# Patient Record
Sex: Female | Born: 1945 | Race: White | Hispanic: No | Marital: Married | Smoking: Former smoker
Health system: Southern US, Community
[De-identification: ages and names within clinical notes are randomized; demographics above are authoritative.]

## PROBLEM LIST (undated history)

## (undated) DIAGNOSIS — K219 Gastro-esophageal reflux disease without esophagitis: Secondary | ICD-10-CM

## (undated) DIAGNOSIS — B001 Herpesviral vesicular dermatitis: Secondary | ICD-10-CM

## (undated) DIAGNOSIS — M549 Dorsalgia, unspecified: Secondary | ICD-10-CM

## (undated) DIAGNOSIS — E663 Overweight: Secondary | ICD-10-CM

## (undated) DIAGNOSIS — Z789 Other specified health status: Secondary | ICD-10-CM

## (undated) HISTORY — DX: Herpesviral vesicular dermatitis: B00.1

## (undated) HISTORY — PX: TONSILLECTOMY: SUR1361

## (undated) HISTORY — DX: Overweight: E66.3

## (undated) HISTORY — DX: Dorsalgia, unspecified: M54.9

---

## 1986-07-02 HISTORY — PX: BUNIONECTOMY: SHX129

## 1998-04-26 ENCOUNTER — Other Ambulatory Visit: Admission: RE | Admit: 1998-04-26 | Discharge: 1998-04-26 | Payer: Self-pay | Admitting: Obstetrics & Gynecology

## 1998-04-27 ENCOUNTER — Other Ambulatory Visit: Admission: RE | Admit: 1998-04-27 | Discharge: 1998-04-27 | Payer: Self-pay | Admitting: Obstetrics & Gynecology

## 1998-05-24 ENCOUNTER — Ambulatory Visit (HOSPITAL_COMMUNITY): Admission: RE | Admit: 1998-05-24 | Discharge: 1998-05-24 | Payer: Self-pay | Admitting: Obstetrics & Gynecology

## 1999-05-30 ENCOUNTER — Other Ambulatory Visit: Admission: RE | Admit: 1999-05-30 | Discharge: 1999-05-30 | Payer: Self-pay | Admitting: Neurology

## 1999-09-27 ENCOUNTER — Other Ambulatory Visit: Admission: RE | Admit: 1999-09-27 | Discharge: 1999-09-27 | Payer: Self-pay | Admitting: Obstetrics & Gynecology

## 1999-09-27 ENCOUNTER — Encounter (INDEPENDENT_AMBULATORY_CARE_PROVIDER_SITE_OTHER): Payer: Self-pay

## 2000-07-16 ENCOUNTER — Other Ambulatory Visit: Admission: RE | Admit: 2000-07-16 | Discharge: 2000-07-16 | Payer: Self-pay | Admitting: Obstetrics & Gynecology

## 2001-09-08 ENCOUNTER — Other Ambulatory Visit: Admission: RE | Admit: 2001-09-08 | Discharge: 2001-09-08 | Payer: Self-pay | Admitting: Obstetrics & Gynecology

## 2003-03-25 ENCOUNTER — Other Ambulatory Visit: Admission: RE | Admit: 2003-03-25 | Discharge: 2003-03-25 | Payer: Self-pay | Admitting: Obstetrics & Gynecology

## 2004-04-12 ENCOUNTER — Other Ambulatory Visit: Admission: RE | Admit: 2004-04-12 | Discharge: 2004-04-12 | Payer: Self-pay | Admitting: Obstetrics & Gynecology

## 2004-07-02 LAB — HM DEXA SCAN: HM Dexa Scan: NORMAL

## 2005-12-07 ENCOUNTER — Ambulatory Visit (HOSPITAL_COMMUNITY): Admission: RE | Admit: 2005-12-07 | Discharge: 2005-12-07 | Payer: Self-pay | Admitting: Gastroenterology

## 2007-07-03 LAB — HM COLONOSCOPY: HM Colonoscopy: NORMAL

## 2008-07-02 HISTORY — PX: OTHER SURGICAL HISTORY: SHX169

## 2009-08-11 ENCOUNTER — Ambulatory Visit: Payer: Self-pay | Admitting: Family Medicine

## 2009-08-11 DIAGNOSIS — M549 Dorsalgia, unspecified: Secondary | ICD-10-CM | POA: Insufficient documentation

## 2010-04-25 ENCOUNTER — Ambulatory Visit: Payer: Self-pay | Admitting: Family Medicine

## 2010-05-10 ENCOUNTER — Ambulatory Visit: Payer: Self-pay | Admitting: Family Medicine

## 2010-06-28 ENCOUNTER — Ambulatory Visit
Admission: RE | Admit: 2010-06-28 | Discharge: 2010-06-28 | Payer: Self-pay | Source: Home / Self Care | Attending: Family Medicine | Admitting: Family Medicine

## 2010-06-28 ENCOUNTER — Encounter: Payer: Self-pay | Admitting: Family Medicine

## 2010-06-28 DIAGNOSIS — R079 Chest pain, unspecified: Secondary | ICD-10-CM | POA: Insufficient documentation

## 2010-06-29 LAB — CONVERTED CEMR LAB
BUN: 13 mg/dL (ref 6–23)
Basophils Relative: 0.6 % (ref 0.0–3.0)
Bilirubin, Direct: 0.1 mg/dL (ref 0.0–0.3)
Calcium: 9.5 mg/dL (ref 8.4–10.5)
Chloride: 107 meq/L (ref 96–112)
Creatinine, Ser: 0.8 mg/dL (ref 0.4–1.2)
Eosinophils Relative: 2.5 % (ref 0.0–5.0)
GFR calc non Af Amer: 81.97 mL/min (ref >60.00)
HCT: 40 % (ref 36.0–46.0)
HDL: 65.4 mg/dL (ref >39.00)
LDL Cholesterol: 93 mg/dL (ref 0–99)
Lymphs Abs: 1.8 10*3/uL (ref 0.7–4.0)
MCV: 92.7 fL (ref 78.0–100.0)
Monocytes Absolute: 0.6 10*3/uL (ref 0.1–1.0)
Monocytes Relative: 8.7 % (ref 3.0–12.0)
RBC: 4.32 M/uL (ref 3.87–5.11)
TSH: 1.58 microintl units/mL (ref 0.35–5.50)
Total Bilirubin: 0.5 mg/dL (ref 0.3–1.2)
Total CHOL/HDL Ratio: 3
Triglycerides: 120 mg/dL (ref 0.0–149.0)
VLDL: 24 mg/dL (ref 0.0–40.0)
WBC: 6.7 10*3/uL (ref 4.5–10.5)

## 2010-08-03 NOTE — Assessment & Plan Note (Signed)
Summary: TOWER FLU SHOT/[facility]  Nurse Visit   Allergies: 1)  ! Penicillin  Orders Added: 1)  Flu Vaccine 71yrs + MEDICARE PATIENTS [Q2039] 2)  Administration Flu vaccine - MCR [G0008]  Flu Vaccine Consent Questions     Do you have a history of severe allergic reactions to this vaccine? no    Any prior history of allergic reactions to egg and/or gelatin? no    Do you have a sensitivity to the preservative Thimersol? no    Do you have a past history of Guillan-Barre Syndrome? no    Do you currently have an acute febrile illness? no    Have you ever had a severe reaction to latex? no    Vaccine information given and explained to patient? yes    Are you currently pregnant? no    Lot Number:[id number]   Exp Date:12/30/2010   Site Given  Left Deltoid IM1

## 2010-08-03 NOTE — Assessment & Plan Note (Signed)
Summary: ?ACID REFLUX/CLE   Vital Signs:  Patient profile:   65 year old female Weight:      146.25 pounds Temp:     97.9 degrees F oral Pulse rate:   74 / minute Pulse rhythm:   regular BP sitting:   136 / 82  (left arm) Cuff size:   regular  Vitals Entered By: Selena Batten Dance CMA Duncan Dull) (June 28, 2010 8:12 AM) CC: ? Acid Reflux, C-V Risk Management   History of Present Illness: CC: ? acid reflux  3wk h/o sxs.  Feels pain under arm as well as pain down arm.  Constant pain, sleeps fine, doesn't bother her through night.  Pain characterized as dull achey.  Currently 2/10.  At worst, 8/10 and can last  ~5 min.  No nausea/vomiting, shortness of breath, sweating.  Tried omeprazole 20mg  daily x 2wks which hasn't really helped.  Hasn't tried tylenol/ibuprofen, etc.  usually pain gets worse with coffee, EtOH.  Not really exertional, not improved by rest.  Prior had similar sxs about 6 yrs ago, told had reflux about 8 yrs ago, usually when stops drinking coffee goes away, this time not.    occasional EtOH.  Over last week, had more wine because went to nightly parties.  No coffee.    Used to run [employer].  told low cholesterol.  bp usually low.  + grandfather died of heart attack at age 82s.  no h/o smoking.  does stay active, goes to gym 3x/wk.  no pain with exercising.  UTD mammo, had one normal this year per [facility].  Cardiovascular Risk History:      Positive major cardiovascular risk factors include female age 36 years old or older.  Negative major cardiovascular risk factors include no history of diabetes or hyperlipidemia, no history of hypertension, negative family history for ischemic heart disease, and non-tobacco-user status.        Further assessment for target organ damage reveals no history of cardiac end-organ damage (CHF/LVH), stroke/TIA, renal insufficiency, or hypertensive retinopathy.     Preventive Screening-Counseling & Management  Alcohol-Tobacco     Smoking Status:  never  Current Medications (verified): 1)  Multivitamins   Tabs (Multiple Vitamin) .... Take One Daily 2)  Vitamin D 1000 Iu .Marland Kitchen.. 1 By Mouth Once Daily  Allergies: 1)  ! Penicillin  Past History:  Past Medical History: Last updated: 08/11/2009 fever blisters  overweight back pain from large breasts     gyn - Dr Aldona Bar   Social History: Last updated: 08/11/2009 married retired Runner, broadcasting/film/video  G1P1 menopause in 2000 used to run marathons in her 102s  goes to gym with a Systems analyst and goes to classes  never smoked  occ alcohol - wine   Family History: mother asthma  died at 43 of lung problems / old age  father died at 13 - brain tumor  no other cancer  MGF died of MI  no DM   Social History: Smoking Status:  never  Review of Systems       per HPI  Physical Exam  General:  Well-developed,well-nourished,in no acute distress; alert,appropriate and cooperative throughout examination Mouth:  pharynx pink and moist.   some post nasal drip no erythema or exudate Neck:  No deformities, masses, or tenderness noted.  no LAD Chest Wall:  No deformities, masses, or tenderness noted.  not reproducible tenderness Lungs:  Normal respiratory effort, chest expands symmetrically. Lungs are clear to auscultation, no crackles or wheezes. Heart:  Normal rate  and regular rhythm. S1 and S2 normal without gallop, murmur, click, rub or other extra sounds. Abdomen:  Bowel sounds positive,abdomen soft and non-tender without masses, organomegaly or hernias noted.  Pulses:  2+ rad pulses, brisk cap refill Extremities:  no pedal edema Skin:  Intact without suspicious lesions or rashes   Impression & Recommendations:  Problem # 1:  CHEST PAIN UNSPECIFIED (ICD-786.50) Actually R axillary pain characterized as dullness.  will check basic blood work to help risk stratify.  doesn't sound cardiac, only risk factor seems to be age.  ? GERD as worse with EtOH.  treat with omeprazole 40mg  two  times a day x 2 wks as previously responded to reflux measures.  reflux precautions discussed.  Advised to call us with update in 2-3 days for response to therapy.  If not improved, refer to cards for stress test.  EKG - NSR at 67, no ST/T changes.  Orders: EKG w/ Interpretation (93000) TLB-Lipid Panel (80061-LIPID) TLB-BMP (Basic Metabolic Panel-BMET) (80048-METABOL) TLB-Hepatic/Liver Function Pnl (80076-HEPATIC) TLB-TSH (Thyroid Stimulating Hormone) (84443-TSH) TLB-CBC Platelet - w/Differential (85025-CBCD)  Complete Medication List: 1)  Multivitamins Tabs (Multiple vitamin) .... Take one daily 2)  Vitamin D 1000 Iu  .Marland Kitchen.. 1 by mouth once daily 3)  Omeprazole 40 Mg Cpdr (Omeprazole) .... Take one by mouth two times a day x 14 days then as needed  Cardiovascular Risk Assessment/Plan:      The patient's hypertensive risk group is category B: At least one risk factor (excluding diabetes) with no target organ damage.  Today's blood pressure is 136/82.  Her blood pressure goal is < 140/90.  Patient Instructions: 1)  This doesn't sound cardiac.  2)  Treat with omeprazole 40mg  twice daily for 2 wks. 3)  Call us with update in 2-3 days if no improvement we will send you to heart doctor. 4)  Reflux precautions:  Head of bed elevated.  Avoidance of citrus, fatty foods, chocolate, peppermint, and excessive alcohol, along with sodas, orange juice (acidic drinks). 5)  At least a few hours between dinner and bed, minimize naps after eating. 6)  Also would try some tylenol to see if any improvement. 7)  Good to meet you today.  Call clinic with questions. Prescriptions: OMEPRAZOLE 40 MG CPDR (OMEPRAZOLE) take one by mouth two times a day x 14 days then as needed  #30 x 1   Entered and Authorized by:   Eustaquio Boyden  MD   Signed by:   Eustaquio Boyden  MD on 06/28/2010   Method used:   Electronically to        [facility] [address] [phone]* (retail)       [address]       [place], [place]  [id number]       Ph: [phone] or [phone]       Fax: [phone]   RxID:   [id number]    Orders Added: 1)  Est. Patient Level IV [40347] 2)  EKG w/ Interpretation [93000] 3)  TLB-Lipid Panel [80061-LIPID] 4)  TLB-BMP (Basic Metabolic Panel-BMET) [80048-METABOL] 5)  TLB-Hepatic/Liver Function Pnl [80076-HEPATIC] 6)  TLB-TSH (Thyroid Stimulating Hormone) [84443-TSH] 7)  TLB-CBC Platelet - w/Differential [85025-CBCD]    Current Allergies (reviewed today): ! PENICILLIN

## 2010-08-03 NOTE — Assessment & Plan Note (Signed)
Summary: cold symptoms   Vital Signs:  Patient profile:   65 year old female Weight:      145 pounds BMI:     28.42 Temp:     98.0 degrees F oral Pulse rate:   80 / minute Pulse rhythm:   regular BP sitting:   130 / 80  (left arm) Cuff size:   regular  Vitals Entered By: Selena Batten Dance CMA Duncan Dull) (April 25, 2010 12:34 PM) CC: Cold symptoms   History of Present Illness: has had cough for over a week -- and is worried because is worse no energy  today a bit better - but yesterday was awful   clear prod cough  no fever  no sob orwheeze   runny nose and stuffy  started wtih st -- better now     Allergies: 1)  ! Penicillin  Past History:  Past Medical History: Last updated: 16-Aug-2009 fever blisters  overweight back pain from large breasts     gyn - Dr Aldona Bar   Past Surgical History: Last updated: 08/16/09 bunion surgery 1988 colon screen 2009-- all normal - re check in 10 years  cataract surgery 2010 dexa 2006 - normal   Family History: Last updated: August 16, 2009 mother asthma  died at 29 of lung problems / old age  father died at 27 - brain tumor  no other cancer  GM died of MI  no DM   Social History: Last updated: 16-Aug-2009 married retired Runner, broadcasting/film/video  G1P1 menopause in 2000 used to run marathons in her 14s  goes to gym with a Systems analyst and goes to classes  never smoked  occ alcohol - wine   Review of Systems General:  Complains of fatigue; denies chills, fever, loss of appetite, and malaise. Eyes:  Denies blurring, discharge, and eye irritation. ENT:  Complains of nasal congestion, postnasal drainage, and sore throat; denies earache and sinus pressure. CV:  Denies chest pain or discomfort and palpitations. Resp:  Complains of cough and sputum productive; denies pleuritic and shortness of breath. GI:  Denies diarrhea, nausea, and vomiting. Derm:  Denies itching, lesion(s), poor wound healing, and rash. Neuro:  Denies numbness and  tingling.  Physical Exam  General:  Well-developed,well-nourished,in no acute distress; alert,appropriate and cooperative throughout examination Head:  normocephalic, atraumatic, and no abnormalities observed.  no sinus tenderness  Eyes:  vision grossly intact, pupils equal, pupils round, and pupils reactive to light.  no conjunctival pallor, injection or icterus  Ears:  R ear normal and L ear normal.   Nose:  nares are boggy and partially congested  Mouth:  pharynx pink and moist.   some post nasal drip no erythema or exudate Neck:  No deformities, masses, or tenderness noted. Lungs:  Normal respiratory effort, chest expands symmetrically. Lungs are clear to auscultation, no crackles or wheezes. Heart:  Normal rate and regular rhythm. S1 and S2 normal without gallop, murmur, click, rub or other extra sounds. Skin:  Intact without suspicious lesions or rashes Cervical Nodes:  No lymphadenopathy noted Psych:  normal affect, talkative and pleasant    Impression & Recommendations:  Problem # 1:  URI (ICD-465.9) Assessment New viral with head and chest congestion - no red flags for bact infx an dB9 exam recommend sympt care- see pt instructions   pt advised to update me if symptoms worsen or do not improve   Complete Medication List: 1)  Multivitamins Tabs (Multiple vitamin) .... Take one daily 2)  Vitamin D 1000 Iu  .Marland KitchenMarland KitchenMarland Kitchen  1 by mouth once daily  Patient Instructions: 1)  you can try mucinex over the counter twice daily as directed and nasal saline spray for congestion 2)  tylenol over the counter as directed may help with aches, headache and fever 3)  call if symptoms worsen or if not improved in 4-5 days  4)  if you develop worse cough/ fever/ shortness of breath- update me please  5)  get a flu shot in about 2 weeks    Orders Added: 1)  Est. Patient Level III [32440]    Current Allergies (reviewed today): ! PENICILLIN

## 2010-08-03 NOTE — Assessment & Plan Note (Signed)
Summary: NEW MEDICARE PATIENT/[facility]   Vital Signs:  Patient profile:   65 year old female Height:      60 inches Weight:      147.25 pounds BMI:     28.86 Temp:     97.6 degrees F oral Pulse rate:   76 / minute Pulse rhythm:   regular BP sitting:   116 / 86  (left arm) Cuff size:   regular  Vitals Entered By: Lewanda Rife LPN (09/09/2009 11:04 AM)  History of Present Illness: is here to establish  used to see Dr Thane Edu for primary care   gyn Dr Aldona Bar -- last march  nl pap   thinks Td was 20 years ago  needs pneumovax - wants to get today   is unsure if she had chicken pox   never had shingles shot  may be interested   used to run marathons in 30s and 40 s no asthma or allergies    takes mvi daily  extra vit D   does get blood work from her gyn  really low cholesterol   wants to loose 10 lb  bothers her back to walk -- large breasts  thinking about breast reduction surgery in future when she looses wt- breast size never changes getting bigger with age shoulders and upper and lower back ache  Allergies (verified): 1)  ! Penicillin  Past History:  Family History: Last updated: 09-09-2009 mother asthma  died at 32 of lung problems / old age  father died at 37 - brain tumor  no other cancer  GM died of MI  no DM   Social History: Last updated: September 09, 2009 married retired Runner, broadcasting/film/video  G1P1 menopause in 2000 used to run marathons in her 50s  goes to gym with a Systems analyst and goes to classes  never smoked  occ alcohol - wine   Past Medical History: fever blisters  overweight back pain from large breasts     gyn - Dr Aldona Bar   Past Surgical History: bunion surgery 1988 colon screen 2009-- all normal - re check in 10 years  cataract surgery 2010 dexa 2006 - normal   Family History: mother asthma  died at 45 of lung problems / old age  father died at 34 - brain tumor  no other cancer  GM died of MI  no DM   Social  History: married retired Runner, broadcasting/film/video  G1P1 menopause in 2000 used to run marathons in her 55s  goes to gym with a Systems analyst and goes to classes  never smoked  occ alcohol - wine   Review of Systems General:  Denies fatigue, fever, loss of appetite, and malaise. Eyes:  Denies blurring and eye irritation. CV:  Denies chest pain or discomfort and lightheadness. Resp:  Denies cough and wheezing. GI:  Denies abdominal pain, bloody stools, and change in bowel habits. GU:  Denies abnormal vaginal bleeding, discharge, and dysuria. MS:  Complains of low back pain, mid back pain, stiffness, and thoracic pain; denies joint redness, joint swelling, and cramps. Derm:  Denies itching, lesion(s), poor wound healing, and rash. Neuro:  Denies numbness, tingling, and weakness. Psych:  mood is ok . Endo:  Denies excessive thirst and excessive urination. Heme:  Denies abnormal bruising and bleeding.  Physical Exam  General:  overweight but generally well appearing  Head:  normocephalic, atraumatic, and no abnormalities observed.   Eyes:  vision grossly intact, pupils equal, pupils round, and pupils reactive to light.  Ears:  R ear normal and L ear normal.   Nose:  no nasal discharge.   Mouth:  pharynx pink and moist.   Neck:  supple with full rom and no masses or thyromegally, no JVD or carotid bruit  Chest Wall:  No deformities, masses, or tenderness noted. Lungs:  Normal respiratory effort, chest expands symmetrically. Lungs are clear to auscultation, no crackles or wheezes. Heart:  Normal rate and regular rhythm. S1 and S2 normal without gallop, murmur, click, rub or other extra sounds. Abdomen:  Bowel sounds positive,abdomen soft and non-tender without masses, organomegaly or hernias noted. Msk:  No deformity or scoliosis noted of thoracic or lumbar spine.  no acute joint changes  LS - no spinal tenderness good rom  Pulses:  R and L carotid,radial,femoral,dorsalis pedis and posterior  tibial pulses are full and equal bilaterally Extremities:  No clubbing, cyanosis, edema, or deformity noted with normal full range of motion of all joints.   Neurologic:  sensation intact to light touch, gait normal, and DTRs symmetrical and normal.   Skin:  Intact without suspicious lesions or rashes Psych:  normal affect, talkative and pleasant    Impression & Recommendations:  Problem # 1:  BACK PAIN (ICD-724.5) Assessment New in physically fit pt -- with large breasts that affect her posture plans to work on weight loss and then consider breast reduction surgery when she is at target weight  this would help quite a bit disc plan for diet and exercise - including core strengthening   Problem # 2:  Preventive Health Care (ICD-V70.0) reviewed med history and prev mt  overall is up to date on her health mt I do recommend calcium and vit D for bone health will either f/u here in spring or at her gyn for annual exam  sent for last labs and note   Complete Medication List: 1)  Multivitamins Tabs (Multiple vitamin) .... Take one daily 2)  Vitamin D 1000 Iu  .Marland Kitchen.. 1 by mouth once daily  Other Orders: TD Toxoids IM 7 YR + (26948) Pneumococcal Vaccine (54627) Admin 1st Vaccine (03500) Admin of Any Addtl Vaccine (93818)  Patient Instructions: 1)  if you are interested in shingles vaccine -- check with your insurance and then call us in summer to schedule  2)  the current recommendation for calcium intake is 1200-1500 mg daily with1000 IU of vitamin D  3)  work on weight loss- I recommend weight watchers  4)  tetnus shot and pneumonia vaccines today  5)  please send for last gyn note and labs from Dr Aldona Bar   Current Allergies (reviewed today): ! PENICILLIN   Preventive Care Screening  Colonoscopy:    Date:  07/03/2007    Next Due:  07/2017    Results:  normal   Mammogram:    Date:  09/10/2008    Results:  normal   Pap Smear:    Date:  08/30/2008    Results:  normal    Bone Density:    Date:  07/02/2004    Results:  normal std dev   Immunizations Administered:  Tetanus Vaccine:    Vaccine Type: Td    Site: left deltoid    Mfr: Sanofi Pasteur    Dose: 0.5 ml    Route: IM    Given by: Lewanda Rife LPN    Exp. Date: 05/17/2011    Lot #: [id number]    VIS given: 05/20/07 version given August 11, 2009.  Pneumonia Vaccine:  Vaccine Type: Pneumovax    Site: right deltoid    Mfr: Merck    Dose: 0.5 ml    Route: IM    Given by: Lewanda Rife LPN    Exp. Date: 10/20/2010    Lot #: [id number]    VIS given: 01/28/96 version given August 11, 2009.

## 2010-11-06 ENCOUNTER — Ambulatory Visit (INDEPENDENT_AMBULATORY_CARE_PROVIDER_SITE_OTHER): Payer: Medicare Other | Admitting: Family Medicine

## 2010-11-06 ENCOUNTER — Encounter: Payer: Self-pay | Admitting: Family Medicine

## 2010-11-06 ENCOUNTER — Ambulatory Visit (INDEPENDENT_AMBULATORY_CARE_PROVIDER_SITE_OTHER)
Admission: RE | Admit: 2010-11-06 | Discharge: 2010-11-06 | Disposition: A | Discharge status: Home / Self Care | Payer: Medicare Other | Source: Ambulatory Visit | Attending: Family Medicine | Admitting: Family Medicine

## 2010-11-06 DIAGNOSIS — M25569 Pain in unspecified knee: Secondary | ICD-10-CM

## 2010-11-06 DIAGNOSIS — M25561 Pain in right knee: Secondary | ICD-10-CM | POA: Insufficient documentation

## 2010-11-06 DIAGNOSIS — M7040 Prepatellar bursitis, unspecified knee: Secondary | ICD-10-CM | POA: Insufficient documentation

## 2010-11-06 NOTE — Progress Notes (Signed)
  Subjective:    Patient ID: Bailey Stephens, female    DOB: 1946-05-01, 65 y.o.   MRN: [id number]  HPI Comments: No history of  knee surgery.  She was a runner in past .occasionally has pain in knee with walking down stairs in past but has never had to see and MD about it.   No history of osteoporosis.. Last DEXA 4 years ago.  Knee Pain  The incident occurred more than 1 week ago (swelling returned 4 days ago). The incident occurred at home (tripped on stool going into house). The injury mechanism was a direct blow. The pain is present in the right knee. The quality of the pain is described as shooting (Initially iced knee...improved pain but after she went back to regualr eactivities she had swelling return). The pain is at a severity of 3/10 (Pain only occurs with touching not with moving /bending). The pain is mild. Pain course: only to palpation. Pertinent negatives include no inability to bear weight, loss of motion, loss of sensation, muscle weakness, numbness or tingling. She has tried ice, NSAIDs and rest for the symptoms. The treatment provided moderate relief.      Review of Systems  Constitutional: Negative for fever and fatigue.  HENT: Negative for ear pain.   Eyes: Negative for pain.  Respiratory: Negative for shortness of breath and wheezing.   Cardiovascular: Negative for chest pain, palpitations and leg swelling.  Gastrointestinal: Negative for abdominal pain, diarrhea, constipation and blood in stool.  Musculoskeletal: Negative for myalgias, back pain and gait problem.  Neurological: Negative for tingling and numbness.       Objective:   Physical Exam  Constitutional: She appears well-developed and well-nourished. No distress.  HENT:  Head: Normocephalic and atraumatic.  Right Ear: External ear normal.  Left Ear: External ear normal.  Nose: Nose normal.  Mouth/Throat: Oropharynx is clear and moist.  Eyes: Pupils are equal, round, and reactive to light.  Neck:  Normal range of motion. Neck supple.  Cardiovascular: Normal rate, regular rhythm, normal heart sounds and intact distal pulses.  Exam reveals no gallop and no friction rub.   No murmur heard. Pulmonary/Chest: Effort normal and breath sounds normal. No respiratory distress. She has no wheezes. She has no rales. She exhibits no tenderness.  Musculoskeletal: Normal range of motion. She exhibits edema and tenderness.       Right knee: She exhibits swelling, effusion and deformity. She exhibits normal range of motion, no erythema, normal patellar mobility and normal meniscus. tenderness found. No medial joint line, no lateral joint line, no MCL and no LCL tenderness noted.       Left knee: Normal.       Swelling in prepatellar region...deformity in anterior knee due to baseballsize,soft, fluid filled area anterior to patella Minimal pain to palpation Neg McMurray's, neg ant/post drawer  Skin: She is not diaphoretic.          Assessment & Plan:

## 2010-11-06 NOTE — Assessment & Plan Note (Signed)
X-ray shows no suggestion pf fracture.  Physical exam is most consistent with prepatellar bursitis.Fluid appears to be above patella as opposed to intra-articular. Bursitis likely due to direct blow to bursa.. No suggestion of infection or gout. She will start NSAIDs, Ice and elvation of leg. She will return for aspiration of bursa, fluid analysis and possibel steroid injection with Dr. Patsy Lager in 2 days when he is next in the office.

## 2010-11-06 NOTE — Patient Instructions (Signed)
Start aleve every 12 hours.  Elevate right leg above heart when able.  Ice knee.  We will call you with x-ray results.

## 2010-11-08 ENCOUNTER — Ambulatory Visit (INDEPENDENT_AMBULATORY_CARE_PROVIDER_SITE_OTHER): Payer: Medicare Other | Admitting: Family Medicine

## 2010-11-08 ENCOUNTER — Encounter: Payer: Self-pay | Admitting: Family Medicine

## 2010-11-08 DIAGNOSIS — M7040 Prepatellar bursitis, unspecified knee: Secondary | ICD-10-CM

## 2010-11-08 DIAGNOSIS — M25561 Pain in right knee: Secondary | ICD-10-CM

## 2010-11-08 DIAGNOSIS — M25569 Pain in unspecified knee: Secondary | ICD-10-CM

## 2010-11-08 NOTE — Progress Notes (Signed)
Patient presents with 10 day h/o R swelling after no occult injury that she can recall. No audible pop was heard. The patient has a massive prepatellar effusion. No symptomatic giving-way. No mechanical clicking. Joint has not locked up. Patient has been able to walk. The patient does not have pain going up and down stairs or rising from a seated position.   Pain location: no pain Prior Knee Surgery: none Current pain meds: none Bracing: none  The PMH, PSH, Social History, Family History, Medications, and allergies have been reviewed in [facility], and have been updated if relevant.  GEN: Well-developed,well-nourished,in no acute distress; alert,appropriate and cooperative throughout examination HEENT: Normocephalic and atraumatic without obvious abnormalities. Ears, externally no deformities PULM: Breathing comfortably in no respiratory distress EXT: No clubbing, cyanosis, or edema PSYCH: Normally interactive. Cooperative during the interview. Pleasant. Friendly and conversant. Not anxious or depressed appearing. Normal, full affect.  Gait: Normal heel toe pattern ROM: 0 - 120 Effusion: massive prepatellar vs sq effusion prepatellar bursa and anterior knee Echymosis or edema: none Patellar tendon NT Painful PLICA: neg Patellar grind: negative Medial and lateral patellar facet loading: negative medial and lateral joint lines:NT Mcmurray's neg Flexion-pinch neg Varus and valgus stress: stable Lachman: neg Ant and Post drawer: neg Hip abduction, IR, ER: WNL Hip flexion str: 5/5 Hip abd: 5/5 Quad: 5/5 VMO atrophy:No Hamstring concentric and eccentric: 5/5  A/P: Knee Pain, Massive prepatellar bursa. Given the volume of fluid, blood associated, I suspect that the patient probably ruptured her prepatellar bursa. 200 cc of fluid is over and above what can be expected in the prepatellar bursa, and fluid throughout anterior soft tissue.  Knee Aspiration and Injection Patient verbally  consented; risks, benefits, and alternatives explained. Patient prepped with betadine x 3. Ethyl chloride for anesthesia. 18 gauge needle used via lateral approach in prepatellar bursa to aspirate 200 cc of bloody fluid. No subsequent injection performed. Tolerated well, decreased pain, no complications.  Placed in ACE wrap for compression Call immediately if redness worsens.   Sent fluid for cell count, gram stain, culture, synovial panel.

## 2010-11-08 NOTE — Patient Instructions (Signed)
Wear a NEOPRENE SLEEVE (NO HOLE) WEAR ALL THE TIME EXCEPT BATHING FOR 1 WEEK, THEN WEAR WITH ACTIVITY  ICE TO knee over the next few days

## 2010-11-12 LAB — BODY FLUID CULTURE: Organism ID, Bacteria: NO GROWTH

## 2010-11-19 ENCOUNTER — Inpatient Hospital Stay (INDEPENDENT_AMBULATORY_CARE_PROVIDER_SITE_OTHER)
Admission: RE | Admit: 2010-11-19 | Discharge: 2010-11-19 | Disposition: A | Discharge status: Home / Self Care | Payer: Medicare Other | Source: Ambulatory Visit | Attending: Emergency Medicine | Admitting: Emergency Medicine

## 2010-11-19 DIAGNOSIS — L255 Unspecified contact dermatitis due to plants, except food: Secondary | ICD-10-CM

## 2011-01-18 ENCOUNTER — Other Ambulatory Visit: Payer: Self-pay | Admitting: Family Medicine

## 2011-01-18 DIAGNOSIS — Z1231 Encounter for screening mammogram for malignant neoplasm of breast: Secondary | ICD-10-CM

## 2011-01-26 ENCOUNTER — Ambulatory Visit
Admission: RE | Admit: 2011-01-26 | Discharge: 2011-01-26 | Disposition: A | Discharge status: Home / Self Care | Payer: Medicare Other | Source: Ambulatory Visit | Attending: Family Medicine | Admitting: Family Medicine

## 2011-01-26 DIAGNOSIS — Z1231 Encounter for screening mammogram for malignant neoplasm of breast: Secondary | ICD-10-CM

## 2011-01-26 LAB — HM MAMMOGRAPHY: HM Mammogram: NORMAL

## 2011-02-10 ENCOUNTER — Telehealth: Payer: Self-pay | Admitting: Family Medicine

## 2011-02-10 DIAGNOSIS — Z0000 Encounter for general adult medical examination without abnormal findings: Secondary | ICD-10-CM

## 2011-02-10 DIAGNOSIS — Z131 Encounter for screening for diabetes mellitus: Secondary | ICD-10-CM

## 2011-02-10 DIAGNOSIS — Z13220 Encounter for screening for lipoid disorders: Secondary | ICD-10-CM

## 2011-02-10 DIAGNOSIS — K219 Gastro-esophageal reflux disease without esophagitis: Secondary | ICD-10-CM

## 2011-02-10 NOTE — Telephone Encounter (Signed)
Message copied by Judy Pimple on Sat Feb 10, 2011 10:05 PM ------      Message from: Bailey Stephens      Created: Wed Feb 07, 2011 11:01 AM      Regarding: cpx labs wed 8/15       Please order  future cpx labs for pt's upcomming lab appt.      Thanks      Rodney Booze

## 2011-02-10 NOTE — Telephone Encounter (Signed)
I was going to order future labs on this pt for wed 8/15- but ? If she has been abstracted  Could you check on that?-thanks

## 2011-02-12 ENCOUNTER — Encounter: Payer: Self-pay | Admitting: Family Medicine

## 2011-02-12 ENCOUNTER — Telehealth: Payer: Self-pay

## 2011-02-12 DIAGNOSIS — Z13220 Encounter for screening for lipoid disorders: Secondary | ICD-10-CM | POA: Insufficient documentation

## 2011-02-12 DIAGNOSIS — Z0000 Encounter for general adult medical examination without abnormal findings: Secondary | ICD-10-CM | POA: Insufficient documentation

## 2011-02-12 DIAGNOSIS — Z131 Encounter for screening for diabetes mellitus: Secondary | ICD-10-CM | POA: Insufficient documentation

## 2011-02-12 DIAGNOSIS — K219 Gastro-esophageal reflux disease without esophagitis: Secondary | ICD-10-CM | POA: Insufficient documentation

## 2011-02-12 NOTE — Telephone Encounter (Signed)
I did abstraction this AM. Thank you.

## 2011-02-12 NOTE — Telephone Encounter (Signed)
Patient notified as instructed by telephone. Pt said she would like to wait until her CPX 03/02/11 to discuss with Dr Milinda Antis. Lab appt was cancelled.

## 2011-02-12 NOTE — Telephone Encounter (Signed)
Message copied by Patience Musca on Mon Feb 12, 2011  3:50 PM ------      Message from: Roxy Manns A      Created: Mon Feb 12, 2011  1:49 PM       Please see note on her regarding future lab order -- it would not let me route message to you       Bottom line is -- it does not look like her insurance will pay for wellness labs as I tried to order them       ? Does she want to still do them and pay out of pocket or talk about it at our visit )      I was going to do cmet, cbc with diff, tsh and lipids      thanks

## 2011-02-12 NOTE — Telephone Encounter (Signed)
Please let pt know that I am trying to order regular wellness labs for her next visit - but they are being shown as not covered by her insurance Perhaps we should talk about it before we do her labs (or maybe she wants to pay out of pocket) Please ask her what she thinks - and I went ahead and removed the lab orders for now

## 2011-02-14 ENCOUNTER — Other Ambulatory Visit: Payer: Medicare Other

## 2011-02-21 ENCOUNTER — Encounter: Payer: Medicare Other | Admitting: Family Medicine

## 2011-03-02 ENCOUNTER — Encounter: Payer: Medicare Other | Admitting: Family Medicine

## 2011-05-01 ENCOUNTER — Ambulatory Visit (INDEPENDENT_AMBULATORY_CARE_PROVIDER_SITE_OTHER): Payer: Medicare Other | Admitting: Family Medicine

## 2011-05-01 ENCOUNTER — Encounter: Payer: Self-pay | Admitting: Family Medicine

## 2011-05-01 VITALS — BP 126/82 | HR 80 | Temp 98.1°F | Ht 62.0 in | Wt 138.5 lb

## 2011-05-01 DIAGNOSIS — N952 Postmenopausal atrophic vaginitis: Secondary | ICD-10-CM | POA: Insufficient documentation

## 2011-05-01 DIAGNOSIS — N899 Noninflammatory disorder of vagina, unspecified: Secondary | ICD-10-CM

## 2011-05-01 DIAGNOSIS — R229 Localized swelling, mass and lump, unspecified: Secondary | ICD-10-CM

## 2011-05-01 DIAGNOSIS — Z13220 Encounter for screening for lipoid disorders: Secondary | ICD-10-CM

## 2011-05-01 DIAGNOSIS — N898 Other specified noninflammatory disorders of vagina: Secondary | ICD-10-CM

## 2011-05-01 DIAGNOSIS — Z131 Encounter for screening for diabetes mellitus: Secondary | ICD-10-CM

## 2011-05-01 DIAGNOSIS — Z23 Encounter for immunization: Secondary | ICD-10-CM

## 2011-05-01 DIAGNOSIS — R2230 Localized swelling, mass and lump, unspecified upper limb: Secondary | ICD-10-CM

## 2011-05-01 DIAGNOSIS — K219 Gastro-esophageal reflux disease without esophagitis: Secondary | ICD-10-CM

## 2011-05-01 MED ORDER — ESTRADIOL 0.1 MG/GM VA CREA: TOPICAL_CREAM | VAGINAL | Status: DC

## 2011-05-01 NOTE — Assessment & Plan Note (Signed)
Reviewed very good cholesterol from last dec Disc goals for lipids and reasons to control them Rev labs with pt Rev low sat fat diet in detail

## 2011-05-01 NOTE — Patient Instructions (Addendum)
If you are interested in zoster vaccine (shingles) , call your insurance company about coverage, then call our office and tell us what pharmacy to send the px for the vaccine to (do not get within 1 month of other vaccines)  Flu shot today  Will do a px for estrace cream for atrophic vaginitis  We will do a hand specialist referral at check out for the lump on your thumb  Keep up the great diet and exercise  Schedule non fasting labs for January  Try to get 1200-1500 mg of calcium per day with at least 1000 iu of vitamin D - for bone health

## 2011-05-01 NOTE — Assessment & Plan Note (Signed)
Unsure if poss cyst vs scar tissue or other mass on R thumb No hx of fb Ref to hand specialist for further eval

## 2011-05-01 NOTE — Assessment & Plan Note (Signed)
Nl sugar in last labs Will re check this in labs in January Good diet and exercise

## 2011-05-01 NOTE — Progress Notes (Signed)
Subjective:    Patient ID: Bailey Stephens, female    DOB: [birth year], 65 y.o.   MRN: [id number]  HPI Here for annual check up of chronic medical problems and also to review her health mt list   Has had some vaginal irritation -- unsure if related to soap No discharge - occ itch - not severe  Going on about 3-4 weeks  Does not use douche - does use lubricants occasionally  Used to use a vaginal cream -- ran out of that - used twice per week  Also strange knot on R thumb   Since last visit- fell on knee and had to have a prepatellar bursitis drained by Dr Patsy Lager  Is just about better now  Also had a bad poison ivy attack   Good bp at 126/82  Wt is down 9 lb with good bmi of 25 Is thrilled with that  Went on the [employer] - not that difficult  Only drinks alcohol - 1 drink on sat nt Gained 3 lb on a cruise    Last labs in 12/11 were good  Lipoid screen  Lab Results  Component Value Date   CHOL 182 06/28/2010   Lab Results  Component Value Date   HDL 65.40 06/28/2010   Lab Results  Component Value Date   LDLCALC 93 06/28/2010   Lab Results  Component Value Date   TRIG 120.0 06/28/2010   Lab Results  Component Value Date   CHOLHDL 3 06/28/2010   No results found for this basename: LDLDIRECT   good LDL and HDL  Diet - is excellent - and proud of that  Eats cereal hi fiber/ or 1 egg/ whole wheat toast Pita- onions / pepper  Roasted peppers and asparagus - with olive oil  Eats some hummus and flank steak occas/ mostly chicken  Lot of fish   Exercise- sticks with that - [employer] 3 days per week and walks 3 miles daily also  Used to run marathons   Mam nl 7/12 normal  Self exam- no lumps or changes   Zoster status- has not had disease or vaccine  Unsure if she had chicken pox    GERD- on prilosec  Flu shot- will get that today  Td was 2/11 PTx was 2011  Pap 2010- was normal  No abn paps in 30 years  No new sexual partners    dexa  06-- was normal  Is taking her ca and vit D   Patient Active Problem List  Diagnoses  . BACK PAIN  . CHEST PAIN UNSPECIFIED  . Right knee pain  . Prepatellar bursitis  . Routine general medical examination at a health care facility  . Screening for lipoid disorders  . Diabetes mellitus screening  . GERD (gastroesophageal reflux disease)  . Lump on finger  . Vaginal irritation   Past Medical History  Diagnosis Date  . Fever blister   . Overweight   . Back pain     due to large breasts   Past Surgical History  Procedure Date  . Bunionectomy 1988  . Cataract surgery 2010   History  Substance Use Topics  . Smoking status: Former Games developer  . Smokeless tobacco: Never Used   Comment: quit 1976  . Alcohol Use: Yes     occasional wine/alcohol   Family History  Problem Relation Age of Onset  . Asthma Mother   . Cancer Father     brain tumor  . Heart disease  Maternal Grandfather     MI   Allergies  Allergen Reactions  . Penicillins     REACTION: Pt said not sure if she was allergic  to penicillin. Pt said 40 + years ago was told allergic to penicillin. Not sure what type reaction.   Current Outpatient Prescriptions on File Prior to Visit  Medication Sig Dispense Refill  . Cholecalciferol (VITAMIN D) 1000 UNITS capsule Take 1,000 Units by mouth daily.        . Multiple Vitamin (MULTIVITAMIN) tablet Take 1 tablet by mouth daily.        Marland Kitchen omeprazole (PRILOSEC) 40 MG capsule Take 40 mg by mouth daily as needed.              Review of Systems Review of Systems  Constitutional: Negative for fever, appetite change, fatigue and unexpected weight change.  Eyes: Negative for pain and visual disturbance.  Respiratory: Negative for cough and shortness of breath.   Cardiovascular: Negative for cp or palpitations    Gastrointestinal: Negative for nausea, diarrhea and constipation.  Genitourinary: Negative for urgency and frequency. pos for vaginal irritation but neg for  discharge  Skin: Negative for pallor or rash   Neurological: Negative for weakness, light-headedness, numbness and headaches.  Hematological: Negative for adenopathy. Does not bruise/bleed easily.  Psychiatric/Behavioral: Negative for dysphoric mood. The patient is not nervous/anxious.          Objective:   Physical Exam  Constitutional: She appears well-developed and well-nourished. No distress.  HENT:  Head: Normocephalic and atraumatic.  Right Ear: External ear normal.  Left Ear: External ear normal.  Nose: Nose normal.  Mouth/Throat: Oropharynx is clear and moist.  Eyes: Conjunctivae and EOM are normal. Pupils are equal, round, and reactive to light. No scleral icterus.  Neck: Normal range of motion. Neck supple. No JVD present. Carotid bruit is not present. No thyromegaly present.  Cardiovascular: Normal rate, regular rhythm, normal heart sounds and intact distal pulses.  Exam reveals no gallop.   Pulmonary/Chest: Effort normal and breath sounds normal. No respiratory distress. She has no wheezes.  Abdominal: Soft. Bowel sounds are normal. She exhibits no distension, no abdominal bruit and no mass. There is no tenderness.  Genitourinary: Vagina normal and uterus normal. No breast swelling, tenderness, discharge or bleeding. No vaginal discharge found.       Atrophic/ dry vaginal mucosa noted Wet prep obt- negative   Musculoskeletal: Normal range of motion. She exhibits no edema and no tenderness.  Lymphadenopathy:    She has no cervical adenopathy.  Neurological: She is alert. She has normal reflexes. No cranial nerve deficit. She exhibits normal muscle tone. Coordination normal.  Skin: Skin is warm and dry. No rash noted. No erythema. No pallor.       Firm lump that is slightly mobile near tip of L thumb nontender No erythema or drainage   Psychiatric: She has a normal mood and affect.          Assessment & Plan:

## 2011-05-01 NOTE — Assessment & Plan Note (Signed)
Overall improved - with ppi just prn  Disc diet  Will check cbc at [facility] lab appt

## 2011-05-01 NOTE — Assessment & Plan Note (Signed)
Nl wet prep today Suspect atrophic vaginitis Exam done without pap  Will tx with estrace cream twice weekly and update

## 2011-05-02 ENCOUNTER — Encounter: Payer: Medicare Other | Admitting: Family Medicine

## 2011-05-31 ENCOUNTER — Other Ambulatory Visit: Payer: Self-pay | Admitting: Orthopedic Surgery

## 2011-06-07 ENCOUNTER — Encounter (HOSPITAL_BASED_OUTPATIENT_CLINIC_OR_DEPARTMENT_OTHER): Payer: Self-pay | Admitting: *Deleted

## 2011-06-07 NOTE — Progress Notes (Signed)
Had ekg dr tower 10/12 No labs needed Arrive 615am

## 2011-06-08 ENCOUNTER — Encounter (HOSPITAL_BASED_OUTPATIENT_CLINIC_OR_DEPARTMENT_OTHER): Payer: Self-pay | Admitting: *Deleted

## 2011-06-08 ENCOUNTER — Ambulatory Visit (HOSPITAL_BASED_OUTPATIENT_CLINIC_OR_DEPARTMENT_OTHER)
Admission: RE | Admit: 2011-06-08 | Discharge: 2011-06-08 | Disposition: A | Discharge status: Home Health | Payer: Medicare Other | Source: Ambulatory Visit | Attending: Orthopedic Surgery | Admitting: Orthopedic Surgery

## 2011-06-08 ENCOUNTER — Encounter (HOSPITAL_BASED_OUTPATIENT_CLINIC_OR_DEPARTMENT_OTHER): Payer: Self-pay | Admitting: Certified Registered"

## 2011-06-08 ENCOUNTER — Other Ambulatory Visit: Payer: Self-pay | Admitting: Orthopedic Surgery

## 2011-06-08 ENCOUNTER — Encounter (HOSPITAL_BASED_OUTPATIENT_CLINIC_OR_DEPARTMENT_OTHER): Payer: Self-pay | Admitting: Orthopedic Surgery

## 2011-06-08 ENCOUNTER — Encounter (HOSPITAL_BASED_OUTPATIENT_CLINIC_OR_DEPARTMENT_OTHER)
Admission: RE | Disposition: A | Discharge status: Home Health | Payer: Self-pay | Source: Ambulatory Visit | Attending: Orthopedic Surgery

## 2011-06-08 ENCOUNTER — Ambulatory Visit (HOSPITAL_BASED_OUTPATIENT_CLINIC_OR_DEPARTMENT_OTHER): Payer: Medicare Other | Admitting: Certified Registered"

## 2011-06-08 DIAGNOSIS — K219 Gastro-esophageal reflux disease without esophagitis: Secondary | ICD-10-CM | POA: Insufficient documentation

## 2011-06-08 DIAGNOSIS — D2110 Benign neoplasm of connective and other soft tissue of unspecified upper limb, including shoulder: Secondary | ICD-10-CM | POA: Insufficient documentation

## 2011-06-08 HISTORY — DX: Other specified health status: Z78.9

## 2011-06-08 HISTORY — DX: Gastro-esophageal reflux disease without esophagitis: K21.9

## 2011-06-08 HISTORY — PX: MASS EXCISION: SHX2000

## 2011-06-08 LAB — POCT HEMOGLOBIN-HEMACUE: Hemoglobin: 13.5 g/dL (ref 12.0–15.0)

## 2011-06-08 SURGERY — EXCISION MASS
Anesthesia: Monitor Anesthesia Care | Laterality: Right

## 2011-06-08 MED ORDER — PROMETHAZINE HCL 25 MG/ML IJ SOLN: 6.2500 mg | INTRAMUSCULAR | Status: DC | PRN

## 2011-06-08 MED ORDER — LACTATED RINGERS IV SOLN
INTRAVENOUS | Status: DC
  Administered 2011-06-08: 07:00:00 via INTRAVENOUS

## 2011-06-08 MED ORDER — MIDAZOLAM HCL 5 MG/5ML IJ SOLN
INTRAMUSCULAR | Status: DC | PRN
  Administered 2011-06-08: 1 mg via INTRAVENOUS

## 2011-06-08 MED ORDER — TRAMADOL HCL 50 MG PO TABS: ORAL_TABLET | ORAL | Status: AC

## 2011-06-08 MED ORDER — MEPERIDINE HCL 25 MG/ML IJ SOLN: 6.2500 mg | INTRAMUSCULAR | Status: DC | PRN

## 2011-06-08 MED ORDER — LIDOCAINE HCL 2 % IJ SOLN
INTRAMUSCULAR | Status: DC | PRN
  Administered 2011-06-08: 3.5 mL

## 2011-06-08 MED ORDER — PROPOFOL 10 MG/ML IV EMUL
INTRAVENOUS | Status: DC | PRN
  Administered 2011-06-08: 40 ug/kg/min via INTRAVENOUS

## 2011-06-08 MED ORDER — HYDROMORPHONE HCL PF 1 MG/ML IJ SOLN: 0.2500 mg | INTRAMUSCULAR | Status: DC | PRN

## 2011-06-08 MED ORDER — CHLORHEXIDINE GLUCONATE 4 % EX LIQD: 60.0000 mL | Freq: Once | CUTANEOUS | Status: DC

## 2011-06-08 MED ORDER — ONDANSETRON HCL 4 MG/2ML IJ SOLN
INTRAMUSCULAR | Status: DC | PRN
  Administered 2011-06-08: 4 mg via INTRAVENOUS

## 2011-06-08 MED ORDER — VANCOMYCIN HCL IN DEXTROSE 1-5 GM/200ML-% IV SOLN: 1000.0000 mg | Freq: Once | INTRAVENOUS | Status: DC

## 2011-06-08 MED ORDER — MIDAZOLAM HCL 2 MG/2ML IJ SOLN: 0.5000 mg | Freq: Once | INTRAMUSCULAR | Status: DC | PRN

## 2011-06-08 MED ORDER — MORPHINE SULFATE 2 MG/ML IJ SOLN: 0.0500 mg/kg | INTRAMUSCULAR | Status: DC | PRN

## 2011-06-08 MED ORDER — FENTANYL CITRATE 0.05 MG/ML IJ SOLN
INTRAMUSCULAR | Status: DC | PRN
  Administered 2011-06-08: 50 ug via INTRAVENOUS

## 2011-06-08 SURGICAL SUPPLY — 50 items
BANDAGE ADHESIVE 1X3 (GAUZE/BANDAGES/DRESSINGS) IMPLANT
BANDAGE ELASTIC 3 VELCRO ST LF (GAUZE/BANDAGES/DRESSINGS) IMPLANT
BLADE MINI RND TIP GREEN BEAV (BLADE) IMPLANT
BLADE SURG 15 STRL LF DISP TIS (BLADE) ×1 IMPLANT
BLADE SURG 15 STRL SS (BLADE) ×2
BNDG CMPR 9X4 STRL LF SNTH (GAUZE/BANDAGES/DRESSINGS)
BNDG CMPR MD 5X2 ELC HKLP STRL (GAUZE/BANDAGES/DRESSINGS)
BNDG COHESIVE 1X5 TAN STRL LF (GAUZE/BANDAGES/DRESSINGS) ×2 IMPLANT
BNDG ELASTIC 2 VLCR STRL LF (GAUZE/BANDAGES/DRESSINGS) IMPLANT
BNDG ESMARK 4X9 LF (GAUZE/BANDAGES/DRESSINGS) IMPLANT
BRUSH SCRUB EZ PLAIN DRY (MISCELLANEOUS) ×2 IMPLANT
CLOTH BEACON ORANGE TIMEOUT ST (SAFETY) ×2 IMPLANT
CORDS BIPOLAR (ELECTRODE) IMPLANT
COVER MAYO STAND STRL (DRAPES) ×2 IMPLANT
COVER TABLE BACK 60X90 (DRAPES) ×2 IMPLANT
CUFF TOURNIQUET SINGLE 18IN (TOURNIQUET CUFF) IMPLANT
DECANTER SPIKE VIAL GLASS SM (MISCELLANEOUS) IMPLANT
DRAIN PENROSE 1/2X12 LTX STRL (WOUND CARE) IMPLANT
DRAIN PENROSE 1/4X12 LTX STRL (WOUND CARE) IMPLANT
DRAPE EXTREMITY T 121X128X90 (DRAPE) ×2 IMPLANT
DRAPE SURG 17X23 STRL (DRAPES) ×2 IMPLANT
GAUZE XEROFORM 1X8 LF (GAUZE/BANDAGES/DRESSINGS) IMPLANT
GLOVE BIOGEL PI IND STRL 8 (GLOVE) ×1 IMPLANT
GLOVE BIOGEL PI INDICATOR 8 (GLOVE) ×1
GLOVE ORTHO TXT STRL SZ7.5 (GLOVE) ×2 IMPLANT
GOWN PREVENTION PLUS XLARGE (GOWN DISPOSABLE) ×2 IMPLANT
GOWN STRL REIN XL XLG (GOWN DISPOSABLE) ×4 IMPLANT
NDL BLUNT 17GA (NEEDLE) ×1 IMPLANT
NEEDLE 27GAX1X1/2 (NEEDLE) IMPLANT
NEEDLE BLUNT 17GA (NEEDLE) ×2 IMPLANT
PACK BASIN DAY SURGERY FS (CUSTOM PROCEDURE TRAY) ×2 IMPLANT
PAD CAST 3X4 CTTN HI CHSV (CAST SUPPLIES) IMPLANT
PADDING CAST COTTON 3X4 STRL (CAST SUPPLIES)
PADDING UNDERCAST 2  STERILE (CAST SUPPLIES) IMPLANT
SPLINT PLASTER CAST XFAST 3X15 (CAST SUPPLIES) IMPLANT
SPLINT PLASTER XTRA FASTSET 3X (CAST SUPPLIES)
SPONGE GAUZE 4X4 12PLY (GAUZE/BANDAGES/DRESSINGS) IMPLANT
STOCKINETTE 4X48 STRL (DRAPES) ×2 IMPLANT
STRIP CLOSURE SKIN 1/2X4 (GAUZE/BANDAGES/DRESSINGS) IMPLANT
SUT ETHILON 5 0 P 3 18 (SUTURE) ×1
SUT MERSILENE 4 0 P 3 (SUTURE) IMPLANT
SUT NYLON ETHILON 5-0 P-3 1X18 (SUTURE) ×1 IMPLANT
SUT PROLENE 3 0 PS 2 (SUTURE) IMPLANT
SYR 20CC LL (SYRINGE) ×2 IMPLANT
SYR 3ML 23GX1 SAFETY (SYRINGE) IMPLANT
SYR CONTROL 10ML LL (SYRINGE) IMPLANT
TOWEL OR 17X24 6PK STRL BLUE (TOWEL DISPOSABLE) ×2 IMPLANT
TRAY DSU PREP LF (CUSTOM PROCEDURE TRAY) ×2 IMPLANT
UNDERPAD 30X30 INCONTINENT (UNDERPADS AND DIAPERS) ×2 IMPLANT
WATER STERILE IRR 1000ML POUR (IV SOLUTION) IMPLANT

## 2011-06-08 NOTE — Op Note (Signed)
OP NOTE DICTATED 06/08/11 [id number]

## 2011-06-08 NOTE — Op Note (Signed)
NAME:  Bailey Stephens, Bailey Stephens NO.:  MEDICAL RECORD NO.:  [id number]  LOCATION:                                 FACILITY:  PHYSICIAN:  Bailey Fitch. Tyjanae Bartek, M.D.      DATE OF BIRTH:  DATE OF PROCEDURE:  06/08/2011 DATE OF DISCHARGE:                              OPERATIVE REPORT   PREOPERATIVE DIAGNOSIS:  Osteocartilaginous mass, right thumb, rule out osteochondroma or chondroma.  PREOPERATIVE DIAGNOSIS:  Osteocartilaginous mass, right thumb, rule out osteochondroma or chondroma.  OPERATION:  Resection of right thumb distal phalangeal osteocartilaginous mass with reduction of the thumb skin.  OPERATING SURGEON:  Bailey Fitch. Ahrianna Siglin, MD  ASSISTANT:  Marveen Reeks Dasnoit, PAC  ANESTHESIA:  2% lidocaine, metacarpal head level block, right thumb, supplemented by IV sedation.  SUPERVISING ANESTHESIOLOGIST:  Judie Petit, MD  INDICATIONS:  Bailey Stephens is a 65 year old woman referred for evaluation and management of a painful mass in the pulp of her thumb. Clinical examination revealed a firm nodule that could represent an epidermal inclusion cyst or possible osteochondroma.  Plain x-rays revealed a turret exostosis of the distal phalanx, suggesting that this was an osteochondroma.  Bailey Stephens requested that we remove this so that she would have less uncomfortable pinch.  We advised that we could proceed with excision for diagnosis and hopeful resolution of her problem.  Risks of surgery include potential infection, failure to relieve all her pain, and the possibility she could develop recurrent mass.  The anticipated outcome of surgery would be decreased mass and decreased discomfort.  She will have a sensitive thumb pulp for period of time due dissection and retraction of the digital nerves.  After informed consent, she was brought to the operating room at this time.  PROCEDURE:  Bailey Stephens was brought to room 1 of the [facility] and  placed in supine position on the operating table.  Following anesthesia and informed consent, sedation was provided, followed by Betadine prep of the thumb.  A 3.5 mL of 2% lidocaine were infiltrated at metacarpal head level to obtain a digital block.  When anesthesia was satisfactory, we proceeded to scrub and paint the right upper extremity with Betadine and apply a pneumatic tourniquet to the proximal right brachium.  Following routine surgical time-out, the arm was exsanguinated with Esmarch bandage and the arterial tourniquet was inflated to 220 mmHg. Procedure commenced with an elliptical skin excision of the mass to prevent redundant skin following removal of the mass, which acted as a tissue expander.  Subcutaneous tissue dissection revealed terminal branches of the ulnar propositional nerve, which were gently dissected off the mass with a Therapist, nutritional.  The base of the mass was identified and a 4-mm osteotome was used to amputate the mass off the distal phalanx proper.  A small rongeur was used to smooth the margins of resection.  The mass was osteocartilaginous.  This was placed in formalin for pathologic evaluation.  We will await the results of a detailed pathologic evaluation prior to signing a final diagnosis.  The wound was then repaired with trauma sutures of 5-0 nylon.  A compressive dressing was applied with Xeroflo, sterile  gauze, and Coban. There were no apparent complications.  Note for aftercare, she is provided tramadol 50 mg 1-2 tablets p.o. q.4- 6 h. p.r.n. pain, 20 tablets without refill.     Bailey Stephens, M.D.     RVS/MEDQ  D:  06/08/2011  T:  06/08/2011  Job:  [id number]

## 2011-06-08 NOTE — Brief Op Note (Signed)
06/08/2011  8:09 AM  PATIENT:  Vira Browns  65 y.o. 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

## 2011-06-08 NOTE — H&P (Signed)
  May 30, 2011     Roxy Manns, MD [address] [place], [place] [address] FAX # [phone]  RE: Makilah Dowda  DOB:   [birth year]  MEDICARE   Dear Idamae Schuller:  Thanks for sending Vito Backers for consult regarding the mass on the ulnar aspect of her right thumb pulp.  Millie noted this bump four months prior.  She has no prior history of penetrating injury.  She is an avid gardener. She is a retired history Runner, broadcasting/film/video at [employer].  She now presents for an upper extremity evaluation.    Her past medical history is reviewed in detail.  She is 65 years-old.  She is 5'2" tal and weighs 135 pounds. She has not used any medication for discomfort.  Her past history reveals that she is allergic to penicillin.  She is on no routine medications.    Prior surgery, C-section in 1983, bunions bilaterally 1988, cataract removal right eye in 2010.    Social history reveals that she is married, she is a nonsmoker, she enjoys a rare alcoholic beverage.    Family history detailed and noncontributory.    14-point review of systems reveals history of cataract removal from the right eye in 2010 with lens implant.   Physical examination reveals a well appearing 65 year-old woman. Inspection of her thumb reveals a 13x10 mm.  mass on the ulnar aspect of her right thumb. This is firm. There is some callus formation in the skin overlying the bump. She has full range of motion of her IP and MP joints.  Her neurovascular examination is intact.   Four views of her thumb demonstrate a bony exostosis deep to the bump.   ASSESSMENT:  Probable exostosis right thumb distal phalanx with secondary soft tissue hypertrophy.  I have advised her to proceed with excisional biopsy under regional anesthesia at a mutually convenient time.  The surgery and aftercare were described in detail.  We will keep you informed of her progress.   As always thanks for the privilege of seeing your patients.   Best  regards,    Katy Fitch. Sypher, M.D., Montez Hageman.  RVS:cmf T:  11.30.12

## 2011-06-08 NOTE — Anesthesia Postprocedure Evaluation (Signed)
  Anesthesia Post-op Note  Patient: Bailey Stephens  Procedure(s) Performed:  EXCISION MASS - excisional biopsy of exostosis right thumb  Patient Location: PACU  Anesthesia Type: General  Level of Consciousness: awake  Airway and Oxygen Therapy: Patient Spontanous Breathing  Post-op Pain: mild  Post-op Assessment: Post-op Vital signs reviewed  Post-op Vital Signs: stable  Complications: No apparent anesthesia complications

## 2011-06-08 NOTE — Anesthesia Preprocedure Evaluation (Addendum)
Anesthesia Evaluation  Patient identified by MRN, date of birth, ID band  Reviewed: Allergy & Precautions  Airway Mallampati: II      Dental  (+) Dental Advisory Given   Pulmonary neg pulmonary ROS,          Cardiovascular neg cardio ROS Regular Normal    Neuro/Psych Negative Neurological ROS     GI/Hepatic Neg liver ROS, GERD-  ,  Endo/Other  Negative Endocrine ROS  Renal/GU negative Renal ROS     Musculoskeletal negative musculoskeletal ROS (+)   Abdominal   Peds  Hematology negative hematology ROS (+)   Anesthesia Other Findings   Reproductive/Obstetrics                           Anesthesia Physical Anesthesia Plan  ASA: II  Anesthesia Plan: MAC   Post-op Pain Management:    Induction: Intravenous  Airway Management Planned: LMA  Additional Equipment:   Intra-op Plan:   Post-operative Plan:   Informed Consent: I have reviewed the patients History and Physical, chart, labs and discussed the procedure including the risks, benefits and alternatives for the proposed anesthesia with the patient or authorized representative who has indicated his/her understanding and acceptance.   Dental advisory given  Plan Discussed with:   Anesthesia Plan Comments:        Anesthesia Quick Evaluation

## 2011-06-08 NOTE — Transfer of Care (Signed)
Immediate Anesthesia Transfer of Care Note  Patient: [name]  Procedure(s) Performed:  EXCISION MASS - excisional biopsy of exostosis right thumb  Patient Location: PACU  Anesthesia Type: MAC  Level of Consciousness: awake, alert , oriented and patient cooperative  Airway & Oxygen Therapy: Patient Spontanous Breathing  Post-op Assessment: Report given to PACU RN and Post -op Vital signs reviewed and stable  Post vital signs: Reviewed and stable  Complications: No apparent anesthesia complications

## 2011-06-08 NOTE — Anesthesia Procedure Notes (Addendum)
Date/Time: 06/08/2011 7:52 AM Performed by: Radford Pax Pre-anesthesia Checklist: Patient identified, Emergency Drugs available, Suction available, Patient being monitored and Timeout performed Patient Re-evaluated:Patient Re-evaluated prior to inductionOxygen Delivery Method: Simple face mask Placement Confirmation: positive ETCO2 and breath sounds checked- equal and bilateral

## 2011-06-08 NOTE — H&P (Signed)
Bailey Stephens is an 65 y.o. female.   Chief Complaint: Complaining of mass ulnar aspect right thumb pulp. HPI: Patient is a 65 year old right-hand-dominant female who presented to our office recently complaining of a mass on the ulnar aspect of her right thumb pulp. The mass has been present for approximately 4-5 months. She relates no history of trauma to the thumb she is an avid gardener. She presents now to our office for further evaluation and treatment.  Past Medical History  Diagnosis Date  . Fever blister   . Overweight   . Back pain     due to large breasts  . No pertinent past medical history   . GERD (gastroesophageal reflux disease)     Past Surgical History  Procedure Date  . Bunionectomy 1988  . Cataract surgery 2010  . Cesarean section   . Tonsillectomy     Family History  Problem Relation Age of Onset  . Asthma Mother   . Cancer Father     brain tumor  . Heart disease Maternal Grandfather     MI   Social History:  reports that she quit smoking about 38 years ago. She has never used smokeless tobacco. She reports that she drinks alcohol. She reports that she does not use illicit drugs.  Allergies:  Allergies  Allergen Reactions  . Penicillins     REACTION: Pt said not sure if she was allergic  to penicillin. Pt said 40 + years ago was told allergic to penicillin. Not sure what type reaction.    Medications Prior to Admission  Medication Dose Route Frequency Provider Last Rate Last Dose  . chlorhexidine (HIBICLENS) 4 % liquid 4 application  60 mL Topical Once       . lactated ringers infusion   Intravenous Continuous Zenon Mayo, MD 20 mL/hr at 06/08/11 0719    . vancomycin (VANCOCIN) IVPB 1000 mg/200 mL premix  1,000 mg Intravenous Once        Medications Prior to Admission  Medication Sig Dispense Refill  . Cholecalciferol (VITAMIN D) 1000 UNITS capsule Take 1,000 Units by mouth daily.        Marland Kitchen estradiol (ESTRACE) 0.1 MG/GM vaginal cream  Use 1-2 cm in applicator intravaginally twice a week  42.5 g  3  . Multiple Vitamin (MULTIVITAMIN) tablet Take 1 tablet by mouth daily.        Marland Kitchen omeprazole (PRILOSEC) 40 MG capsule Take 40 mg by mouth daily as needed.          No results found for this or any previous visit (from the past 48 hour(s)).  No results found.   Pertinent items are noted in HPI.  Blood pressure 141/80, pulse 63, temperature 97.8 F (36.6 C), temperature source Oral, resp. rate 16, height 5\' 2"  (1.575 m), weight 58.06 kg (128 lb), SpO2 97.00%.  General appearance: alert Head: Normocephalic, without obvious abnormality Neck: supple, symmetrical, trachea midline Resp: clear to auscultation bilaterally Cardio: regular rate and rhythm, S1, S2 normal, no murmur, click, rub or gallop GI: normal findings: bowel sounds normal Extremities: Inspection of her right thumb reveals a firm mass on the ulnar aspect of the right thumb pulp. This does not appear to be infected vascular she is intact x-ray examination reveals a bony exostosis below the mass. Pulses: 2+ and symmetric Skin: normal Neurologic: Grossly normal    Assessment/Plan Impression: Bony exostosis with soft tissue hypertrophy right thumb ulnar aspect of her pulp  Plan: Patient be taken  to the operating room to undergo excision of the bony exostosis and debridement of the soft tissue hypertrophy the procedure and postoperative course were discussed with patient at length and she was in agreement with this plan.  DASNOIT,Lynnleigh Soden J 06/08/2011, 7:20 AM    H&P documentation: 06/08/2011  -History and Physical Reviewed  -Patient has been re-examined  -No change in the plan of care  Wyn Forster, MD

## 2011-06-08 NOTE — Progress Notes (Signed)
Pt's dressing fell off by pt. Getting dressed.  Dressing reapplied with sterile technique with sterile 4x4, sterile gloves  and coban outer dressing. Pt tolerated well. NAD

## 2011-06-12 ENCOUNTER — Encounter (HOSPITAL_BASED_OUTPATIENT_CLINIC_OR_DEPARTMENT_OTHER): Payer: Self-pay | Admitting: Orthopedic Surgery

## 2011-06-27 ENCOUNTER — Encounter: Payer: Self-pay | Admitting: Family Medicine

## 2011-06-27 ENCOUNTER — Ambulatory Visit (INDEPENDENT_AMBULATORY_CARE_PROVIDER_SITE_OTHER): Payer: Medicare Other | Admitting: Family Medicine

## 2011-06-27 VITALS — BP 148/88 | HR 60 | Temp 97.7°F | Ht 62.0 in | Wt 128.0 lb

## 2011-06-27 DIAGNOSIS — B354 Tinea corporis: Secondary | ICD-10-CM

## 2011-06-27 MED ORDER — KETOCONAZOLE 2 % EX CREA: TOPICAL_CREAM | Freq: Two times a day (BID) | CUTANEOUS | Status: DC

## 2011-06-27 NOTE — Assessment & Plan Note (Signed)
Area in R groin looks like ringworm- with discrete edge of redness and scale / central clearing inst in care- see AVS Ketoconazole cream and update if not imp

## 2011-06-27 NOTE — Progress Notes (Signed)
Subjective:    Patient ID: Bailey Stephens, female    DOB: Oct 02, 1945, 66 y.o.   MRN: [id number]  HPI Has area of irritation/ infection in groin area on R  Started as itchy area - maybe 1 mo ago  Then she had thumb operated on to remove tumor -that went well  Then last week she noted raw dark spot  Not draining  Is red- getting bigger  Not painful -itches on and off  No fever   Patient Active Problem List  Diagnoses  . Routine general medical examination at a health care facility  . Screening for lipoid disorders  . Diabetes mellitus screening  . GERD (gastroesophageal reflux disease)  . Lump on finger  . Vaginal irritation  . Tinea corporis   Past Medical History  Diagnosis Date  . Fever blister   . Overweight   . Back pain     due to large breasts  . No pertinent past medical history   . GERD (gastroesophageal reflux disease)    Past Surgical History  Procedure Date  . Bunionectomy 1988  . Cataract surgery 2010  . Cesarean section   . Tonsillectomy   . Mass excision 06/08/2011    Procedure: EXCISION MASS;  Surgeon: Wyn Forster., MD;  Location: [facility];  Service: Orthopedics;  Laterality: Right;  excisional biopsy of exostosis right thumb   History  Substance Use Topics  . Smoking status: Former Smoker    Quit date: 06/06/1973  . Smokeless tobacco: Never Used   Comment: quit 1976  . Alcohol Use: Yes     occasional wine/alcohol   Family History  Problem Relation Age of Onset  . Asthma Mother   . Cancer Father     brain tumor  . Heart disease Maternal Grandfather     MI   Allergies  Allergen Reactions  . Penicillins     REACTION: Pt said not sure if she was allergic  to penicillin. Pt said 40 + years ago was told allergic to penicillin. Not sure what type reaction.   Current Outpatient Prescriptions on File Prior to Visit  Medication Sig Dispense Refill  . Cholecalciferol (VITAMIN D) 1000 UNITS capsule Take 1,000 Units by mouth  daily.        Marland Kitchen estradiol (ESTRACE) 0.1 MG/GM vaginal cream Use 1-2 cm in applicator intravaginally twice a week  42.5 g  3  . Multiple Vitamin (MULTIVITAMIN) tablet Take 1 tablet by mouth daily.        Marland Kitchen omeprazole (PRILOSEC) 40 MG capsule Take 40 mg by mouth daily as needed.             Review of Systems Review of Systems  Constitutional: Negative for fever, appetite change, fatigue and unexpected weight change.  Eyes: Negative for pain and visual disturbance.  Respiratory: Negative for cough and shortness of breath.   Cardiovascular: Negative for cp or palpitations    Gastrointestinal: Negative for nausea, diarrhea and constipation.  Genitourinary: Negative for urgency and frequency.  Skin: Negative for pallor , pos for itchy rash  Neurological: Negative for weakness, light-headedness, numbness and headaches.  Hematological: Negative for adenopathy. Does not bruise/bleed easily.  Psychiatric/Behavioral: Negative for dysphoric mood. The patient is not nervous/anxious.          Objective:   Physical Exam  Constitutional: She appears well-developed and well-nourished. No distress.  Eyes: Conjunctivae and EOM are normal. Pupils are equal, round, and reactive to light.  Neck: Normal  range of motion. Neck supple.  Cardiovascular: Normal rate, regular rhythm and normal heart sounds.   Pulmonary/Chest: Effort normal and breath sounds normal. No respiratory distress. She has no wheezes.  Abdominal: Soft. Bowel sounds are normal. She exhibits no distension. There is no tenderness.  Lymphadenopathy:    She has no cervical adenopathy.  Neurological: She is alert.  Skin: Skin is warm and dry. Rash noted. There is erythema.       R groin area 2-3 cm of erythema with sharp edge and scale with some central clearing  Scale is diffuse  Resembles tinea/ ringworm Few areas of excoriation No evidence of abcess or bactereal infection    Psychiatric: She has a normal mood and affect.           Assessment & Plan:

## 2011-06-27 NOTE — Patient Instructions (Signed)
I think you have a fungal infection of the skin Keep the area clean and dry with antibacterial soap and water  Use cream ketoconazole twice daily  If not starting to improve in 10 days call - or if fever or any other symptoms

## 2011-10-12 ENCOUNTER — Other Ambulatory Visit: Payer: Self-pay | Admitting: Dermatology

## 2011-11-05 ENCOUNTER — Ambulatory Visit (INDEPENDENT_AMBULATORY_CARE_PROVIDER_SITE_OTHER): Payer: Medicare Other | Admitting: Family Medicine

## 2011-11-05 ENCOUNTER — Encounter: Payer: Self-pay | Admitting: Family Medicine

## 2011-11-05 ENCOUNTER — Telehealth: Payer: Self-pay

## 2011-11-05 VITALS — BP 122/80 | HR 76 | Temp 97.7°F | Ht 62.0 in | Wt 144.5 lb

## 2011-11-05 DIAGNOSIS — N390 Urinary tract infection, site not specified: Secondary | ICD-10-CM

## 2011-11-05 DIAGNOSIS — M545 Low back pain, unspecified: Secondary | ICD-10-CM

## 2011-11-05 DIAGNOSIS — R109 Unspecified abdominal pain: Secondary | ICD-10-CM

## 2011-11-05 LAB — POCT URINALYSIS DIPSTICK
Nitrite, UA: NEGATIVE
Urobilinogen, UA: 0.2
pH, UA: 5

## 2011-11-05 LAB — POCT UA - MICROSCOPIC ONLY

## 2011-11-05 MED ORDER — CIPROFLOXACIN HCL 250 MG PO TABS: 250.0000 mg | ORAL_TABLET | Freq: Two times a day (BID) | ORAL | Status: DC

## 2011-11-05 NOTE — Assessment & Plan Note (Signed)
Pos ua with some R low back pain  tx with 3 d of cipro  cx urine  Adv water - update if worse

## 2011-11-05 NOTE — Assessment & Plan Note (Signed)
This seems to be worse with movement- exam consistent with low back strain However - ua is pos  Will tx for uti and cx Also disc use of heat and stretching Further plan when cx returns

## 2011-11-05 NOTE — Patient Instructions (Signed)
Drink lots of water  Take the cipro as directed We will call you when urine culture returns If worse let me know Use heat on area that hurts

## 2011-11-05 NOTE — Progress Notes (Signed)
Subjective:    Patient ID: Bailey Stephens, female    DOB: [birth year], 66 y.o.   MRN: [id number]  HPI Here for several symptoms   Started with dizziness and headache and that got better Then woke up 4 am - pain in R side - pelvic and low back -- is 5/10 on pain scale  Is achey / throbbing / but not sharp  No rash at all   Worse with getting up and down Does not rad to groin  Does rad down lateral leg -- just thigh  No pain  No ice or heat   No urinary changes or blood  Nl BMs also   Also nauseated - has not vomited  Some chlls - has not taken temp   Patient Active Problem List  Diagnoses  . Routine general medical examination at a health care facility  . Screening for lipoid disorders  . Diabetes mellitus screening  . GERD (gastroesophageal reflux disease)  . Lump on finger  . Vaginal irritation  . Tinea corporis  . UTI (lower urinary tract infection)  . Right low back pain   Past Medical History  Diagnosis Date  . Fever blister   . Overweight   . Back pain     due to large breasts  . No pertinent past medical history   . GERD (gastroesophageal reflux disease)    Past Surgical History  Procedure Date  . Bunionectomy 1988  . Cataract surgery 2010  . Cesarean section   . Tonsillectomy   . Mass excision 06/08/2011    Procedure: EXCISION MASS;  Surgeon: Wyn Forster., MD;  Location: [facility];  Service: Orthopedics;  Laterality: Right;  excisional biopsy of exostosis right thumb   History  Substance Use Topics  . Smoking status: Former Smoker    Quit date: 06/06/1973  . Smokeless tobacco: Never Used   Comment: quit 1976  . Alcohol Use: Yes     occasional wine/alcohol   Family History  Problem Relation Age of Onset  . Asthma Mother   . Cancer Father     brain tumor  . Heart disease Maternal Grandfather     MI   Allergies  Allergen Reactions  . Penicillins     REACTION: Pt said not sure if she was allergic  to penicillin. Pt  said 40 + years ago was told allergic to penicillin. Not sure what type reaction.   Current Outpatient Prescriptions on File Prior to Visit  Medication Sig Dispense Refill  . Cholecalciferol (VITAMIN D) 1000 UNITS capsule Take 1,000 Units by mouth daily.        Marland Kitchen estradiol (ESTRACE) 0.1 MG/GM vaginal cream Use 1-2 cm in applicator intravaginally twice a week  42.5 g  3  . Multiple Vitamin (MULTIVITAMIN) tablet Take 1 tablet by mouth daily.        Marland Kitchen omeprazole (PRILOSEC) 40 MG capsule Take 40 mg by mouth daily as needed.            Review of Systems Review of Systems  Constitutional: Negative for fever, appetite change, fatigue and unexpected weight change.  Eyes: Negative for pain and visual disturbance.  Respiratory: Negative for cough and shortness of breath.   Cardiovascular: Negative for cp or palpitations    Gastrointestinal: Negative for nausea, diarrhea and constipation.  Genitourinary: Negative for urgency and pos for frequency, neg for odor or blood in urine Skin: Negative for pallor or rash   MSK pos for side/  back pain  Neurological: Negative for weakness, light-headedness, numbness and headaches.  Hematological: Negative for adenopathy. Does not bruise/bleed easily.  Psychiatric/Behavioral: Negative for dysphoric mood. The patient is not nervous/anxious.         Objective:   Physical Exam  Constitutional: She appears well-developed and well-nourished. No distress.  HENT:  Head: Normocephalic and atraumatic.  Mouth/Throat: Oropharynx is clear and moist.  Eyes: Conjunctivae and EOM are normal. Pupils are equal, round, and reactive to light.  Neck: Neck supple.  Cardiovascular: Normal rate and regular rhythm.   Pulmonary/Chest: Breath sounds normal. No respiratory distress. She has no wheezes.  Abdominal: Soft. Bowel sounds are normal. She exhibits no distension and no mass. There is no tenderness. There is no rebound and no guarding.       No suprapubic tenderness      Musculoskeletal: She exhibits tenderness. She exhibits no edema.       R flank/ mid to low back tenderness in musculature and ribs, no spinous process tenderness Limited flexion Neg slr  Mild cva tenderness  Lymphadenopathy:    She has no cervical adenopathy.  Neurological: She is alert.  Skin: Skin is warm and dry. No rash noted. No erythema. No pallor.  Psychiatric: She has a normal mood and affect.          Assessment & Plan:

## 2011-11-05 NOTE — Telephone Encounter (Signed)
Pt walked in with dull rt side pain that moves across abdomen when pt stands or sits with nausea since 4 am this morning.Pt had normal BM this AM, no fever, no UTI symptoms, no back pain. No known injury or straining. Pain level now is 5. Pt said she wants to wait to see Dr Milinda Antis at [facility]. Lowella Bandy notified pts condition and that she is in lobby. Lowella Bandy said to add on.

## 2011-11-05 NOTE — Telephone Encounter (Signed)
Pt was seen in office.

## 2011-11-07 LAB — URINE CULTURE

## 2011-11-08 ENCOUNTER — Telehealth: Payer: Self-pay

## 2011-11-08 MED ORDER — CIPROFLOXACIN HCL 250 MG PO TABS: 250.0000 mg | ORAL_TABLET | Freq: Two times a day (BID) | ORAL | Status: DC

## 2011-11-08 NOTE — Telephone Encounter (Signed)
Pt seen 11/05/11. Still right sided nagging dull pain, not as bad as Monday; pain level now 4. Pt finished Cipro on 11/07/11. No fever, no N&V, no diarrhea,no back pain, no burning or frequency with urination.Pt uses [facility] [address] and pt can be reached [phone]. Advised pt of urine culture results also.Please advise.

## 2011-11-08 NOTE — Telephone Encounter (Signed)
Ok - I think her back pain is probably more back related - I would have her f/u with her PCP if that flares again , or if urinary symptoms

## 2011-11-08 NOTE — Telephone Encounter (Signed)
Patient notified as instructed by telephone. Medication phoned to [facility] [address] [facility] as instructed.

## 2011-11-08 NOTE — Telephone Encounter (Signed)
We can continue the cipro for 3 more days and see how she does - again , unsure if this is the cause since her ucx was inconclusive F/u next week if not imp , call earlier if worse Px written for call in

## 2011-11-08 NOTE — Telephone Encounter (Signed)
Patient says it is not her back that's hurting.  It is actually her side and it got better when she was taking the medication but it is not completely gone.  Would you be willing to refill the medication before she schedules another appt?

## 2011-11-08 NOTE — Telephone Encounter (Signed)
Pt called back if cannot get med called in today pt would like appt with Dr Milinda Antis 11/09/11. Pt can be reached on cell [phone].

## 2011-11-13 ENCOUNTER — Ambulatory Visit (INDEPENDENT_AMBULATORY_CARE_PROVIDER_SITE_OTHER): Payer: Medicare Other | Admitting: Family Medicine

## 2011-11-13 ENCOUNTER — Encounter: Payer: Self-pay | Admitting: Family Medicine

## 2011-11-13 VITALS — BP 120/82 | HR 74 | Temp 98.1°F | Ht 62.0 in | Wt 143.0 lb

## 2011-11-13 DIAGNOSIS — N760 Acute vaginitis: Secondary | ICD-10-CM

## 2011-11-13 DIAGNOSIS — R109 Unspecified abdominal pain: Secondary | ICD-10-CM

## 2011-11-13 DIAGNOSIS — A499 Bacterial infection, unspecified: Secondary | ICD-10-CM

## 2011-11-13 DIAGNOSIS — B9689 Other specified bacterial agents as the cause of diseases classified elsewhere: Secondary | ICD-10-CM | POA: Insufficient documentation

## 2011-11-13 DIAGNOSIS — N949 Unspecified condition associated with female genital organs and menstrual cycle: Secondary | ICD-10-CM

## 2011-11-13 DIAGNOSIS — R102 Pelvic and perineal pain: Secondary | ICD-10-CM

## 2011-11-13 LAB — POCT URINALYSIS DIPSTICK
Blood, UA: NEGATIVE
Ketones, UA: NEGATIVE
Spec Grav, UA: 1.01

## 2011-11-13 LAB — POCT UA - MICROSCOPIC ONLY
Mucus, UA: 0
WBC, Ur, HPF, POC: 0
Yeast, UA: 0

## 2011-11-13 LAB — POCT WET PREP (WET MOUNT): KOH Wet Prep POC: NEGATIVE

## 2011-11-13 MED ORDER — METRONIDAZOLE 500 MG PO TABS: 500.0000 mg | ORAL_TABLET | Freq: Two times a day (BID) | ORAL | Status: AC

## 2011-11-13 NOTE — Assessment & Plan Note (Signed)
Worse on R  Mild tenderness  Neg ua Clue cells on wet prep Will tx bact vaginosis- if not imp or if worse consider pelvic US

## 2011-11-13 NOTE — Assessment & Plan Note (Signed)
On wet prep with pelvic pain tx with flagyl orally for a week  Update - if not imp will do pelvic US

## 2011-11-13 NOTE — Patient Instructions (Signed)
I think you may have vaginitis- this can sometimes cause pelvic pain Take the metronidazole as directed  If not improving - call and I will schedule a pelvic ultrasound If worse - alert me earlier

## 2011-11-13 NOTE — Progress Notes (Signed)
Subjective:    Patient ID: Bailey Stephens, female    DOB: 1945-10-31, 66 y.o.   MRN: [id number]  HPI Here for f/u of R flank/ back pain   Last visit tx with cipro- total of 6 d  cx came back contaminated and inconclusive   ua today is clear  On Monday night - had the worst pain she has ever had -- with pressure in pelvic area when she sat on the toilet  Improved quite a bit on the cipro By wed felt fine  Got a little better and then worse  Finished abx - and today pain is back   Right now pain is in R groin and rad to leg a little  Not tender No dysuria/ no blood / no fever or n/v   No hx of OA of hip  No change with movement at all    Had colonosc nl within past 5 years - was ok   Has all her parts gyn  Does not see gyn  ? Last gyn exam-- pap was 2010 Uses vaginal estrogen cream No d/c or bleeding  Patient Active Problem List  Diagnoses  . Routine general medical examination at a health care facility  . Screening for lipoid disorders  . Diabetes mellitus screening  . GERD (gastroesophageal reflux disease)  . Lump on finger  . Vaginal irritation  . Tinea corporis  . UTI (lower urinary tract infection)  . Right low back pain  . Pelvic joint pain, right   Past Medical History  Diagnosis Date  . Fever blister   . Overweight   . Back pain     due to large breasts  . No pertinent past medical history   . GERD (gastroesophageal reflux disease)    Past Surgical History  Procedure Date  . Bunionectomy 1988  . Cataract surgery 2010  . Cesarean section   . Tonsillectomy   . Mass excision 06/08/2011    Procedure: EXCISION MASS;  Surgeon: Wyn Forster., MD;  Location: [facility];  Service: Orthopedics;  Laterality: Right;  excisional biopsy of exostosis right thumb   History  Substance Use Topics  . Smoking status: Former Smoker    Quit date: 06/06/1973  . Smokeless tobacco: Never Used   Comment: quit 1976  . Alcohol Use: Yes   occasional wine/alcohol   Family History  Problem Relation Age of Onset  . Asthma Mother   . Cancer Father     brain tumor  . Heart disease Maternal Grandfather     MI   Allergies  Allergen Reactions  . Penicillins     REACTION: Pt said not sure if she was allergic  to penicillin. Pt said 40 + years ago was told allergic to penicillin. Not sure what type reaction.   Current Outpatient Prescriptions on File Prior to Visit  Medication Sig Dispense Refill  . Cholecalciferol (VITAMIN D) 1000 UNITS capsule Take 1,000 Units by mouth daily.        Marland Kitchen estradiol (ESTRACE) 0.1 MG/GM vaginal cream Use 1-2 cm in applicator intravaginally twice a week  42.5 g  3  . Multiple Vitamin (MULTIVITAMIN) tablet Take 1 tablet by mouth daily.        Marland Kitchen omeprazole (PRILOSEC) 40 MG capsule Take 40 mg by mouth daily as needed.             Review of Systems Review of Systems  Constitutional: Negative for fever, appetite change, fatigue and unexpected  weight change.  Eyes: Negative for pain and visual disturbance.  Respiratory: Negative for cough and shortness of breath.   Cardiovascular: Negative for cp or palpitations    Gastrointestinal: Negative for nausea, diarrhea and constipation. Pos for R low abd/pelvic pain Genitourinary: Negative for urgency and frequency. no dysuria or hematuria  Skin: Negative for pallor or rash   Neurological: Negative for weakness, light-headedness, numbness and headaches.  Hematological: Negative for adenopathy. Does not bruise/bleed easily.  Psychiatric/Behavioral: Negative for dysphoric mood. The patient is not nervous/anxious.         Objective:   Physical Exam  Constitutional: She appears well-developed and well-nourished. No distress.  HENT:  Head: Normocephalic and atraumatic.  Mouth/Throat: Oropharynx is clear and moist.  Eyes: Conjunctivae and EOM are normal. Pupils are equal, round, and reactive to light. No scleral icterus.  Neck: Normal range of motion.  Neck supple. No JVD present. No thyromegaly present.  Cardiovascular: Normal rate and regular rhythm.   Pulmonary/Chest: Effort normal and breath sounds normal. No respiratory distress. She has no wheezes. She exhibits no tenderness.  Abdominal: Soft. Bowel sounds are normal. She exhibits no distension and no mass. There is tenderness. There is no rebound and no guarding.  Genitourinary: There is no rash, tenderness or lesion on the right labia. There is no rash, tenderness or lesion on the left labia. Uterus is not enlarged. Cervix exhibits no motion tenderness. Right adnexum displays tenderness. Right adnexum displays no mass and no fullness. Left adnexum displays no mass, no tenderness and no fullness. No tenderness or bleeding around the vagina. No vaginal discharge found.       Mildly tender in R pelvic /adnexal area    Musculoskeletal: She exhibits no edema and no tenderness.       No LS or CVA tenderness  Lymphadenopathy:    She has no cervical adenopathy.  Neurological: She is alert. She has normal reflexes. No cranial nerve deficit.  Skin: Skin is warm and dry. No rash noted. No erythema. No pallor.  Psychiatric: She has a normal mood and affect.          Assessment & Plan:

## 2011-11-16 ENCOUNTER — Encounter: Payer: Self-pay | Admitting: Family Medicine

## 2011-11-16 ENCOUNTER — Telehealth: Payer: Self-pay | Admitting: Family Medicine

## 2011-11-16 ENCOUNTER — Telehealth: Payer: Self-pay

## 2011-11-16 ENCOUNTER — Ambulatory Visit: Payer: Self-pay | Admitting: Family Medicine

## 2011-11-16 DIAGNOSIS — R102 Pelvic and perineal pain: Secondary | ICD-10-CM

## 2011-11-16 NOTE — Telephone Encounter (Signed)
I got call report from [place] of pelvis -- it showed normal ovaries/ some uterine fibroids (do not think these are causing pain ) , but did see small amt of fluid above R ovary-- do not know where it is from or if related to pain  I would like to get CT of abdomen and pelvis when able to give me more info -- need labs to do this since will need contrast , cbc with diff and bmp  Please let me know if agreeable to lab and CT and I will get going on orders

## 2011-11-16 NOTE — Telephone Encounter (Signed)
Patient advised as instructed via telephone, she would like to get CT scan.  Lab appt scheduled for 11/19/2011.  She will wait to hear from [facility] to set up CT scan.

## 2011-11-16 NOTE — Telephone Encounter (Signed)
Patient advised as instructed via telephone, advised her to wait to hear from [facility] who will be calling her to set up referral today.

## 2011-11-16 NOTE — Telephone Encounter (Signed)
Left message on machine at home and on cell phone voicemail for patient to return call. 

## 2011-11-16 NOTE — Telephone Encounter (Signed)
Lets do pelvic ultrasound Will refer for that now

## 2011-11-16 NOTE — Telephone Encounter (Signed)
Pt had no pain yesterday. Today at 5 am rt groin pain radiating down rt leg began again. Now pain not as bad; pain level now is 4 but pt feels nauseated also. Pt said no pelvic or abdominal pain, no back pain, no fever and no vomiting and no frequency or burning upon urination. Pt said Dr Milinda Antis had mentioned doing an [place] if pain continued. [facility] [address] if pharmacy needed and pt can be reached at [phone].

## 2011-11-18 ENCOUNTER — Telehealth: Payer: Self-pay | Admitting: Family Medicine

## 2011-11-18 DIAGNOSIS — R102 Pelvic and perineal pain: Secondary | ICD-10-CM

## 2011-11-18 NOTE — Telephone Encounter (Signed)
Test ordered  Labs ordered

## 2011-11-18 NOTE — Telephone Encounter (Signed)
CT and labs were ordered

## 2011-11-19 ENCOUNTER — Other Ambulatory Visit (INDEPENDENT_AMBULATORY_CARE_PROVIDER_SITE_OTHER): Payer: Medicare Other

## 2011-11-19 ENCOUNTER — Other Ambulatory Visit: Payer: Medicare Other

## 2011-11-19 DIAGNOSIS — K219 Gastro-esophageal reflux disease without esophagitis: Secondary | ICD-10-CM

## 2011-11-19 DIAGNOSIS — Z131 Encounter for screening for diabetes mellitus: Secondary | ICD-10-CM

## 2011-11-19 LAB — CBC WITH DIFFERENTIAL/PLATELET
Basophils Absolute: 0 10*3/uL (ref 0.0–0.1)
Eosinophils Absolute: 0.2 10*3/uL (ref 0.0–0.7)
Lymphocytes Relative: 23.8 % (ref 12.0–46.0)
MCHC: 33.2 g/dL (ref 30.0–36.0)
Neutrophils Relative %: 66.6 % (ref 43.0–77.0)
RDW: 13 % (ref 11.5–14.6)

## 2011-11-19 LAB — COMPREHENSIVE METABOLIC PANEL
ALT: 18 U/L (ref 0–35)
AST: 21 U/L (ref 0–37)
Albumin: 4 g/dL (ref 3.5–5.2)
Calcium: 9.2 mg/dL (ref 8.4–10.5)
Chloride: 107 mEq/L (ref 96–112)
Potassium: 3.9 mEq/L (ref 3.5–5.1)

## 2011-11-19 NOTE — Telephone Encounter (Signed)
Patient was advised on 11/16/2011 that CT and labs have been ordered.  She will come in on 11/19/2011 to have labs done.

## 2011-11-21 ENCOUNTER — Ambulatory Visit (INDEPENDENT_AMBULATORY_CARE_PROVIDER_SITE_OTHER)
Admission: RE | Admit: 2011-11-21 | Discharge: 2011-11-21 | Disposition: A | Discharge status: Home / Self Care | Payer: Medicare Other | Source: Ambulatory Visit | Attending: Cardiovascular Disease | Admitting: Cardiovascular Disease

## 2011-11-21 ENCOUNTER — Telehealth: Payer: Self-pay

## 2011-11-21 DIAGNOSIS — N949 Unspecified condition associated with female genital organs and menstrual cycle: Secondary | ICD-10-CM

## 2011-11-21 DIAGNOSIS — R102 Pelvic and perineal pain: Secondary | ICD-10-CM

## 2011-11-21 MED ORDER — IOHEXOL 300 MG/ML  SOLN
100.0000 mL | Freq: Once | INTRAMUSCULAR | Status: AC | PRN
  Administered 2011-11-21: 100 mL via INTRAVENOUS

## 2011-11-21 NOTE — Telephone Encounter (Signed)
Please call pt with results of CT scan, this afternoon, if possible.

## 2011-11-21 NOTE — Telephone Encounter (Signed)
Pt left vm requesting CT scan result. 

## 2011-11-21 NOTE — Telephone Encounter (Signed)
I already commented on that this am -- you can see the scan  Nothing new on CT- very re assuring  How are symptoms?

## 2011-11-22 NOTE — Telephone Encounter (Signed)
Patient advised as instructed via telephone, see result note for CT scan.

## 2011-12-31 ENCOUNTER — Other Ambulatory Visit: Payer: Self-pay | Admitting: Obstetrics & Gynecology

## 2012-01-31 ENCOUNTER — Other Ambulatory Visit: Payer: Self-pay | Admitting: Family Medicine

## 2012-01-31 DIAGNOSIS — Z1231 Encounter for screening mammogram for malignant neoplasm of breast: Secondary | ICD-10-CM

## 2012-02-15 ENCOUNTER — Ambulatory Visit
Admission: RE | Admit: 2012-02-15 | Discharge: 2012-02-15 | Disposition: A | Discharge status: Home / Self Care | Payer: Medicare Other | Source: Ambulatory Visit | Attending: Family Medicine | Admitting: Family Medicine

## 2012-02-15 DIAGNOSIS — Z1231 Encounter for screening mammogram for malignant neoplasm of breast: Secondary | ICD-10-CM

## 2012-05-28 ENCOUNTER — Ambulatory Visit (INDEPENDENT_AMBULATORY_CARE_PROVIDER_SITE_OTHER): Payer: Medicare Other | Admitting: *Deleted

## 2012-05-28 DIAGNOSIS — Z23 Encounter for immunization: Secondary | ICD-10-CM

## 2012-05-28 DIAGNOSIS — Z2911 Encounter for prophylactic immunotherapy for respiratory syncytial virus (RSV): Secondary | ICD-10-CM

## 2012-07-02 HISTORY — PX: REDUCTION MAMMAPLASTY: SUR839

## 2012-11-04 ENCOUNTER — Other Ambulatory Visit: Payer: Self-pay | Admitting: Dermatology

## 2012-11-11 ENCOUNTER — Ambulatory Visit (INDEPENDENT_AMBULATORY_CARE_PROVIDER_SITE_OTHER): Payer: Medicare Other | Admitting: Internal Medicine

## 2012-11-11 ENCOUNTER — Encounter: Payer: Self-pay | Admitting: Internal Medicine

## 2012-11-11 ENCOUNTER — Telehealth: Payer: Self-pay | Admitting: Family Medicine

## 2012-11-11 VITALS — BP 130/90 | HR 61 | Temp 97.7°F | Wt 142.0 lb

## 2012-11-11 DIAGNOSIS — H9209 Otalgia, unspecified ear: Secondary | ICD-10-CM

## 2012-11-11 DIAGNOSIS — H9202 Otalgia, left ear: Secondary | ICD-10-CM

## 2012-11-11 MED ORDER — NEOMYCIN-POLYMYXIN-HC 3.5-10000-1 OT SUSP: 3.0000 [drp] | Freq: Three times a day (TID) | OTIC | Status: DC

## 2012-11-11 MED ORDER — TRIAMCINOLONE ACETONIDE 0.1 % EX CREA: TOPICAL_CREAM | Freq: Two times a day (BID) | CUTANEOUS | Status: DC | PRN

## 2012-11-11 NOTE — Progress Notes (Signed)
  Subjective:    Patient ID: Bailey Stephens, female    DOB: [birth year], 67 y.o.   MRN: [id number]  HPI Having pain in left ear since last night Pain is shooting Had in the past and tried OTC wax remover Tried this last night and it didn't help  No URI symptoms like rhinorrhea, cough, congestion No swimming No known exposures  Current Outpatient Prescriptions on File Prior to Visit  Medication Sig Dispense Refill  . Cholecalciferol (VITAMIN D) 1000 UNITS capsule Take 1,000 Units by mouth daily.        Marland Kitchen estradiol (ESTRACE) 0.1 MG/GM vaginal cream Use 1-2 cm in applicator intravaginally twice a week  42.5 g  3  . Multiple Vitamin (MULTIVITAMIN) tablet Take 1 tablet by mouth daily.         No current facility-administered medications on file prior to visit.    Allergies  Allergen Reactions  . Penicillins     REACTION: Pt said not sure if she was allergic  to penicillin. Pt said 40 + years ago was told allergic to penicillin. Not sure what type reaction.    Past Medical History  Diagnosis Date  . Fever blister   . Overweight   . Back pain     due to large breasts  . No pertinent past medical history   . GERD (gastroesophageal reflux disease)     Past Surgical History  Procedure Laterality Date  . Bunionectomy  1988  . Cataract surgery  2010  . Cesarean section    . Tonsillectomy    . Mass excision  06/08/2011    Procedure: EXCISION MASS;  Surgeon: Wyn Forster., MD;  Location: [facility];  Service: Orthopedics;  Laterality: Right;  excisional biopsy of exostosis right thumb    Family History  Problem Relation Age of Onset  . Asthma Mother   . Cancer Father     brain tumor  . Heart disease Maternal Grandfather     MI    History   Social History  . Marital Status: Married    Spouse Name: N/A    Number of Children: N/A  . Years of Education: N/A   Occupational History  . Not on file.   Social History Main Topics  . Smoking status:  Former Smoker    Quit date: 06/06/1973  . Smokeless tobacco: Never Used     Comment: quit 1976  . Alcohol Use: Yes     Comment: occasional wine/alcohol  . Drug Use: No  . Sexually Active: Yes   Other Topics Concern  . Not on file   Social History Narrative  . No narrative on file   Review of Systems No hearing loss or tinnitus Did have a little vertigo this AM--relates to positioning to do drops No fever    Objective:   Physical Exam  Constitutional: She appears well-developed and well-nourished. No distress.  HENT:  Mouth/Throat: Oropharynx is clear and moist. No oropharyngeal exudate.  Canals and TMs normal No tragal tenderness Mild tenderness from speculum on left  Skin:  Couple of papules on left hand and papular rash around right ear (poison ivy)          Assessment & Plan:

## 2012-11-11 NOTE — Assessment & Plan Note (Signed)
Looks to be involving the external canal Will try cortisporin suspension

## 2012-11-11 NOTE — Telephone Encounter (Signed)
Patient Information:  Caller Name: Sheana  Phone: [phone]  Patient: Bailey Stephens, Heiden  Gender: Female  DOB: [birth year]  Age: 67 Years  PCP: [facility] [employer] [facility])  Office Follow Up:  Does the office need to follow up with this patient?: No  Instructions For The Office: N/A  RN Note:  Ear pain is random, but when it occurs it is sharp and is an 8/10 on pain scale. Head feels a little dizzy with onset this morning.  Noted sensation after a meal.  Hearing in affected ear (left) is slightly muffled.  Triaged with care advice given.  With onset of head swimming and ineffectiveness of patient's tried home remedy RN has scheduled appointment with Dr. Alphonsus Sias at 12:15 today 05/13.  Dr. Milinda Antis is unavailable.  Symptoms  Reason For Call & Symptoms: Ear pain with head swimming.  Reviewed Health History In EMR: Yes  Reviewed Medications In EMR: Yes  Reviewed Allergies In EMR: Yes  Reviewed Surgeries / Procedures: Yes  Date of Onset of Symptoms: 11/10/2012  Treatments Tried: Wax remover but has not helped  Treatments Tried Worked: No  Guideline(s) Used:  Ear - Congestion  Disposition Per Guideline:   See Today in Office  Reason For Disposition Reached:   Patient wants to be seen  Advice Given:  Call Back If:   Ear congestion lasts over 48 hours  Ear pain or fever occurs  You become worse.  Patient Will Follow Care Advice:  YES  Appointment Scheduled:  11/11/2012 12:15:00 Appointment Scheduled Provider:  [facility])

## 2013-01-21 ENCOUNTER — Other Ambulatory Visit: Payer: Self-pay

## 2013-01-21 DIAGNOSIS — Z1231 Encounter for screening mammogram for malignant neoplasm of breast: Secondary | ICD-10-CM

## 2013-02-04 ENCOUNTER — Other Ambulatory Visit: Payer: Self-pay

## 2013-02-17 ENCOUNTER — Ambulatory Visit
Admission: RE | Admit: 2013-02-17 | Discharge: 2013-02-17 | Disposition: A | Discharge status: Home / Self Care | Payer: Medicare Other | Source: Ambulatory Visit

## 2013-02-17 DIAGNOSIS — Z1231 Encounter for screening mammogram for malignant neoplasm of breast: Secondary | ICD-10-CM

## 2013-05-07 ENCOUNTER — Other Ambulatory Visit: Payer: Self-pay

## 2013-05-24 ENCOUNTER — Telehealth: Payer: Self-pay | Admitting: Family Medicine

## 2013-05-24 DIAGNOSIS — Z0000 Encounter for general adult medical examination without abnormal findings: Secondary | ICD-10-CM | POA: Insufficient documentation

## 2013-05-24 DIAGNOSIS — Z131 Encounter for screening for diabetes mellitus: Secondary | ICD-10-CM

## 2013-05-24 NOTE — Telephone Encounter (Signed)
Message copied by Judy Pimple on Sun May 24, 2013  4:22 PM ------      Message from: Alvina Chou      Created: Fri May 22, 2013 11:58 AM      Regarding: Lab orders for Monday, 12.1.14       Patient is scheduled for CPX labs, please order future labs, Thanks , Terri       ------

## 2013-06-01 ENCOUNTER — Other Ambulatory Visit (INDEPENDENT_AMBULATORY_CARE_PROVIDER_SITE_OTHER): Payer: Medicare Other

## 2013-06-01 DIAGNOSIS — Z0000 Encounter for general adult medical examination without abnormal findings: Secondary | ICD-10-CM

## 2013-06-01 DIAGNOSIS — Z136 Encounter for screening for cardiovascular disorders: Secondary | ICD-10-CM

## 2013-06-01 LAB — CBC WITH DIFFERENTIAL/PLATELET
Basophils Absolute: 0 10*3/uL (ref 0.0–0.1)
Eosinophils Absolute: 0.2 10*3/uL (ref 0.0–0.7)
Hemoglobin: 13.4 g/dL (ref 12.0–15.0)
Lymphocytes Relative: 32.9 % (ref 12.0–46.0)
MCHC: 34.2 g/dL (ref 30.0–36.0)
Monocytes Absolute: 0.5 10*3/uL (ref 0.1–1.0)
Neutro Abs: 3.5 10*3/uL (ref 1.4–7.7)
Neutrophils Relative %: 55.9 % (ref 43.0–77.0)
RDW: 13.2 % (ref 11.5–14.6)

## 2013-06-01 LAB — LIPID PANEL
HDL: 68.6 mg/dL (ref >39.00)
LDL Cholesterol: 84 mg/dL (ref 0–99)
Total CHOL/HDL Ratio: 3
VLDL: 22 mg/dL (ref 0.0–40.0)

## 2013-06-01 LAB — COMPREHENSIVE METABOLIC PANEL
ALT: 15 U/L (ref 0–35)
AST: 15 U/L (ref 0–37)
Alkaline Phosphatase: 50 U/L (ref 39–117)
Calcium: 9.2 mg/dL (ref 8.4–10.5)
Chloride: 107 mEq/L (ref 96–112)
Creatinine, Ser: 0.8 mg/dL (ref 0.4–1.2)

## 2013-06-01 LAB — TSH: TSH: 2.4 u[IU]/mL (ref 0.35–5.50)

## 2013-06-05 ENCOUNTER — Encounter: Payer: Self-pay | Admitting: Family Medicine

## 2013-06-05 ENCOUNTER — Ambulatory Visit (INDEPENDENT_AMBULATORY_CARE_PROVIDER_SITE_OTHER): Payer: Medicare Other | Admitting: Family Medicine

## 2013-06-05 VITALS — BP 124/80 | HR 98 | Temp 98.2°F | Ht 60.25 in | Wt 146.0 lb

## 2013-06-05 DIAGNOSIS — Z23 Encounter for immunization: Secondary | ICD-10-CM

## 2013-06-05 DIAGNOSIS — Z0000 Encounter for general adult medical examination without abnormal findings: Secondary | ICD-10-CM

## 2013-06-05 MED ORDER — ESTRADIOL 0.1 MG/GM VA CREA: TOPICAL_CREAM | VAGINAL | Status: DC

## 2013-06-05 NOTE — Progress Notes (Signed)
Subjective:    Patient ID: Bailey Stephens, female    DOB: [birth year], 67 y.o.   MRN: [id number]  HPI I have personally reviewed the Medicare Annual Wellness questionnaire and have noted 1. The patient's medical and social history 2. Their use of alcohol, tobacco or illicit drugs 3. Their current medications and supplements 4. The patient's functional ability including ADL's, fall risks, home safety risks and hearing or visual             impairment. 5. Diet and physical activities 6. Evidence for depression or mood disorders  The patients weight, height, BMI have been recorded in the chart and visual acuity is per eye clinic.  I have made referrals, counseling and provided education to the patient based review of the above and I have provided the pt with a written personalized care plan for preventive services.  Nothing new going on overall  Has an upcoming appt with surgeon for breast reduction surgery    See scanned forms.  Routine anticipatory guidance given to patient.  See health maintenance. Flu- had vaccine today  Shingles 2/11  PNA 2/11  Tetanus 2/11  Colonoscopy 1/09 - normal - 10 recall  Breast cancer screening- 8/14 normal  No breast lumps on self exam  No gyn problems at all  Advance directive- has a living will  Cognitive function addressed- see scanned forms- and if abnormal then additional documentation follows. -no worries about that at all  She is taking lessons to play bridge - she enjoys that and it keeps her mentally sharp   PMH and SH reviewed  Meds, vitals, and allergies reviewed.   ROS: See HPI.  Otherwise negative.    Nl dexa 1/06  Wt is up 4 lb with bmi of 28 She eats a healthy diet for the most part  She walks regularly   On estrace cream- uses if she is sexually active and it works well  Needs a refill of this      Chemistry      Component Value Date/Time   NA 141 06/01/2013 0815   K 4.4 06/01/2013 0815   CL 107 06/01/2013 0815   CO2  28 06/01/2013 0815   BUN 12 06/01/2013 0815   CREATININE 0.8 06/01/2013 0815      Component Value Date/Time   CALCIUM 9.2 06/01/2013 0815   ALKPHOS 50 06/01/2013 0815   AST 15 06/01/2013 0815   ALT 15 06/01/2013 0815   BILITOT 0.4 06/01/2013 0815      Lab Results  Component Value Date   WBC 6.2 06/01/2013   HGB 13.4 06/01/2013   HCT 39.0 06/01/2013   MCV 90.3 06/01/2013   PLT 295.0 06/01/2013   Lab Results  Component Value Date   TSH 2.40 06/01/2013   Lab Results  Component Value Date   CHOL 175 06/01/2013   CHOL 182 06/28/2010   Lab Results  Component Value Date   HDL 68.60 06/01/2013   HDL 78.46 06/28/2010   Lab Results  Component Value Date   LDLCALC 84 06/01/2013   LDLCALC 93 06/28/2010   Lab Results  Component Value Date   TRIG 110.0 06/01/2013   TRIG 120.0 06/28/2010   Lab Results  Component Value Date   CHOLHDL 3 06/01/2013   CHOLHDL 3 06/28/2010   No results found for this basename: LDLDIRECT  very good cholesterol profile - she thinks it is hereditary   Review of Systems Review of Systems  Constitutional: Negative for fever,  appetite change, fatigue and unexpected weight change.  Eyes: Negative for pain and visual disturbance.  Respiratory: Negative for cough and shortness of breath.   Cardiovascular: Negative for cp or palpitations    Gastrointestinal: Negative for nausea, diarrhea and constipation.  Genitourinary: Negative for urgency and frequency.  Skin: Negative for pallor or rash   Neurological: Negative for weakness, light-headedness, numbness and headaches.  Hematological: Negative for adenopathy. Does not bruise/bleed easily.  Psychiatric/Behavioral: Negative for dysphoric mood. The patient is not nervous/anxious.         Objective:   Physical Exam  Constitutional: She appears well-developed and well-nourished. No distress.  HENT:  Head: Normocephalic and atraumatic.  Right Ear: External ear normal.  Left Ear: External ear normal.    Mouth/Throat: Oropharynx is clear and moist.  Eyes: Conjunctivae and EOM are normal. Pupils are equal, round, and reactive to light. No scleral icterus.  Neck: Normal range of motion. Neck supple. No JVD present. Carotid bruit is not present. No thyromegaly present.  Cardiovascular: Normal rate, regular rhythm, normal heart sounds and intact distal pulses.  Exam reveals no gallop.   Pulmonary/Chest: Effort normal and breath sounds normal. No respiratory distress. She has no wheezes. She exhibits no tenderness.  Abdominal: Soft. Bowel sounds are normal. She exhibits no distension, no abdominal bruit and no mass. There is no tenderness.  Genitourinary: No breast swelling, tenderness, discharge or bleeding.  Breast exam: No mass, nodules, thickening, tenderness, bulging, retraction, inflamation, nipple discharge or skin changes noted.  No axillary or clavicular LA. Dense and large breast size noted with divots in shoulders  Musculoskeletal: Normal range of motion. She exhibits no edema and no tenderness.  Lymphadenopathy:    She has no cervical adenopathy.  Neurological: She is alert. She has normal reflexes. No cranial nerve deficit. She exhibits normal muscle tone. Coordination normal.  Skin: Skin is warm and dry. No rash noted. No erythema. No pallor.  Psychiatric: She has a normal mood and affect.          Assessment & Plan:

## 2013-06-05 NOTE — Progress Notes (Signed)
Pre-visit discussion using our clinic review tool. No additional management support is needed unless otherwise documented below in the visit note.  

## 2013-06-05 NOTE — Patient Instructions (Signed)
Keep taking good care of yourself  Labs look good Keep physically active - walking  Also eat a healthy diet and avoid the junk food

## 2013-06-07 NOTE — Assessment & Plan Note (Signed)
Reviewed health habits including diet and exercise and skin cancer prevention Reviewed appropriate screening tests for age  Also reviewed health mt list, fam hx and immunization status , as well as social and family history   See HPI Labs reviewed  

## 2013-08-14 ENCOUNTER — Other Ambulatory Visit: Payer: Self-pay

## 2013-11-03 ENCOUNTER — Other Ambulatory Visit: Payer: Self-pay | Admitting: Plastic Surgery

## 2013-12-01 HISTORY — PX: BREAST REDUCTION SURGERY: SHX8

## 2013-12-07 ENCOUNTER — Telehealth: Payer: Self-pay | Admitting: *Deleted

## 2013-12-07 NOTE — Telephone Encounter (Signed)
Spoke with patient and scheduled high risk appointment for 12/15/13 at 58 with Dr. Grayland Ormond.

## 2013-12-15 ENCOUNTER — Ambulatory Visit (HOSPITAL_BASED_OUTPATIENT_CLINIC_OR_DEPARTMENT_OTHER): Payer: Medicare Other | Admitting: Oncology

## 2013-12-15 ENCOUNTER — Encounter: Payer: Self-pay | Admitting: Oncology

## 2013-12-15 VITALS — BP 159/95 | HR 71 | Temp 98.4°F | Resp 18 | Ht 60.0 in | Wt 140.0 lb

## 2013-12-15 DIAGNOSIS — N6089 Other benign mammary dysplasias of unspecified breast: Secondary | ICD-10-CM

## 2013-12-15 DIAGNOSIS — Z1239 Encounter for other screening for malignant neoplasm of breast: Secondary | ICD-10-CM

## 2013-12-15 DIAGNOSIS — Z803 Family history of malignant neoplasm of breast: Secondary | ICD-10-CM

## 2013-12-15 NOTE — Progress Notes (Signed)
[facility]  [phone] [phone]     ID: [name] OB: Apr 13, 1946  MR#: [id number]  ZHY#:[id number]  Patient Care Team: Abner Greenspan, MD as PCP - General  CHIEF COMPLAINT:  Chief Complaint  Patient presents with  . New Evaluation    BREAST CANCER HISTORY: None, but incidental finding of atypical lobular hyperplasia which places patient at approximately 4-5 times risk of developing invasive breast cancer.  INTERVAL HISTORY: Patient is a 68 year old female who recently had bilateral breast reduction surgery to relieve neck and back pain. Incidentally on her pathology report she was found to have atypical lobular hyperplasia. She currently feels well and is recovered from her surgery. She has no neurologic complaints. She denies any recent fevers or illnesses. She has a good appetite and denies weight loss. She denies any chest pain or shortness of breath. She denies any nausea, vomiting, constipation, or diarrhea. She has no urinary complaints. Patient feels at her baseline and offers no specific complaints today.  REVIEW OF SYSTEMS:  As per HPI. Otherwise, 10 point system review was negative.  PAST MEDICAL HISTORY: Past Medical History  Diagnosis Date  . Fever blister   . Overweight   . Back pain     due to large breasts  . No pertinent past medical history   . GERD (gastroesophageal reflux disease)     PAST SURGICAL HISTORY: Past Surgical History  Procedure Laterality Date  . Bunionectomy  1988  . Cataract surgery  2010  . Cesarean section    . Tonsillectomy    . Mass excision  06/08/2011    Procedure: EXCISION MASS;  Surgeon: Cammie Sickle., MD;  Location: [facility];  Service: Orthopedics;  Laterality: Right;  excisional biopsy of exostosis right thumb    FAMILY HISTORY Family History  Problem Relation Age of Onset  . Asthma Mother   . Cancer Father     brain tumor  . Heart disease Maternal Grandfather      MI    GYNECOLOGIC HISTORY:  No LMP recorded. Patient is postmenopausal.      ADVANCED DIRECTIVES:    HEALTH MAINTENANCE: History  Substance Use Topics  . Smoking status: Former Smoker    Quit date: 06/06/1973  . Smokeless tobacco: Never Used     Comment: quit 1976  . Alcohol Use: Yes     Comment: occasional wine/alcohol     Colonoscopy:  PAP:  Bone density:  Lipid panel:  Allergies  Allergen Reactions  . Penicillins     REACTION: Pt said not sure if she was allergic  to penicillin. Pt said 86 + years ago was told allergic to penicillin. Not sure what type reaction.    Current Outpatient Prescriptions  Medication Sig Dispense Refill  . Cholecalciferol (VITAMIN D) 1000 UNITS capsule Take 1,000 Units by mouth daily.        . Multiple Vitamin (MULTIVITAMIN) tablet Take 1 tablet by mouth daily.        Marland Kitchen estradiol (ESTRACE) 0.1 MG/GM vaginal cream Use 1-2 cm in applicator intravaginally twice a week  42.5 g  3  . triamcinolone cream (KENALOG) 0.1 % Apply topically 2 (two) times daily as needed.  30 g  0   No current facility-administered medications for this visit.    OBJECTIVE: Filed Vitals:   12/15/13 1042  BP: 159/95  Pulse: 71  Temp: 98.4 F (36.9 C)  Resp: 18     Body mass index  is 27.34 kg/(m^2).    ECOG FS:0 - Asymptomatic  General: Well-developed, well-nourished, no acute distress. Eyes: Pink conjunctiva, anicteric sclera. HEENT: Normocephalic, moist mucous membranes, clear oropharnyx. Lungs: Clear to auscultation bilaterally. Heart: Regular rate and rhythm. No rubs, murmurs, or gallops. Abdomen: Soft, nontender, nondistended. No organomegaly noted, normoactive bowel sounds. Breasts: Patient requested exam be deferred today. Musculoskeletal: No edema, cyanosis, or clubbing. Neuro: Alert, answering all questions appropriately. Cranial nerves grossly intact. Skin: No rashes or petechiae noted. Psych: Normal affect.    LAB RESULTS:  CMP       Component Value Date/Time   NA 141 06/01/2013 0815   K 4.4 06/01/2013 0815   CL 107 06/01/2013 0815   CO2 28 06/01/2013 0815   GLUCOSE 94 06/01/2013 0815   BUN 12 06/01/2013 0815   CREATININE 0.8 06/01/2013 0815   CALCIUM 9.2 06/01/2013 0815   PROT 6.7 06/01/2013 0815   ALBUMIN 3.8 06/01/2013 0815   AST 15 06/01/2013 0815   ALT 15 06/01/2013 0815   ALKPHOS 50 06/01/2013 0815   BILITOT 0.4 06/01/2013 0815   GFRNONAA 81.97 06/28/2010 0835    I No results found for this basename: SPEP, UPEP,  kappa and lambda light chains    Lab Results  Component Value Date   WBC 6.2 06/01/2013   NEUTROABS 3.5 06/01/2013   HGB 13.4 06/01/2013   HCT 39.0 06/01/2013   MCV 90.3 06/01/2013   PLT 295.0 06/01/2013      Chemistry      Component Value Date/Time   NA 141 06/01/2013 0815   K 4.4 06/01/2013 0815   CL 107 06/01/2013 0815   CO2 28 06/01/2013 0815   BUN 12 06/01/2013 0815   CREATININE 0.8 06/01/2013 0815      Component Value Date/Time   CALCIUM 9.2 06/01/2013 0815   ALKPHOS 50 06/01/2013 0815   AST 15 06/01/2013 0815   ALT 15 06/01/2013 0815   BILITOT 0.4 06/01/2013 0815       No results found for this basename: LABCA2    No components found with this basename: LABCA125    No results found for this basename: INR,  in the last 168 hours  Urinalysis    Component Value Date/Time   BILIRUBINUR neg 11/13/2011 1147   PROTEINUR trace 11/13/2011 1147   UROBILINOGEN 0.2 11/13/2011 1147   NITRITE neg 11/13/2011 1147   LEUKOCYTESUR Negative 11/13/2011 1147    STUDIES: No results found.  ASSESSMENT: 68 y.o.   PLAN:  The patient has a good understanding of the overall plan. She agrees with it. She knows the goal of treatment in he case is cure. She will call with any problems that may develop before their next visit here.  Lloyd Huger, MD   12/15/2013 11:26 AM    [facility]  [phone] Fax:(336) [phone]     ID: [name] OB: May 24, 1946  MR#:  [id number]  ZWC#:[id number]  Patient Care Team: Abner Greenspan, MD as PCP - General  CHIEF COMPLAINT:  Chief Complaint  Patient presents with  . New Evaluation    BREAST CANCER HISTORY:  INTERVAL HISTORY:  REVIEW OF SYSTEMS:  As per HPI. Otherwise, 10 point system review was negative.  PAST MEDICAL HISTORY: Past Medical History  Diagnosis Date  . Fever blister   . Overweight   . Back pain     due to large breasts  . No pertinent past medical history   . GERD (gastroesophageal reflux  disease)     PAST SURGICAL HISTORY: Past Surgical History  Procedure Laterality Date  . Bunionectomy  1988  . Cataract surgery  2010  . Cesarean section    . Tonsillectomy    . Mass excision  06/08/2011    Procedure: EXCISION MASS;  Surgeon: Cammie Sickle., MD;  Location: [facility];  Service: Orthopedics;  Laterality: Right;  excisional biopsy of exostosis right thumb    FAMILY HISTORY Family History  Problem Relation Age of Onset  . Asthma Mother   . Cancer Father     brain tumor  . Heart disease Maternal Grandfather     MI    GYNECOLOGIC HISTORY:  No LMP recorded. Patient is postmenopausal.      ADVANCED DIRECTIVES:    HEALTH MAINTENANCE: History  Substance Use Topics  . Smoking status: Former Smoker    Quit date: 06/06/1973  . Smokeless tobacco: Never Used     Comment: quit 1976  . Alcohol Use: Yes     Comment: occasional wine/alcohol     Colonoscopy:  PAP:  Bone density:  Lipid panel:  Allergies  Allergen Reactions  . Penicillins     REACTION: Pt said not sure if she was allergic  to penicillin. Pt said 27 + years ago was told allergic to penicillin. Not sure what type reaction.    Current Outpatient Prescriptions  Medication Sig Dispense Refill  . Cholecalciferol (VITAMIN D) 1000 UNITS capsule Take 1,000 Units by mouth daily.        . Multiple Vitamin (MULTIVITAMIN) tablet Take 1 tablet by mouth daily.        Marland Kitchen estradiol (ESTRACE) 0.1  MG/GM vaginal cream Use 1-2 cm in applicator intravaginally twice a week  42.5 g  3  . triamcinolone cream (KENALOG) 0.1 % Apply topically 2 (two) times daily as needed.  30 g  0   No current facility-administered medications for this visit.    OBJECTIVE: Filed Vitals:   12/15/13 1042  BP: 159/95  Pulse: 71  Temp: 98.4 F (36.9 C)  Resp: 18     Body mass index is 27.34 kg/(m^2).    ECOG FS:0 - Asymptomatic  General: Well-developed, well-nourished, no acute distress. Eyes: Pink conjunctiva, anicteric sclera. HEENT: Normocephalic, moist mucous membranes, clear oropharnyx. Lungs: Clear to auscultation bilaterally. Heart: Regular rate and rhythm. No rubs, murmurs, or gallops. Abdomen: Soft, nontender, nondistended. No organomegaly noted, normoactive bowel sounds. Musculoskeletal: No edema, cyanosis, or clubbing. Neuro: Alert, answering all questions appropriately. Cranial nerves grossly intact. Skin: No rashes or petechiae noted. Psych: Normal affect. Lymphatics: No cervical, calvicular, axillary or inguinal LAD.   LAB RESULTS:  CMP     Component Value Date/Time   NA 141 06/01/2013 0815   K 4.4 06/01/2013 0815   CL 107 06/01/2013 0815   CO2 28 06/01/2013 0815   GLUCOSE 94 06/01/2013 0815   BUN 12 06/01/2013 0815   CREATININE 0.8 06/01/2013 0815   CALCIUM 9.2 06/01/2013 0815   PROT 6.7 06/01/2013 0815   ALBUMIN 3.8 06/01/2013 0815   AST 15 06/01/2013 0815   ALT 15 06/01/2013 0815   ALKPHOS 50 06/01/2013 0815   BILITOT 0.4 06/01/2013 0815   GFRNONAA 81.97 06/28/2010 0835    I No results found for this basename: SPEP, UPEP,  kappa and lambda light chains    Lab Results  Component Value Date   WBC 6.2 06/01/2013   NEUTROABS 3.5 06/01/2013   HGB 13.4 06/01/2013  HCT 39.0 06/01/2013   MCV 90.3 06/01/2013   PLT 295.0 06/01/2013      Chemistry      Component Value Date/Time   NA 141 06/01/2013 0815   K 4.4 06/01/2013 0815   CL 107 06/01/2013 0815   CO2 28 06/01/2013 0815   BUN 12  06/01/2013 0815   CREATININE 0.8 06/01/2013 0815      Component Value Date/Time   CALCIUM 9.2 06/01/2013 0815   ALKPHOS 50 06/01/2013 0815   AST 15 06/01/2013 0815   ALT 15 06/01/2013 0815   BILITOT 0.4 06/01/2013 0815       No results found for this basename: LABCA2    No components found with this basename: LABCA125    No results found for this basename: INR,  in the last 168 hours  Urinalysis    Component Value Date/Time   BILIRUBINUR neg 11/13/2011 1147   PROTEINUR trace 11/13/2011 1147   UROBILINOGEN 0.2 11/13/2011 1147   NITRITE neg 11/13/2011 1147   LEUKOCYTESUR Negative 11/13/2011 1147    STUDIES: No results found.  ASSESSMENT: 68 y.o. with incidental finding of atypical lobular hyperplasia.  PLAN:  1. Incidental finding of atypical lobular hyperplasia: Patient is in approximately 4-5 times higher risk for developing invasive breast cancer given her recent diagnosis. She also has a sister who had breast cancer in her 19s and 2 maternal aunts with breast cancer. After lengthy discussion of consideration of tamoxifen chemotherapy prophylaxis, patient has decided that she does not wish to pursue this option. She has agreed that she should have regular breast exams with her primary care physician every 6 months as well as continue her yearly mammograms. No followup is necessary in the [place]. Patient expressed understanding and was in agreement with this plan.   Lloyd Huger, MD   12/15/2013 11:26 AM

## 2014-01-19 ENCOUNTER — Other Ambulatory Visit: Payer: Self-pay

## 2014-01-19 DIAGNOSIS — Z9889 Other specified postprocedural states: Secondary | ICD-10-CM

## 2014-01-19 DIAGNOSIS — Z1231 Encounter for screening mammogram for malignant neoplasm of breast: Secondary | ICD-10-CM

## 2014-02-22 ENCOUNTER — Telehealth: Payer: Self-pay | Admitting: Family Medicine

## 2014-02-22 MED ORDER — TRIAMCINOLONE ACETONIDE 0.1 % EX CREA: TOPICAL_CREAM | Freq: Two times a day (BID) | CUTANEOUS | Status: DC | PRN

## 2014-02-22 NOTE — Telephone Encounter (Signed)
I sent the kenalog cream to her pharmacy F/u if no improvement

## 2014-02-22 NOTE — Telephone Encounter (Signed)
Patient Information:  Caller Name: Cloe  Phone: [phone]  Patient: Bailey Stephens, Bailey Stephens  Gender: Female  DOB: August 09, 1945  Age: 67 Years  PCP: [facility], [facility], [facility])  Office Follow Up:  Does the office need to follow up with this patient?: Yes  Instructions For The Office: Office please review and call pt back with further instructions if cream will be called in or if MD prefers appt  RN Note:  Pt states that she prefers cream to be called in without an appt; she states it is not that bad right now but would like to have the cream called in to help "prevent" it from getting worse. [facility] [address] is aware and they are faxing a request for the refill to the office,  Office please review and call pt back with further instructions if cream will be called in or if MD prefers appt  Symptoms  Reason For Call & Symptoms: Pt is calling and states that she needs a refill for her poison ivy cream;  doesn't know the name of it;  poison ivy rash started on 02/21/14; she states that she is very allergic and really needs to get the cream started right away; rash is located to her left arm and left neck; in the past the poison ivy spread to face and she is trying to prevent that; Kenalog is noted in chart but she is usure if that was the Rx called in for the poison ivy in the past  Reviewed Health History In EMR: Yes  Reviewed Medications In EMR: Yes  Reviewed Allergies In EMR: Yes  Reviewed Surgeries / Procedures: Yes  Date of Onset of Symptoms: 02/21/2014  Treatments Tried: Washed with dawn dish soap and OTC cream  Treatments Tried Worked: No  Guideline(s) Used:  Poison Ivy - [employer] or [employer]  Disposition Per Guideline:   See Today in Office  Reason For Disposition Reached:   Severe Poison Ivy reaction in the past  Advice Given:  Hydrocortisone Cream for Itching:   Apply 1% hydrocortisone cream 4 times a day to reduce itching. Use it for 5 days.  Keep the cream in the refrigerator  (Reason: it feels better if applied cold).  Apply Cold to the Area:  Soak the involved area in cool water for 20 minutes or massage it with an ice cube as often as necessary to reduce itching and oozing.  Call Back If:  Rash lasts longer than 3 weeks  It looks infected  You become worse.  Patient Refused Recommendation:  Patient Requests Prescription  Office please review and call pt back with further instructions if cream will be called in or if MD prefers appt

## 2014-02-22 NOTE — Telephone Encounter (Signed)
Pt notified Rx sent to pharmacy and to f/u if no improvement  

## 2014-02-24 ENCOUNTER — Ambulatory Visit
Admission: RE | Admit: 2014-02-24 | Discharge: 2014-02-24 | Disposition: A | Discharge status: Home / Self Care | Payer: 59 | Source: Ambulatory Visit

## 2014-02-24 DIAGNOSIS — Z9889 Other specified postprocedural states: Secondary | ICD-10-CM

## 2014-02-24 DIAGNOSIS — Z1231 Encounter for screening mammogram for malignant neoplasm of breast: Secondary | ICD-10-CM

## 2014-02-26 ENCOUNTER — Other Ambulatory Visit: Payer: Self-pay | Admitting: Family Medicine

## 2014-04-23 ENCOUNTER — Ambulatory Visit (INDEPENDENT_AMBULATORY_CARE_PROVIDER_SITE_OTHER): Payer: 59

## 2014-04-23 DIAGNOSIS — Z23 Encounter for immunization: Secondary | ICD-10-CM

## 2014-06-02 ENCOUNTER — Other Ambulatory Visit (INDEPENDENT_AMBULATORY_CARE_PROVIDER_SITE_OTHER): Payer: 59

## 2014-06-02 ENCOUNTER — Telehealth (INDEPENDENT_AMBULATORY_CARE_PROVIDER_SITE_OTHER): Payer: 59 | Admitting: Family Medicine

## 2014-06-02 DIAGNOSIS — Z0000 Encounter for general adult medical examination without abnormal findings: Secondary | ICD-10-CM

## 2014-06-02 DIAGNOSIS — Z131 Encounter for screening for diabetes mellitus: Secondary | ICD-10-CM

## 2014-06-02 DIAGNOSIS — Z13220 Encounter for screening for lipoid disorders: Secondary | ICD-10-CM

## 2014-06-02 LAB — CBC WITH DIFFERENTIAL/PLATELET
BASOS PCT: 0.4 % (ref 0.0–3.0)
Basophils Absolute: 0 10*3/uL (ref 0.0–0.1)
EOS ABS: 0.2 10*3/uL (ref 0.0–0.7)
Eosinophils Relative: 3 % (ref 0.0–5.0)
HCT: 39 % (ref 36.0–46.0)
HEMOGLOBIN: 12.8 g/dL (ref 12.0–15.0)
LYMPHS PCT: 29.4 % (ref 12.0–46.0)
Lymphs Abs: 1.8 10*3/uL (ref 0.7–4.0)
MCHC: 32.9 g/dL (ref 30.0–36.0)
MCV: 92.4 fl (ref 78.0–100.0)
Monocytes Absolute: 0.5 10*3/uL (ref 0.1–1.0)
Monocytes Relative: 7.6 % (ref 3.0–12.0)
NEUTROS ABS: 3.6 10*3/uL (ref 1.4–7.7)
Neutrophils Relative %: 59.6 % (ref 43.0–77.0)
Platelets: 284 10*3/uL (ref 150.0–400.0)
RBC: 4.22 Mil/uL (ref 3.87–5.11)
RDW: 13.9 % (ref 11.5–15.5)
WBC: 6.1 10*3/uL (ref 4.0–10.5)

## 2014-06-02 NOTE — Telephone Encounter (Signed)
-----   Message from Rich Reining sent at 06/02/2014 10:48 AM EST ----- Regarding: Pt came today for labs, need orders. Pt came in for cpx labs this am, need orders.   Thanks Aniceto Boss

## 2014-06-03 LAB — COMPREHENSIVE METABOLIC PANEL
ALBUMIN: 4 g/dL (ref 3.5–5.2)
ALT: 15 U/L (ref 0–35)
AST: 20 U/L (ref 0–37)
Alkaline Phosphatase: 59 U/L (ref 39–117)
BUN: 14 mg/dL (ref 6–23)
CO2: 26 meq/L (ref 19–32)
CREATININE: 0.9 mg/dL (ref 0.4–1.2)
Calcium: 9.1 mg/dL (ref 8.4–10.5)
Chloride: 106 mEq/L (ref 96–112)
GFR: 70.13 mL/min (ref >60.00)
GLUCOSE: 89 mg/dL (ref 70–99)
Potassium: 4.1 mEq/L (ref 3.5–5.1)
Sodium: 138 mEq/L (ref 135–145)
Total Bilirubin: 0.6 mg/dL (ref 0.2–1.2)
Total Protein: 6.8 g/dL (ref 6.0–8.3)

## 2014-06-03 LAB — LIPID PANEL
Cholesterol: 183 mg/dL (ref 0–200)
HDL: 59.7 mg/dL (ref >39.00)
LDL CALC: 105 mg/dL — AB (ref 0–99)
NONHDL: 123.3
Total CHOL/HDL Ratio: 3
Triglycerides: 94 mg/dL (ref 0.0–149.0)
VLDL: 18.8 mg/dL (ref 0.0–40.0)

## 2014-06-03 LAB — TSH: TSH: 2 u[IU]/mL (ref 0.35–4.50)

## 2014-06-07 ENCOUNTER — Ambulatory Visit (INDEPENDENT_AMBULATORY_CARE_PROVIDER_SITE_OTHER): Payer: Medicare Other | Admitting: Family Medicine

## 2014-06-07 ENCOUNTER — Telehealth: Payer: Self-pay | Admitting: Family Medicine

## 2014-06-07 ENCOUNTER — Other Ambulatory Visit: Payer: Self-pay | Admitting: Family Medicine

## 2014-06-07 ENCOUNTER — Encounter: Payer: Self-pay | Admitting: Family Medicine

## 2014-06-07 VITALS — BP 130/80 | HR 65 | Temp 97.8°F | Ht 60.75 in | Wt 142.8 lb

## 2014-06-07 DIAGNOSIS — E2839 Other primary ovarian failure: Secondary | ICD-10-CM | POA: Insufficient documentation

## 2014-06-07 DIAGNOSIS — R897 Abnormal histological findings in specimens from other organs, systems and tissues: Secondary | ICD-10-CM | POA: Insufficient documentation

## 2014-06-07 DIAGNOSIS — Z0000 Encounter for general adult medical examination without abnormal findings: Secondary | ICD-10-CM

## 2014-06-07 DIAGNOSIS — Z13220 Encounter for screening for lipoid disorders: Secondary | ICD-10-CM

## 2014-06-07 DIAGNOSIS — E348 Other specified endocrine disorders: Secondary | ICD-10-CM

## 2014-06-07 DIAGNOSIS — Z131 Encounter for screening for diabetes mellitus: Secondary | ICD-10-CM

## 2014-06-07 DIAGNOSIS — Z23 Encounter for immunization: Secondary | ICD-10-CM

## 2014-06-07 MED ORDER — ESTRADIOL 0.1 MG/GM VA CREA: TOPICAL_CREAM | VAGINAL | Status: DC

## 2014-06-07 NOTE — Assessment & Plan Note (Signed)
Abn cells on path after breast reduction  Seen at the cancer center -decided against tamoxifen  Will come in for exam every 6 mo

## 2014-06-07 NOTE — Assessment & Plan Note (Signed)
Referred for dexa  Has been since 06 No falls or fx Disc opt D and ca intake and exercise

## 2014-06-07 NOTE — Assessment & Plan Note (Signed)
Reviewed nl labs Pt plans to work on wt loss this year

## 2014-06-07 NOTE — Telephone Encounter (Signed)
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

## 2014-06-07 NOTE — Telephone Encounter (Signed)
Please cancel current bone density order with dx code of E 34.8. [facility] cannot use that code they have to use estrogen deficiency, code E 28.39.   Myriam Jacobson from [facility] states she is sending you another order and to please sign off on new order.

## 2014-06-07 NOTE — Patient Instructions (Signed)
prevnar vaccine today  Stop at check out for bone density test referral  Labs look good  Avoid red meat/ fried foods/ egg yolks/ fatty breakfast meats/ butter, cheese and high fat dairy/ and shellfish   Follow up in 6 months for a breast exam

## 2014-06-07 NOTE — Assessment & Plan Note (Signed)
Reviewed health habits including diet and exercise and skin cancer prevention Reviewed appropriate screening tests for age  Also reviewed health mt list, fam hx and immunization status , as well as social and family history    See HPI Labs reviewed  Good habitss prevnar vaccine today

## 2014-06-07 NOTE — Assessment & Plan Note (Signed)
Disc goals for lipids and reasons to control them Rev labs with pt Rev low sat fat diet in detail   

## 2014-06-07 NOTE — Progress Notes (Signed)
Subjective:    Patient ID: Bailey Stephens, female    DOB: Apr 25, 1946, 68 y.o.   MRN: [id number]  HPI Here for annual medicare wellness visit in addition to chronic and acute medical problems  I have personally reviewed the Medicare Annual Wellness questionnaire and have noted 1. The patient's medical and social history 2. Their use of alcohol, tobacco or illicit drugs 3. Their current medications and supplements 4. The patient's functional ability including ADL's, fall risks, home safety risks and hearing or visual             impairment. 5. Diet and physical activities 6. Evidence for depression or mood disorders  The patients weight, height, BMI have been recorded in the chart and visual acuity is per eye clinic.  I have made referrals, counseling and provided education to the patient based review of the above and I have provided the pt with a written personalized care plan for preventive services.  Had a breast reduction in June and she is very happy she did it  On path she had some "precancerous cells" - and she went to the cancer center - disc poss of tamoxifen  She decided not to  Will need a breast exam every 6 months    See scanned forms.  Routine anticipatory guidance given to patient.  See health maintenance. Colon cancer screening 1/09 -- recall 10 years  Breast cancer screening 8/15 - yearly (see above re: path from breast reduction) - needs breast exam every 6 mo  Self breast exam -no changes that are new  Flu vaccine 10/15 Tetanus vaccine 2/11 Pneumovax 2/11  Zoster vaccine 11/13 dexa nl at 1/06 - interested in another for screening  Advance directive- has a living will and POA  Cognitive function addressed- see scanned forms- and if abnormal then additional documentation follows. - no significant issues   She is walking for exercise and plans to get some weight off  Diet is good overall - plans to cut back on sugar    PMH and SH reviewed  Meds, vitals, and  allergies reviewed.   ROS: See HPI.  Otherwise negative.      bp is up on first check today-- whitecoat   (fine at home)  BP Readings from Last 3 Encounters:  06/07/14 140/84  12/15/13 159/95  06/05/13 124/80    Wt is up 2 lb with bmi of 27  Cholesterol screen Lab Results  Component Value Date   CHOL 183 06/02/2014   CHOL 175 06/01/2013   CHOL 182 06/28/2010   Lab Results  Component Value Date   HDL 59.70 06/02/2014   HDL 68.60 06/01/2013   HDL 65.40 06/28/2010   Lab Results  Component Value Date   LDLCALC 105* 06/02/2014   LDLCALC 84 06/01/2013   LDLCALC 93 06/28/2010   Lab Results  Component Value Date   TRIG 94.0 06/02/2014   TRIG 110.0 06/01/2013   TRIG 120.0 06/28/2010   Lab Results  Component Value Date   CHOLHDL 3 06/02/2014   CHOLHDL 3 06/01/2013   CHOLHDL 3 06/28/2010   No results found for: LDLDIRECT   Her LDL went up and HDL went down - ratio is still very good  Eats red meat once per week  No fried foods  She eats a lot of cheese   Rest of labs look good   Glucose 89   Results for orders placed or performed in visit on 06/02/14  CBC with Differential  Result  Value Ref Range   WBC 6.1 4.0 - 10.5 K/uL   RBC 4.22 3.87 - 5.11 Mil/uL   Hemoglobin 12.8 12.0 - 15.0 g/dL   HCT 39.0 36.0 - 46.0 %   MCV 92.4 78.0 - 100.0 fl   MCHC 32.9 30.0 - 36.0 g/dL   RDW 13.9 11.5 - 15.5 %   Platelets 284.0 150.0 - 400.0 K/uL   Neutrophils Relative % 59.6 43.0 - 77.0 %   Lymphocytes Relative 29.4 12.0 - 46.0 %   Monocytes Relative 7.6 3.0 - 12.0 %   Eosinophils Relative 3.0 0.0 - 5.0 %   Basophils Relative 0.4 0.0 - 3.0 %   Neutro Abs 3.6 1.4 - 7.7 K/uL   Lymphs Abs 1.8 0.7 - 4.0 K/uL   Monocytes Absolute 0.5 0.1 - 1.0 K/uL   Eosinophils Absolute 0.2 0.0 - 0.7 K/uL   Basophils Absolute 0.0 0.0 - 0.1 K/uL  Comprehensive metabolic panel  Result Value Ref Range   Sodium 138 135 - 145 mEq/L   Potassium 4.1 3.5 - 5.1 mEq/L   Chloride 106 96 - 112  mEq/L   CO2 26 19 - 32 mEq/L   Glucose, Bld 89 70 - 99 mg/dL   BUN 14 6 - 23 mg/dL   Creatinine, Ser 0.9 0.4 - 1.2 mg/dL   Total Bilirubin 0.6 0.2 - 1.2 mg/dL   Alkaline Phosphatase 59 39 - 117 U/L   AST 20 0 - 37 U/L   ALT 15 0 - 35 U/L   Total Protein 6.8 6.0 - 8.3 g/dL   Albumin 4.0 3.5 - 5.2 g/dL   Calcium 9.1 8.4 - 10.5 mg/dL   GFR 70.13 >60.00 mL/min  Lipid panel  Result Value Ref Range   Cholesterol 183 0 - 200 mg/dL   Triglycerides 94.0 0.0 - 149.0 mg/dL   HDL 59.70 >39.00 mg/dL   VLDL 18.8 0.0 - 40.0 mg/dL   LDL Cholesterol 105 (H) 0 - 99 mg/dL   Total CHOL/HDL Ratio 3    NonHDL 123.30   TSH  Result Value Ref Range   TSH 2.00 0.35 - 4.50 uIU/mL     Patient Active Problem List   Diagnosis Date Noted  . Estrogen deficiency 06/07/2014  . Abnormal breast biopsy 06/07/2014  . Encounter for Medicare annual wellness exam 05/24/2013  . 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 Screening for lipoid disorders 02/12/2011  . Diabetes mellitus screening 02/12/2011  . GERD (gastroesophageal reflux disease) 02/12/2011   Past Medical History  Diagnosis Date  . Fever blister   . Overweight(278.02)   . Back pain     due to large breasts  . No pertinent past medical history   . GERD (gastroesophageal reflux disease)    Past Surgical History  Procedure Laterality Date  . Bunionectomy  1988  . Cataract surgery  2010  . Cesarean section    . Tonsillectomy    . Mass excision  06/08/2011    Procedure: EXCISION MASS;  Surgeon: Cammie Sickle., MD;  Location: [place];  Service: Orthopedics;  Laterality: Right;  excisional biopsy of exostosis right thumb  . Breast reduction surgery  12/01/13   History  Substance Use Topics  . Smoking status: Former Smoker    Quit date: 06/07/1975  . Smokeless tobacco: Never Used     Comment: quit 1976  . Alcohol Use: 0.0 oz/week    0 Not specified per week     Comment: occasional wine/alcohol  Family History    Problem Relation Age of Onset  . Asthma Mother   . Cancer Father     brain tumor  . Heart disease Maternal Grandfather     MI   Allergies  Allergen Reactions  . Penicillins     REACTION: Pt said not sure if she was allergic  to penicillin. Pt said 31 + years ago was told allergic to penicillin. Not sure what type reaction.   Current Outpatient Prescriptions on File Prior to Visit  Medication Sig Dispense Refill  . Cholecalciferol (VITAMIN D) 1000 UNITS capsule Take 1,000 Units by mouth daily.      . Multiple Vitamin (MULTIVITAMIN) tablet Take 1 tablet by mouth daily.      Marland Kitchen triamcinolone cream (KENALOG) 0.1 % APPLY TOPICALLY 2 (TWO) TIMES DAILY AS NEEDED. TO AFFECTED AREAS 30 g 0   No current facility-administered medications on file prior to visit.    Review of Systems Review of Systems  Constitutional: Negative for fever, appetite change, fatigue and unexpected weight change.  Eyes: Negative for pain and visual disturbance.  Respiratory: Negative for cough and shortness of breath.   Cardiovascular: Negative for cp or palpitations    Gastrointestinal: Negative for nausea, diarrhea and constipation.  Genitourinary: Negative for urgency and frequency.  Skin: Negative for pallor or rash   Neurological: Negative for weakness, light-headedness, numbness and headaches.  Hematological: Negative for adenopathy. Does not bruise/bleed easily.  Psychiatric/Behavioral: Negative for dysphoric mood. The patient is not nervous/anxious.         Objective:   Physical Exam  Constitutional: She appears well-developed and well-nourished. No distress.  HENT:  Head: Normocephalic and atraumatic.  Right Ear: External ear normal.  Left Ear: External ear normal.  Nose: Nose normal.  Mouth/Throat: Oropharynx is clear and moist.  Eyes: Conjunctivae and EOM are normal. Pupils are equal, round, and reactive to light. Right eye exhibits no discharge. Left eye exhibits no discharge. No scleral  icterus.  Neck: Normal range of motion. Neck supple. No JVD present. No thyromegaly present.  Cardiovascular: Normal rate, regular rhythm, normal heart sounds and intact distal pulses.  Exam reveals no gallop.   Pulmonary/Chest: Effort normal and breath sounds normal. No respiratory distress. She has no wheezes. She has no rales.  Abdominal: Soft. Bowel sounds are normal. She exhibits no distension and no mass. There is no tenderness.  Genitourinary:  Breast exam: No mass, nodules, thickening, tenderness, bulging, retraction, inflamation, nipple discharge or skin changes noted.  No axillary or clavicular LA.     Post op changes seen - scars are healing well from breast reduction  Musculoskeletal: She exhibits no edema or tenderness.  No kyphosis   Lymphadenopathy:    She has no cervical adenopathy.  Neurological: She is alert. She has normal reflexes. No cranial nerve deficit. She exhibits normal muscle tone. Coordination normal.  Skin: Skin is warm and dry. No rash noted. No erythema. No pallor.  Psychiatric: She has a normal mood and affect.          Assessment & Plan:   Problem List Items Addressed This Visit      Other   Abnormal breast biopsy    Abn cells on path after breast reduction  Seen at the cancer center -decided against tamoxifen  Will come in for exam every 6 mo     Diabetes mellitus screening    Reviewed nl labs Pt plans to work on wt loss this year  Encounter for Medicare annual wellness exam - Primary    Reviewed health habits including diet and exercise and skin cancer prevention Reviewed appropriate screening tests for age  Also reviewed health mt list, fam hx and immunization status , as well as social and family history    See HPI Labs reviewed  Good habitss prevnar vaccine today    Estrogen deficiency    Referred for dexa  Has been since 06 No falls or fx Disc opt D and ca intake and exercise     Screening for lipoid disorders    Disc goals  for lipids and reasons to control them Rev labs with pt Rev low sat fat diet in detail      Other Visit Diagnoses    Need for vaccination with 13-polyvalent pneumococcal conjugate vaccine        Relevant Orders       Pneumococcal conjugate vaccine 13-valent (Completed)

## 2014-07-08 ENCOUNTER — Ambulatory Visit
Admission: RE | Admit: 2014-07-08 | Discharge: 2014-07-08 | Disposition: A | Discharge status: Home / Self Care | Payer: Medicare Other | Source: Ambulatory Visit | Attending: Family Medicine | Admitting: Family Medicine

## 2014-07-08 DIAGNOSIS — E2839 Other primary ovarian failure: Secondary | ICD-10-CM

## 2014-07-08 LAB — HM DEXA SCAN: HM DEXA SCAN: BORDERLINE

## 2014-07-15 ENCOUNTER — Encounter: Payer: Self-pay | Admitting: *Deleted

## 2014-07-15 ENCOUNTER — Encounter: Payer: Self-pay | Admitting: Family Medicine

## 2014-07-15 DIAGNOSIS — M8580 Other specified disorders of bone density and structure, unspecified site: Secondary | ICD-10-CM | POA: Insufficient documentation

## 2014-11-22 ENCOUNTER — Other Ambulatory Visit: Payer: Self-pay | Admitting: Family Medicine

## 2014-12-07 ENCOUNTER — Ambulatory Visit (INDEPENDENT_AMBULATORY_CARE_PROVIDER_SITE_OTHER): Payer: Medicare Other | Admitting: Family Medicine

## 2014-12-07 ENCOUNTER — Encounter: Payer: Self-pay | Admitting: Family Medicine

## 2014-12-07 VITALS — BP 114/86 | HR 68 | Temp 98.1°F | Ht 60.75 in | Wt 137.8 lb

## 2014-12-07 DIAGNOSIS — N62 Hypertrophy of breast: Secondary | ICD-10-CM

## 2014-12-07 DIAGNOSIS — N6099 Unspecified benign mammary dysplasia of unspecified breast: Secondary | ICD-10-CM | POA: Insufficient documentation

## 2014-12-07 NOTE — Progress Notes (Signed)
Pre visit review using our clinic review tool, if applicable. No additional management support is needed unless otherwise documented below in the visit note. 

## 2014-12-07 NOTE — Progress Notes (Signed)
Subjective:    Patient ID: Bailey Stephens, female    DOB: [birth year], 69 y.o.   MRN: [id number]  HPI Here for f/u of atypical lobular hyperplasia (both breasts)  Incidental dx after her breast red Saw oncol Declined tamoxifen  Also fam hx of breast ca- sister and an aunt (M)  Self exam- no lumps  No discomfort or other problems    She is exercising regularly    Here for 6 mo breast check   Last mammogram 02/24/14 DIGITAL SCREENING BILATERAL MAMMOGRAM WITH 3D TOMO WITH CAD  DIGITAL BREAST TOMOSYNTHESIS  Digital breast tomosynthesis images are acquired in two projections. These images are reviewed in combination with the digital mammogram, confirming the findings below.  COMPARISON: Previous exam(s).  ACR Breast Density Category c: The breast tissue is heterogeneously dense, which may obscure small masses.  FINDINGS: There are no findings suspicious for malignancy. Images were processed with CAD.  IMPRESSION: No mammographic evidence of malignancy. A result letter of this screening mammogram will be mailed directly to the patient.  RECOMMENDATION: Screening mammogram in one year. (Code:SM-B-01Y)  BI-RADS CATEGORY 1: Negative.  Patient Active Problem List   Diagnosis Date Noted  . 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 Osteopenia 07/15/2014  . Estrogen deficiency 06/07/2014  . Abnormal breast biopsy 06/07/2014  . Encounter for Medicare annual wellness exam 05/24/2013  . 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 Screening for lipoid disorders 02/12/2011  . Diabetes mellitus screening 02/12/2011  . GERD (gastroesophageal reflux disease) 02/12/2011   Past Medical History  Diagnosis Date  . Fever blister   . Overweight(278.02)   . Back pain     due to large breasts  . No pertinent past medical history   . GERD (gastroesophageal reflux disease)    Past Surgical History  Procedure Laterality Date  . Bunionectomy  1988  .  Cataract surgery  2010  . Cesarean section    . Tonsillectomy    . Mass excision  06/08/2011    Procedure: EXCISION MASS;  Surgeon: Cammie Sickle., MD;  Location: [place];  Service: Orthopedics;  Laterality: Right;  excisional biopsy of exostosis right thumb  . Breast reduction surgery  12/01/13   History  Substance Use Topics  . Smoking status: Former Smoker    Quit date: 06/07/1975  . Smokeless tobacco: Never Used     Comment: quit 1976  . Alcohol Use: 0.0 oz/week    0 Standard drinks or equivalent per week     Comment: occasional wine/alcohol   Family History  Problem Relation Age of Onset  . Asthma Mother   . Cancer Father     brain tumor  . Heart disease Maternal Grandfather     MI   Allergies  Allergen Reactions  . Penicillins     REACTION: Pt said not sure if she was allergic  to penicillin. Pt said 27 + years ago was told allergic to penicillin. Not sure what type reaction.   Current Outpatient Prescriptions on File Prior to Visit  Medication Sig Dispense Refill  . Cholecalciferol (VITAMIN D) 1000 UNITS capsule Take 1,000 Units by mouth daily.      Marland Kitchen estradiol (ESTRACE) 0.1 MG/GM vaginal cream Use 1-2 cm in applicator intravaginally twice a week 42.5 g 3  . Multiple Vitamin (MULTIVITAMIN) tablet Take 1 tablet by mouth daily.      Marland Kitchen triamcinolone cream (KENALOG) 0.1 % APPLY TOPICALLYTO THE AFFECTED AREAS 2 (TWO) TIMES DAILY  AS NEEDED. 30 g 0   No current facility-administered medications on file prior to visit.     Review of Systems Review of Systems  Constitutional: Negative for fever, appetite change, fatigue and unexpected weight change.  Eyes: Negative for pain and visual disturbance.  Respiratory: Negative for cough and shortness of breath.   Cardiovascular: Negative for cp or palpitations    Gastrointestinal: Negative for nausea, diarrhea and constipation.  Genitourinary: Negative for urgency and frequency.  Skin: Negative for pallor or  rash   Neurological: Negative for weakness, light-headedness, numbness and headaches.  Hematological: Negative for adenopathy. Does not bruise/bleed easily.  Psychiatric/Behavioral: Negative for dysphoric mood. The patient is not nervous/anxious.         Objective:   Physical Exam  Constitutional: She appears well-developed and well-nourished. No distress.  overwt and well appearing   HENT:  Head: Normocephalic and atraumatic.  Eyes: Conjunctivae and EOM are normal. Pupils are equal, round, and reactive to light. No scleral icterus.  Neck: Normal range of motion. Neck supple.  Cardiovascular: Normal rate and regular rhythm.   Genitourinary: No breast swelling, tenderness, discharge or bleeding.  Breast exam: No mass, nodules, thickening, tenderness, bulging, retraction, inflamation, nipple discharge or skin changes noted.  No axillary or clavicular LA.    Dense tissue noted  Baseline scars from breast reduction noted   Lymphadenopathy:    She has no cervical adenopathy.  Neurological: She is alert.  Skin: Skin is warm and dry. No rash noted. No erythema. No pallor.  Psychiatric: She has a normal mood and affect.          Assessment & Plan:   Problem List Items Addressed This Visit    Atypical lobular hyperplasia of breast - Primary    See overview Here for 6 mo breast exam No abnormalities noted Enc regular self exams  No c/o  Mammogram planned for summer  Continue 6 mo f/u  Reviewed her last oncology note today

## 2014-12-07 NOTE — Patient Instructions (Signed)
Get your mammogram as planned in Aug or early sept  No changes on exam today Continue self exams  Schedule your annual exam for winter

## 2014-12-08 NOTE — Assessment & Plan Note (Signed)
See overview Here for 6 mo breast exam No abnormalities noted Enc regular self exams  No c/o  Mammogram planned for summer  Continue 6 mo f/u  Reviewed her last oncology note today

## 2015-01-24 ENCOUNTER — Other Ambulatory Visit: Payer: Self-pay

## 2015-01-24 DIAGNOSIS — Z1231 Encounter for screening mammogram for malignant neoplasm of breast: Secondary | ICD-10-CM

## 2015-02-07 ENCOUNTER — Other Ambulatory Visit: Payer: Self-pay | Admitting: Family Medicine

## 2015-02-07 NOTE — Telephone Encounter (Signed)
Please refill times one  

## 2015-02-07 NOTE — Telephone Encounter (Signed)
She was originally prescribed it back in 11/11/2012 for L ear pain, and has had refilled intermittently since. Is it ok to refill?

## 2015-02-07 NOTE — Telephone Encounter (Signed)
done

## 2015-03-01 ENCOUNTER — Ambulatory Visit: Payer: Medicare Other

## 2015-04-15 ENCOUNTER — Ambulatory Visit
Admission: RE | Admit: 2015-04-15 | Discharge: 2015-04-15 | Disposition: A | Discharge status: Home / Self Care | Payer: Medicare Other | Source: Ambulatory Visit

## 2015-04-15 DIAGNOSIS — Z1231 Encounter for screening mammogram for malignant neoplasm of breast: Secondary | ICD-10-CM

## 2015-04-18 ENCOUNTER — Other Ambulatory Visit: Payer: Self-pay | Admitting: Family Medicine

## 2015-04-18 DIAGNOSIS — R928 Other abnormal and inconclusive findings on diagnostic imaging of breast: Secondary | ICD-10-CM

## 2015-04-22 ENCOUNTER — Ambulatory Visit
Admission: RE | Admit: 2015-04-22 | Discharge: 2015-04-22 | Disposition: A | Discharge status: Home / Self Care | Payer: Medicare Other | Source: Ambulatory Visit | Attending: Family Medicine | Admitting: Family Medicine

## 2015-04-22 DIAGNOSIS — R928 Other abnormal and inconclusive findings on diagnostic imaging of breast: Secondary | ICD-10-CM

## 2015-04-22 LAB — HM MAMMOGRAPHY: HM MAMMO: NORMAL

## 2015-04-25 ENCOUNTER — Encounter: Payer: Self-pay | Admitting: Family Medicine

## 2015-06-02 ENCOUNTER — Telehealth: Payer: Self-pay | Admitting: Family Medicine

## 2015-06-02 ENCOUNTER — Telehealth (INDEPENDENT_AMBULATORY_CARE_PROVIDER_SITE_OTHER): Payer: Medicare Other | Admitting: Family Medicine

## 2015-06-02 DIAGNOSIS — Z0000 Encounter for general adult medical examination without abnormal findings: Secondary | ICD-10-CM | POA: Insufficient documentation

## 2015-06-02 DIAGNOSIS — E785 Hyperlipidemia, unspecified: Secondary | ICD-10-CM

## 2015-06-02 NOTE — Telephone Encounter (Signed)
-----   Message from Marchia Bond sent at 05/31/2015  2:46 PM EST ----- Regarding: Cpx labs Fri 12/2, need orders, Thanks! :-) Please order  future cpx labs for pt's upcoming lab appt. Thanks Aniceto Boss

## 2015-06-03 ENCOUNTER — Other Ambulatory Visit (INDEPENDENT_AMBULATORY_CARE_PROVIDER_SITE_OTHER): Payer: Medicare Other

## 2015-06-03 DIAGNOSIS — Z23 Encounter for immunization: Secondary | ICD-10-CM

## 2015-06-03 LAB — COMPREHENSIVE METABOLIC PANEL
ALBUMIN: 4.2 g/dL (ref 3.5–5.2)
ALT: 14 U/L (ref 0–35)
AST: 17 U/L (ref 0–37)
Alkaline Phosphatase: 53 U/L (ref 39–117)
BUN: 14 mg/dL (ref 6–23)
CALCIUM: 9.6 mg/dL (ref 8.4–10.5)
CHLORIDE: 107 meq/L (ref 96–112)
CO2: 28 mEq/L (ref 19–32)
CREATININE: 0.75 mg/dL (ref 0.40–1.20)
GFR: 80.79 mL/min (ref >60.00)
Glucose, Bld: 93 mg/dL (ref 70–99)
Potassium: 4.2 mEq/L (ref 3.5–5.1)
Sodium: 142 mEq/L (ref 135–145)
Total Bilirubin: 0.5 mg/dL (ref 0.2–1.2)
Total Protein: 7 g/dL (ref 6.0–8.3)

## 2015-06-03 LAB — LIPID PANEL
CHOLESTEROL: 170 mg/dL (ref 0–200)
HDL: 63.8 mg/dL (ref >39.00)
LDL Cholesterol: 88 mg/dL (ref 0–99)
NonHDL: 105.99
TRIGLYCERIDES: 89 mg/dL (ref 0.0–149.0)
Total CHOL/HDL Ratio: 3
VLDL: 17.8 mg/dL (ref 0.0–40.0)

## 2015-06-03 LAB — CBC WITH DIFFERENTIAL/PLATELET
BASOS PCT: 0.6 % (ref 0.0–3.0)
Basophils Absolute: 0 10*3/uL (ref 0.0–0.1)
EOS PCT: 2.3 % (ref 0.0–5.0)
Eosinophils Absolute: 0.2 10*3/uL (ref 0.0–0.7)
HEMATOCRIT: 39.9 % (ref 36.0–46.0)
HEMOGLOBIN: 13.5 g/dL (ref 12.0–15.0)
LYMPHS PCT: 27 % (ref 12.0–46.0)
Lymphs Abs: 1.9 10*3/uL (ref 0.7–4.0)
MCHC: 33.9 g/dL (ref 30.0–36.0)
MCV: 91.2 fl (ref 78.0–100.0)
MONOS PCT: 7 % (ref 3.0–12.0)
Monocytes Absolute: 0.5 10*3/uL (ref 0.1–1.0)
NEUTROS ABS: 4.3 10*3/uL (ref 1.4–7.7)
Neutrophils Relative %: 63.1 % (ref 43.0–77.0)
PLATELETS: 303 10*3/uL (ref 150.0–400.0)
RBC: 4.38 Mil/uL (ref 3.87–5.11)
RDW: 13.4 % (ref 11.5–15.5)
WBC: 6.9 10*3/uL (ref 4.0–10.5)

## 2015-06-03 LAB — TSH: TSH: 2.63 u[IU]/mL (ref 0.35–4.50)

## 2015-06-10 ENCOUNTER — Ambulatory Visit (INDEPENDENT_AMBULATORY_CARE_PROVIDER_SITE_OTHER): Payer: Medicare Other | Admitting: Family Medicine

## 2015-06-10 ENCOUNTER — Encounter: Payer: Self-pay | Admitting: Family Medicine

## 2015-06-10 VITALS — BP 130/80 | HR 68 | Temp 98.1°F | Ht 60.5 in | Wt 141.5 lb

## 2015-06-10 DIAGNOSIS — Z0000 Encounter for general adult medical examination without abnormal findings: Secondary | ICD-10-CM | POA: Diagnosis not present

## 2015-06-10 DIAGNOSIS — E785 Hyperlipidemia, unspecified: Secondary | ICD-10-CM

## 2015-06-10 DIAGNOSIS — N6099 Unspecified benign mammary dysplasia of unspecified breast: Secondary | ICD-10-CM

## 2015-06-10 DIAGNOSIS — M8580 Other specified disorders of bone density and structure, unspecified site: Secondary | ICD-10-CM

## 2015-06-10 MED ORDER — ESTRADIOL 0.1 MG/GM VA CREA: TOPICAL_CREAM | VAGINAL | Status: DC

## 2015-06-10 NOTE — Progress Notes (Signed)
Subjective:    Patient ID: Bailey Stephens, female    DOB: 1946/02/18, 69 y.o.   MRN: [id number]  HPI Here for annual medicare wellness visit as well as chronic/acute medical problems as well as annual preventative exam  I have personally reviewed the Medicare Annual Wellness questionnaire and have noted 1. The patient's medical and social history 2. Their use of alcohol, tobacco or illicit drugs 3. Their current medications and supplements 4. The patient's functional ability including ADL's, fall risks, home safety risks and hearing or visual             impairment. 5. Diet and physical activities 6. Evidence for depression or mood disorders  The patients weight, height, BMI have been recorded in the chart and visual acuity is per eye clinic.  I have made referrals, counseling and provided education to the patient based review of the above and I have provided the pt with a written personalized care plan for preventive services. Reviewed and updated provider list, see scanned forms.  Has been feeling fine overall   Had dermatology visit with screening -all was ok    See scanned forms.  Routine anticipatory guidance given to patient.  See health maintenance. Colon cancer screening 1/09 colonoscopy , normal - 10 year recall  Self breast exam -normal Breast cancer screening  - mm 10/16- had a call back for dense breasts and that was normal  Flu vaccine 12/16  Tetanus vaccine 2/11  Pneumovax 12/15 - completed both Zoster vaccine 1/13  dexa 1/16 -osteopenia , no falls or fractures , taking calcium and vitamin D  Hep C screening -does not think she is high risk  Advance directive-has a living will and POA  Cognitive function addressed- see scanned forms- and if abnormal then additional documentation follows. - forgets names occasionally , nothing major   PMH and SH reviewed  Meds, vitals, and allergies reviewed.   ROS: See HPI.  Otherwise negative.    Lipids Lab Results    Component Value Date   CHOL 170 06/03/2015   CHOL 183 06/02/2014   CHOL 175 06/01/2013   Lab Results  Component Value Date   HDL 63.80 06/03/2015   HDL 59.70 06/02/2014   HDL 68.60 06/01/2013   Lab Results  Component Value Date   LDLCALC 88 06/03/2015   LDLCALC 105* 06/02/2014   LDLCALC 84 06/01/2013   Lab Results  Component Value Date   TRIG 89.0 06/03/2015   TRIG 94.0 06/02/2014   TRIG 110.0 06/01/2013   Lab Results  Component Value Date   CHOLHDL 3 06/03/2015   CHOLHDL 3 06/02/2014   CHOLHDL 3 06/01/2013   No results found for: LDLDIRECT Very good profile/overall improved  Eats very little meat (beef)  Eats eggs for protein  Some shellfish (scallops)   She exercises regularly -walking 4 miles five times per week  Will go back to the gym as it gets colder    bp is up on first check today ([place])  Better on re check BP: 130/80 mmHg   BP Readings from Last 3 Encounters:  06/10/15 146/90  12/07/14 114/86  06/07/14 130/80    Wt is up 4 lb with bmi of 27 (is stable at home - was wearing heavy clothes)  She does want to loose  Overweight    estrace vaginal cream -still uses for atrophic vaginitis and it works well No gyn symptoms   Patient Active Problem List   Diagnosis Date Noted  .  Hyperlipidemia 06/02/2015  . Routine general medical examination at a health care facility 06/02/2015  . 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 Osteopenia 07/15/2014  . Estrogen deficiency 06/07/2014  . Abnormal breast biopsy 06/07/2014  . Encounter for Medicare annual wellness exam 05/24/2013  . 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 Screening for lipoid disorders 02/12/2011  . Diabetes mellitus screening 02/12/2011  . GERD (gastroesophageal reflux disease) 02/12/2011   Past Medical History  Diagnosis Date  . Fever blister   . Overweight(278.02)   . Back pain     due to large breasts  . No pertinent past medical history   . GERD  (gastroesophageal reflux disease)    Past Surgical History  Procedure Laterality Date  . Bunionectomy  1988  . Cataract surgery  2010  . Cesarean section    . Tonsillectomy    . Mass excision  06/08/2011    Procedure: EXCISION MASS;  Surgeon: Cammie Sickle., MD;  Location: [facility];  Service: Orthopedics;  Laterality: Right;  excisional biopsy of exostosis right thumb  . Breast reduction surgery  12/01/13   Social History  Substance Use Topics  . Smoking status: Former Smoker    Quit date: 06/07/1975  . Smokeless tobacco: Never Used     Comment: quit 1976  . Alcohol Use: 0.0 oz/week    0 Standard drinks or equivalent per week     Comment: occasional wine/alcohol   Family History  Problem Relation Age of Onset  . Asthma Mother   . Cancer Father     brain tumor  . Heart disease Maternal Grandfather     MI   Allergies  Allergen Reactions  . Penicillins     REACTION: Pt said not sure if she was allergic  to penicillin. Pt said 57 + years ago was told allergic to penicillin. Not sure what type reaction.   Current Outpatient Prescriptions on File Prior to Visit  Medication Sig Dispense Refill  . Calcium Carbonate-Vitamin D (CALCIUM + D PO) Take 1 capsule by mouth 2 (two) times daily.    . Cholecalciferol (VITAMIN D) 1000 UNITS capsule Take 1,000 Units by mouth daily.      . Multiple Vitamin (MULTIVITAMIN) tablet Take 1 tablet by mouth daily.      Marland Kitchen triamcinolone cream (KENALOG) 0.1 % APPLY TOPICALLYTO THE AFFECTED AREAS 2 (TWO) TIMES DAILY AS NEEDED. 30 g 0   No current facility-administered medications on file prior to visit.    Review of Systems Review of Systems  Constitutional: Negative for fever, appetite change, fatigue and unexpected weight change.  Eyes: Negative for pain and visual disturbance.  Respiratory: Negative for cough and shortness of breath.   Cardiovascular: Negative for cp or palpitations    Gastrointestinal: Negative for nausea,  diarrhea and constipation.  Genitourinary: Negative for urgency and frequency.  Skin: Negative for pallor or rash   Neurological: Negative for weakness, light-headedness, numbness and headaches.  Hematological: Negative for adenopathy. Does not bruise/bleed easily.  Psychiatric/Behavioral: Negative for dysphoric mood. The patient is not nervous/anxious.         Objective:   Physical Exam  Constitutional: She appears well-developed and well-nourished. No distress.  overwt and well app  HENT:  Head: Normocephalic and atraumatic.  Right Ear: External ear normal.  Left Ear: External ear normal.  Mouth/Throat: Oropharynx is clear and moist.  Eyes: Conjunctivae and EOM are normal. Pupils are equal, round, and reactive to light. No scleral icterus.  Neck: Normal range of motion. Neck supple. No JVD present. Carotid bruit is not present. No thyromegaly present.  Cardiovascular: Normal rate, regular rhythm, normal heart sounds and intact distal pulses.  Exam reveals no gallop.   Pulmonary/Chest: Effort normal and breath sounds normal. No respiratory distress. She has no wheezes. She exhibits no tenderness.  Abdominal: Soft. Bowel sounds are normal. She exhibits no distension, no abdominal bruit and no mass. There is no tenderness.  Genitourinary: No breast swelling, tenderness, discharge or bleeding.  Breast exam: No mass, nodules, thickening, tenderness, bulging, retraction, inflamation, nipple discharge or skin changes noted.  No axillary or clavicular LA.    Baseline dense breast tissue with healed scars from breast reduction surgery   Musculoskeletal: Normal range of motion. She exhibits no edema or tenderness.  No kyphosis   Lymphadenopathy:    She has no cervical adenopathy.  Neurological: She is alert. She has normal reflexes. No cranial nerve deficit. She exhibits normal muscle tone. Coordination normal.  Skin: Skin is warm and dry. No rash noted. No erythema. No pallor.  Fair  complexion Few skin tags  Psychiatric: She has a normal mood and affect.          Assessment & Plan:   Problem List Items Addressed This Visit      Musculoskeletal and Integument   Osteopenia - Primary    Rev dexa 1/16 No falls or fx On ca and D and exercises  Re check at the 2 y mark  Disc need for calcium/ vitamin D/ wt bearing exercise and bone density test every 2 y to monitor Disc safety/ fracture risk in detail          Other   Atypical lobular hyperplasia of breast    Nl exam today  Nl mammogram from fall Will continue breast exams every 6 mo       Encounter for Medicare annual wellness exam    Great lipid profile today- from improved diet Disc goals for lipids and reasons to control them Rev labs with pt Rev low sat fat diet in detail       Hyperlipidemia    Great lipid profile today- from improved diet Disc goals for lipids and reasons to control them Rev labs with pt Rev low sat fat diet in detail       Routine general medical examination at a health care facility    Reviewed health habits including diet and exercise and skin cancer prevention Reviewed appropriate screening tests for age  Also reviewed health mt list, fam hx and immunization status , as well as social and family history   See HPI Labs reviewed  Good habits- urged her to keep up the good work/working on wt loss

## 2015-06-10 NOTE — Patient Instructions (Addendum)
Take care of yourself  Labs look ok -cholesterol is great  Keep working on healthy diet and exercise  Blood pressure is good on 2nd check

## 2015-06-10 NOTE — Assessment & Plan Note (Signed)
Great lipid profile today- from improved diet Disc goals for lipids and reasons to control them Rev labs with pt Rev low sat fat diet in detail

## 2015-06-10 NOTE — Assessment & Plan Note (Signed)
Rev dexa 1/16 No falls or fx On ca and D and exercises  Re check at the 2 y mark  Disc need for calcium/ vitamin D/ wt bearing exercise and bone density test every 2 y to monitor Disc safety/ fracture risk in detail

## 2015-06-10 NOTE — Assessment & Plan Note (Signed)
Nl exam today  Nl mammogram from fall Will continue breast exams every 6 mo

## 2015-06-10 NOTE — Progress Notes (Signed)
Pre visit review using our clinic review tool, if applicable. No additional management support is needed unless otherwise documented below in the visit note. 

## 2015-06-10 NOTE — Assessment & Plan Note (Signed)
Reviewed health habits including diet and exercise and skin cancer prevention Reviewed appropriate screening tests for age  Also reviewed health mt list, fam hx and immunization status , as well as social and family history   See HPI Labs reviewed  Good habits- urged her to keep up the good work/working on wt loss

## 2015-12-05 ENCOUNTER — Other Ambulatory Visit: Payer: Self-pay | Admitting: Family Medicine

## 2015-12-06 NOTE — Telephone Encounter (Signed)
Please refill times 3 

## 2015-12-06 NOTE — Telephone Encounter (Signed)
Pt has CPE on 06/12/16, last filled on 02/07/15 #30g with 0 refills

## 2016-01-09 ENCOUNTER — Ambulatory Visit (INDEPENDENT_AMBULATORY_CARE_PROVIDER_SITE_OTHER): Payer: Medicare Other | Admitting: Primary Care

## 2016-01-09 ENCOUNTER — Encounter: Payer: Self-pay | Admitting: Primary Care

## 2016-01-09 VITALS — BP 132/78 | HR 57 | Temp 97.9°F | Ht 60.75 in | Wt 142.0 lb

## 2016-01-09 DIAGNOSIS — H9202 Otalgia, left ear: Secondary | ICD-10-CM

## 2016-01-09 NOTE — Patient Instructions (Signed)
Ear pain/Fullness: Try ibuprofen 600 mg three times daily as needed for pain and inflammation.   Start a daily antihistamine such as Zyrtec at bedtime to help dry out the ears.   You may also try a nasal steroid spray such as Flonase to help reduce irritation deep within the ear.  Please notify me if you develop fevers, discomfort to the left side of your face, sore throat, cough.   It was a pleasure meeting you!

## 2016-01-09 NOTE — Progress Notes (Signed)
Subjective:    Patient ID: Bailey Stephens, female    DOB: June 19, 1946, 70 y.o.   MRN: [id number]  HPI  Bailey Stephens is a 70 year old female who presents today with a chief complaint of ear pain and fullness. Her symptoms are located to the left ear and began Saturday morning this last weekend. She denies fevers, headaches, dizziness, sore throat, cough, facial swelling or discomfort. She used an ear wax kit over the weekend with improvement on Sunday, but woke up this morning noticing the discomfort again. Denies sick contacts.  Review of Systems  Constitutional: Negative for fever.  HENT: Positive for ear pain. Negative for ear discharge, sinus pressure and sore throat.   Respiratory: Negative for cough and shortness of breath.   Cardiovascular: Negative for chest pain.       Past Medical History  Diagnosis Date  . Fever blister   . Overweight(278.02)   . Back pain     due to large breasts  . No pertinent past medical history   . GERD (gastroesophageal reflux disease)      Social History   Social History  . Marital Status: Married    Spouse Name: N/A  . Number of Children: N/A  . Years of Education: N/A   Occupational History  . Not on file.   Social History Main Topics  . Smoking status: Former Smoker    Quit date: 06/07/1975  . Smokeless tobacco: Never Used     Comment: quit 1976  . Alcohol Use: 0.0 oz/week    0 Standard drinks or equivalent per week     Comment: occasional wine/alcohol  . Drug Use: No  . Sexual Activity: Yes   Other Topics Concern  . Not on file   Social History Narrative    Past Surgical History  Procedure Laterality Date  . Bunionectomy  1988  . Cataract surgery  2010  . Cesarean section    . Tonsillectomy    . Mass excision  06/08/2011    Procedure: EXCISION MASS;  Surgeon: Cammie Sickle., MD;  Location: [facility];  Service: Orthopedics;  Laterality: Right;  excisional biopsy of exostosis right thumb  . Breast  reduction surgery  12/01/13    Family History  Problem Relation Age of Onset  . Asthma Mother   . Cancer Father     brain tumor  . Heart disease Maternal Grandfather     MI    Allergies  Allergen Reactions  . Penicillins     REACTION: Pt said not sure if she was allergic  to penicillin. Pt said 45 + years ago was told allergic to penicillin. Not sure what type reaction.    Current Outpatient Prescriptions on File Prior to Visit  Medication Sig Dispense Refill  . Calcium Carbonate-Vitamin D (CALCIUM + D PO) Take 1 capsule by mouth 2 (two) times daily.    . Cholecalciferol (VITAMIN D) 1000 UNITS capsule Take 1,000 Units by mouth daily.      Marland Kitchen estradiol (ESTRACE) 0.1 MG/GM vaginal cream Use 1-2 cm in applicator intravaginally twice a week 42.5 g 3  . Multiple Vitamin (MULTIVITAMIN) tablet Take 1 tablet by mouth daily.      Marland Kitchen triamcinolone cream (KENALOG) 0.1 % APPLY TOPICALLYTO THE AFFECTED AREAS 2 (TWO) TIMES DAILY AS NEEDED. 30 g 3   No current facility-administered medications on file prior to visit.    BP 132/78 mmHg  Pulse 57  Temp(Src) 97.9 F (36.6  C) (Oral)  Ht 5' 0.75" (1.543 m)  Wt 142 lb (64.411 kg)  BMI 27.05 kg/m2  SpO2 96%    Objective:   Physical Exam  Constitutional: She appears well-nourished.  HENT:  Right Ear: Tympanic membrane and ear canal normal.  Left Ear: Tympanic membrane and ear canal normal.  Nose: Right sinus exhibits no maxillary sinus tenderness and no frontal sinus tenderness. Left sinus exhibits no maxillary sinus tenderness and no frontal sinus tenderness.  Mouth/Throat: Oropharynx is clear and moist.  Dullness to left TM, no erythema or s/s of infection  Eyes: Conjunctivae are normal.  Neck: Neck supple.  Cardiovascular: Normal rate and regular rhythm.   Pulmonary/Chest: Effort normal and breath sounds normal. She has no wheezes. She has no rales.  Lymphadenopathy:    She has no cervical adenopathy.  Skin: Skin is warm and dry.           Assessment & Plan:  Otalgia:  Located to left ear intermittently since Saturday. No fevers, facial pain, sore throat, etc.  Exam today without evidence of acute infection. Mild dullness to left TM. No cerumen impaction. Does not appear acutely ill.  Will treat with conservative measure for presumed eustation involvement.  Zyrtec HS, Flonase PRN, Ibuprofen PRN. Return precautions provided.  Sheral Flow, NP

## 2016-01-09 NOTE — Progress Notes (Signed)
Pre visit review using our clinic review tool, if applicable. No additional management support is needed unless otherwise documented below in the visit note. 

## 2016-02-29 ENCOUNTER — Encounter: Payer: Self-pay | Admitting: Family Medicine

## 2016-02-29 ENCOUNTER — Ambulatory Visit (INDEPENDENT_AMBULATORY_CARE_PROVIDER_SITE_OTHER): Payer: Medicare Other | Admitting: Family Medicine

## 2016-02-29 VITALS — BP 126/74 | HR 67 | Temp 98.0°F | Ht 60.75 in | Wt 141.5 lb

## 2016-02-29 DIAGNOSIS — N6099 Unspecified benign mammary dysplasia of unspecified breast: Secondary | ICD-10-CM

## 2016-02-29 DIAGNOSIS — L259 Unspecified contact dermatitis, unspecified cause: Secondary | ICD-10-CM | POA: Insufficient documentation

## 2016-02-29 DIAGNOSIS — N62 Hypertrophy of breast: Secondary | ICD-10-CM

## 2016-02-29 MED ORDER — TRIAMCINOLONE ACETONIDE 0.1 % EX CREA: TOPICAL_CREAM | CUTANEOUS | 3 refills | Status: DC

## 2016-02-29 NOTE — Progress Notes (Signed)
Pre visit review using our clinic review tool, if applicable. No additional management support is needed unless otherwise documented below in the visit note. 

## 2016-02-29 NOTE — Assessment & Plan Note (Signed)
Pt gets poison ivy dermatitis easily and keeps triamcinolone cream on hand in case of exposure Refilled today Enc to hire someone to keep it out of her yard  Also wear app clothes and bathe after outdoor work

## 2016-02-29 NOTE — Assessment & Plan Note (Signed)
Exam today-normal Enc monthly self exams mammo due in Sept F/u here in dec for next visit

## 2016-02-29 NOTE — Patient Instructions (Signed)
Breast exam is re assuring today  Get your mammogram next month as planned - the 3D if possible  Take care of yourself  Stay away from poison ivy  Keep the insect bite on your back clean with soap and water

## 2016-02-29 NOTE — Progress Notes (Signed)
Subjective:    Patient ID: Bailey Stephens, female    DOB: 06/20/1946, 70 y.o.   MRN: [id number]  HPI Here for a breast check   Also needs a refill of cream for poison ivy to have on hand - she gets it very easily  Last mammogram 10/16 normal  This was a 3D mammogram Has a hx of dense breasts   She did have breast reduction surgery in 2015  She has not noticed any changes   Was told she should have a breast exam - twice yearly by her last surgeon  Hx of atypical lobular hyperplasia  Was recommended tamoxifen- she decided against it   No new family hx  Sister had breast cancer at 67 - fine 5 years out   She will return a little later for her flu shot   Patient Active Problem List   Diagnosis Date Noted  . Contact dermatitis 02/29/2016  . Hyperlipidemia 06/02/2015  . Routine general medical examination at a health care facility 06/02/2015  . 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 Osteopenia 07/15/2014  . Estrogen deficiency 06/07/2014  . Abnormal breast biopsy 06/07/2014  . Encounter for Medicare annual wellness exam 05/24/2013  . 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 Screening for lipoid disorders 02/12/2011  . Diabetes mellitus screening 02/12/2011  . GERD (gastroesophageal reflux disease) 02/12/2011   Past Medical History:  Diagnosis Date  . Back pain    due to large breasts  . Fever blister   . GERD (gastroesophageal reflux disease)   . No pertinent past medical history   . Overweight(278.02)    Past Surgical History:  Procedure Laterality Date  . BREAST REDUCTION SURGERY  12/01/13  . BUNIONECTOMY  1988  . cataract surgery  2010  . CESAREAN SECTION    . MASS EXCISION  06/08/2011   Procedure: EXCISION MASS;  Surgeon: Cammie Sickle., MD;  Location: [facility];  Service: Orthopedics;  Laterality: Right;  excisional biopsy of exostosis right thumb  . TONSILLECTOMY     Social History  Substance Use Topics  .  Smoking status: Former Smoker    Quit date: 06/07/1975  . Smokeless tobacco: Never Used     Comment: quit 1976  . Alcohol use 0.0 oz/week     Comment: occasional wine/alcohol   Family History  Problem Relation Age of Onset  . Asthma Mother   . Cancer Father     brain tumor  . Heart disease Maternal Grandfather     MI   Allergies  Allergen Reactions  . Penicillins     REACTION: Pt said not sure if she was allergic  to penicillin. Pt said 11 + years ago was told allergic to penicillin. Not sure what type reaction.   Current Outpatient Prescriptions on File Prior to Visit  Medication Sig Dispense Refill  . Calcium Carbonate-Vitamin D (CALCIUM + D PO) Take 1 capsule by mouth 2 (two) times daily.    . Cholecalciferol (VITAMIN D) 1000 UNITS capsule Take 1,000 Units by mouth daily.      Marland Kitchen estradiol (ESTRACE) 0.1 MG/GM vaginal cream Use 1-2 cm in applicator intravaginally twice a week 42.5 g 3  . Multiple Vitamin (MULTIVITAMIN) tablet Take 1 tablet by mouth daily.       No current facility-administered medications on file prior to visit.       Review of Systems Review of Systems  Constitutional: Negative for fever, appetite change, fatigue and unexpected weight  change.  Eyes: Negative for pain and visual disturbance.  Respiratory: Negative for cough and shortness of breath.   Cardiovascular: Negative for cp or palpitations    Gastrointestinal: Negative for nausea, diarrhea and constipation.  Genitourinary: Negative for urgency and frequency.  Skin: Negative for pallor or rash  pos for insect bite on back  Neurological: Negative for weakness, light-headedness, numbness and headaches.  Hematological: Negative for adenopathy. Does not bruise/bleed easily.  Psychiatric/Behavioral: Negative for dysphoric mood. The patient is not nervous/anxious.         Objective:   Physical Exam  Constitutional: She appears well-developed and well-nourished. No distress.  Well appearing   Eyes:  Conjunctivae and EOM are normal. Pupils are equal, round, and reactive to light. Right eye exhibits no discharge. Left eye exhibits no discharge.  Cardiovascular: Normal rate, regular rhythm and normal heart sounds.   Pulmonary/Chest: Breath sounds normal. No respiratory distress. She has no wheezes.  Genitourinary:  Genitourinary Comments: Breast exam: No mass, nodules, thickening, tenderness, bulging, retraction, inflamation, nipple discharge or skin changes noted.  No axillary or clavicular LA.     Scars from past breast reduction noted -no changes   Neurological: She is alert.  Skin: Skin is warm and dry. No pallor.  Quarter sized whelp on R back consistent with insect bite No signs of infection   No active poison ivy rash today  Psychiatric: She has a normal mood and affect.          Assessment & Plan:   Problem List Items Addressed This Visit      Musculoskeletal and Integument   Contact dermatitis    Pt gets poison ivy dermatitis easily and keeps triamcinolone cream on hand in case of exposure Refilled today Enc to hire someone to keep it out of her yard  Also wear app clothes and bathe after outdoor work        Other   Atypical lobular hyperplasia of breast - Primary    Exam today-normal Enc monthly self exams mammo due in Sept F/u here in dec for next visit        Other Visit Diagnoses   None.

## 2016-03-20 ENCOUNTER — Other Ambulatory Visit: Payer: Self-pay | Admitting: Family Medicine

## 2016-03-20 DIAGNOSIS — Z1231 Encounter for screening mammogram for malignant neoplasm of breast: Secondary | ICD-10-CM

## 2016-03-20 DIAGNOSIS — Z9889 Other specified postprocedural states: Secondary | ICD-10-CM

## 2016-04-06 ENCOUNTER — Ambulatory Visit (INDEPENDENT_AMBULATORY_CARE_PROVIDER_SITE_OTHER): Payer: Medicare Other

## 2016-04-06 DIAGNOSIS — Z23 Encounter for immunization: Secondary | ICD-10-CM

## 2016-04-16 ENCOUNTER — Ambulatory Visit: Payer: Medicare Other

## 2016-04-24 ENCOUNTER — Ambulatory Visit: Payer: Medicare Other

## 2016-06-01 ENCOUNTER — Telehealth (INDEPENDENT_AMBULATORY_CARE_PROVIDER_SITE_OTHER): Payer: Medicare Other | Admitting: Family Medicine

## 2016-06-01 ENCOUNTER — Other Ambulatory Visit (INDEPENDENT_AMBULATORY_CARE_PROVIDER_SITE_OTHER): Payer: Medicare Other

## 2016-06-01 DIAGNOSIS — E785 Hyperlipidemia, unspecified: Secondary | ICD-10-CM

## 2016-06-01 DIAGNOSIS — Z0000 Encounter for general adult medical examination without abnormal findings: Secondary | ICD-10-CM | POA: Diagnosis not present

## 2016-06-01 DIAGNOSIS — Z131 Encounter for screening for diabetes mellitus: Secondary | ICD-10-CM

## 2016-06-01 LAB — LIPID PANEL
CHOL/HDL RATIO: 3
Cholesterol: 180 mg/dL (ref 0–200)
HDL: 70.1 mg/dL (ref >39.00)
LDL Cholesterol: 89 mg/dL (ref 0–99)
NONHDL: 109.95
Triglycerides: 107 mg/dL (ref 0.0–149.0)
VLDL: 21.4 mg/dL (ref 0.0–40.0)

## 2016-06-01 LAB — CBC WITH DIFFERENTIAL/PLATELET
BASOS PCT: 0.5 % (ref 0.0–3.0)
Basophils Absolute: 0 10*3/uL (ref 0.0–0.1)
EOS PCT: 1.7 % (ref 0.0–5.0)
Eosinophils Absolute: 0.1 10*3/uL (ref 0.0–0.7)
HCT: 40.5 % (ref 36.0–46.0)
Hemoglobin: 13.5 g/dL (ref 12.0–15.0)
LYMPHS ABS: 2.1 10*3/uL (ref 0.7–4.0)
Lymphocytes Relative: 27.7 % (ref 12.0–46.0)
MCHC: 33.4 g/dL (ref 30.0–36.0)
MCV: 91.9 fl (ref 78.0–100.0)
MONO ABS: 0.5 10*3/uL (ref 0.1–1.0)
Monocytes Relative: 6.8 % (ref 3.0–12.0)
NEUTROS PCT: 63.3 % (ref 43.0–77.0)
Neutro Abs: 4.8 10*3/uL (ref 1.4–7.7)
Platelets: 338 10*3/uL (ref 150.0–400.0)
RBC: 4.41 Mil/uL (ref 3.87–5.11)
RDW: 13.4 % (ref 11.5–15.5)
WBC: 7.5 10*3/uL (ref 4.0–10.5)

## 2016-06-01 LAB — COMPREHENSIVE METABOLIC PANEL
ALT: 14 U/L (ref 0–35)
AST: 14 U/L (ref 0–37)
Albumin: 4.3 g/dL (ref 3.5–5.2)
Alkaline Phosphatase: 62 U/L (ref 39–117)
BUN: 17 mg/dL (ref 6–23)
CHLORIDE: 107 meq/L (ref 96–112)
CO2: 30 meq/L (ref 19–32)
Calcium: 9.7 mg/dL (ref 8.4–10.5)
Creatinine, Ser: 0.8 mg/dL (ref 0.40–1.20)
GFR: 74.78 mL/min (ref >60.00)
GLUCOSE: 98 mg/dL (ref 70–99)
POTASSIUM: 4.5 meq/L (ref 3.5–5.1)
SODIUM: 144 meq/L (ref 135–145)
Total Bilirubin: 0.4 mg/dL (ref 0.2–1.2)
Total Protein: 7.2 g/dL (ref 6.0–8.3)

## 2016-06-01 LAB — TSH: TSH: 1.46 u[IU]/mL (ref 0.35–4.50)

## 2016-06-01 NOTE — Telephone Encounter (Signed)
-----   Message from Marchia Bond sent at 06/01/2016  8:18 AM EST ----- Regarding: Pt walked in for labs this am, need orders ASAP! Thanks :-) Pt walked in for cpx labs this am, need orders.  Thanks Aniceto Boss

## 2016-06-07 ENCOUNTER — Other Ambulatory Visit: Payer: Medicare Other

## 2016-06-12 ENCOUNTER — Encounter: Payer: Medicare Other | Admitting: Family Medicine

## 2016-07-06 ENCOUNTER — Ambulatory Visit (INDEPENDENT_AMBULATORY_CARE_PROVIDER_SITE_OTHER): Payer: Medicare Other | Admitting: Family Medicine

## 2016-07-06 ENCOUNTER — Encounter: Payer: Self-pay | Admitting: Family Medicine

## 2016-07-06 VITALS — BP 124/68 | HR 59 | Temp 97.7°F | Wt 142.2 lb

## 2016-07-06 DIAGNOSIS — E7800 Pure hypercholesterolemia, unspecified: Secondary | ICD-10-CM | POA: Diagnosis not present

## 2016-07-06 DIAGNOSIS — M8589 Other specified disorders of bone density and structure, multiple sites: Secondary | ICD-10-CM

## 2016-07-06 DIAGNOSIS — N952 Postmenopausal atrophic vaginitis: Secondary | ICD-10-CM

## 2016-07-06 DIAGNOSIS — E2839 Other primary ovarian failure: Secondary | ICD-10-CM

## 2016-07-06 DIAGNOSIS — N6099 Unspecified benign mammary dysplasia of unspecified breast: Secondary | ICD-10-CM

## 2016-07-06 DIAGNOSIS — Z0000 Encounter for general adult medical examination without abnormal findings: Secondary | ICD-10-CM

## 2016-07-06 MED ORDER — ESTRADIOL 0.1 MG/GM VA CREA: TOPICAL_CREAM | VAGINAL | 3 refills | Status: DC

## 2016-07-06 NOTE — Progress Notes (Signed)
Subjective:    Patient ID: Bailey Stephens, female    DOB: 09-Nov-1945, 71 y.o.   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  Eats healthy - except for the holidays  Too cold to walk (not this week)  Does some yoga -that is helpful  bmi 27.1  Mammogram 10/16-nl (it is scheduled on Monday)  Hx of atypical lobular hyperplasia and req breast exam twice yearly  Declined tamoxifen in the past  Self breast exam -no lumps or changes   colonoscopy 1/09-normal 10 year recall   Tetanus shot 2/11 Zoster vac 11/13 PNA vac completed Flu shot 10/17  dexa 1/16-osteopenia No falls in the past year  No fractures  Is taking ca and D  Exercises also  Wants to set this up for 2018  Pap 7/13- neg at gyn No longer goes  No gyn problems or complaints   Hx of hyperlipidemia Lab Results  Component Value Date   CHOL 180 06/01/2016   CHOL 170 06/03/2015   CHOL 183 06/02/2014   Lab Results  Component Value Date   HDL 70.10 06/01/2016   HDL 63.80 06/03/2015   HDL 59.70 06/02/2014   Lab Results  Component Value Date   LDLCALC 89 06/01/2016   LDLCALC 88 06/03/2015   LDLCALC 105 (H) 06/02/2014   Lab Results  Component Value Date   TRIG 107.0 06/01/2016   TRIG 89.0 06/03/2015   TRIG 94.0 06/02/2014   Lab Results  Component Value Date   CHOLHDL 3 06/01/2016   CHOLHDL 3 06/03/2015   CHOLHDL 3 06/02/2014   No results found for: LDLDIRECT  Doing great with cholesterol/ excellent HDL   Results for orders placed or performed in visit on 06/01/16  CBC with Differential/Platelet  Result Value Ref Range   WBC 7.5 4.0 - 10.5 K/uL   RBC 4.41 3.87 - 5.11 Mil/uL   Hemoglobin 13.5 12.0 - 15.0 g/dL   HCT 40.5 36.0 - 46.0 %   MCV  91.9 78.0 - 100.0 fl   MCHC 33.4 30.0 - 36.0 g/dL   RDW 13.4 11.5 - 15.5 %   Platelets 338.0 150.0 - 400.0 K/uL   Neutrophils Relative % 63.3 43.0 - 77.0 %   Lymphocytes Relative 27.7 12.0 - 46.0 %   Monocytes Relative 6.8 3.0 - 12.0 %   Eosinophils Relative 1.7 0.0 - 5.0 %   Basophils Relative 0.5 0.0 - 3.0 %   Neutro Abs 4.8 1.4 - 7.7 K/uL   Lymphs Abs 2.1 0.7 - 4.0 K/uL   Monocytes Absolute 0.5 0.1 - 1.0 K/uL   Eosinophils Absolute 0.1 0.0 - 0.7 K/uL   Basophils Absolute 0.0 0.0 - 0.1 K/uL  Comprehensive metabolic panel  Result Value Ref Range   Sodium 144 135 - 145 mEq/L   Potassium 4.5 3.5 - 5.1 mEq/L   Chloride 107 96 - 112 mEq/L   CO2 30 19 - 32 mEq/L   Glucose, Bld 98 70 - 99 mg/dL   BUN 17 6 - 23 mg/dL   Creatinine, Ser 0.80 0.40 - 1.20 mg/dL   Total Bilirubin 0.4 0.2 - 1.2 mg/dL   Alkaline  Phosphatase 62 39 - 117 U/L   AST 14 0 - 37 U/L   ALT 14 0 - 35 U/L   Total Protein 7.2 6.0 - 8.3 g/dL   Albumin 4.3 3.5 - 5.2 g/dL   Calcium 9.7 8.4 - 10.5 mg/dL   GFR 74.78 >60.00 mL/min  TSH  Result Value Ref Range   TSH 1.46 0.35 - 4.50 uIU/mL  Lipid panel  Result Value Ref Range   Cholesterol 180 0 - 200 mg/dL   Triglycerides 107.0 0.0 - 149.0 mg/dL   HDL 70.10 >39.00 mg/dL   VLDL 21.4 0.0 - 40.0 mg/dL   LDL Cholesterol 89 0 - 99 mg/dL   Total CHOL/HDL Ratio 3    NonHDL 109.95     Patient Active Problem List   Diagnosis Date Noted  . Contact dermatitis 02/29/2016  . Hyperlipidemia 06/02/2015  . Routine general medical examination at a health care facility 06/02/2015  . 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 Osteopenia 07/15/2014  . Estrogen deficiency 06/07/2014  . Abnormal breast biopsy 06/07/2014  . Encounter for Medicare annual wellness exam 05/24/2013  . 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 Screening for lipoid disorders 02/12/2011  . Diabetes mellitus screening 02/12/2011  . GERD (gastroesophageal reflux disease) 02/12/2011    Past Medical History:  Diagnosis Date  . Back pain    due to large breasts  . Fever blister   . GERD (gastroesophageal reflux disease)   . No pertinent past medical history   . Overweight(278.02)    Past Surgical History:  Procedure Laterality Date  . BREAST REDUCTION SURGERY  12/01/13  . BUNIONECTOMY  1988  . cataract surgery  2010  . CESAREAN SECTION    . MASS EXCISION  06/08/2011   Procedure: EXCISION MASS;  Surgeon: Cammie Sickle., MD;  Location: [facility];  Service: Orthopedics;  Laterality: Right;  excisional biopsy of exostosis right thumb  . TONSILLECTOMY     Social History  Substance Use Topics  . Smoking status: Former Smoker    Quit date: 06/07/1975  . Smokeless tobacco: Never Used     Comment: quit 1976  . Alcohol use 0.0 oz/week     Comment: occasional wine/alcohol   Family History  Problem Relation Age of Onset  . Asthma Mother   . Cancer Father     brain tumor  . Heart disease Maternal Grandfather     MI   Allergies  Allergen Reactions  . Penicillins     REACTION: Pt said not sure if she was allergic  to penicillin. Pt said 6 + years ago was told allergic to penicillin. Not sure what type reaction.   Current Outpatient Prescriptions on File Prior to Visit  Medication Sig Dispense Refill  . Calcium Carbonate-Vitamin D (CALCIUM + D PO) Take 1 capsule by mouth 2 (two) times daily.    . Cholecalciferol (VITAMIN D) 1000 UNITS capsule Take 1,000 Units by mouth daily.      . Multiple Vitamin (MULTIVITAMIN) tablet Take 1 tablet by mouth daily.      Marland Kitchen triamcinolone cream (KENALOG) 0.1 % APPLY TOPICALLYTO THE AFFECTED AREAS 2 (TWO) TIMES DAILY AS NEEDED. 30 g 3   No current facility-administered medications on file prior to visit.     Review of Systems    Review of Systems  Constitutional: Negative for fever, appetite change, fatigue and unexpected weight change.  Eyes: Negative for pain and visual disturbance.  Respiratory: Negative  for cough  and shortness of breath.   Cardiovascular: Negative for cp or palpitations    Gastrointestinal: Negative for nausea, diarrhea and constipation.  Genitourinary: Negative for urgency and frequency.  Skin: Negative for pallor or rash   Neurological: Negative for weakness, light-headedness, numbness and headaches.  Hematological: Negative for adenopathy. Does not bruise/bleed easily.  Psychiatric/Behavioral: Negative for dysphoric mood. The patient is not nervous/anxious.       Objective:   Physical Exam  Constitutional: She appears well-developed and well-nourished. No distress.  Well appearing   HENT:  Head: Normocephalic and atraumatic.  Right Ear: External ear normal.  Left Ear: External ear normal.  Mouth/Throat: Oropharynx is clear and moist.  Eyes: Conjunctivae and EOM are normal. Pupils are equal, round, and reactive to light. No scleral icterus.  Neck: Normal range of motion. Neck supple. No JVD present. Carotid bruit is not present. No thyromegaly present.  Cardiovascular: Normal rate, regular rhythm, normal heart sounds and intact distal pulses.  Exam reveals no gallop.   Pulmonary/Chest: Effort normal and breath sounds normal. No respiratory distress. She has no wheezes. She exhibits no tenderness.  Abdominal: Soft. Bowel sounds are normal. She exhibits no distension, no abdominal bruit and no mass. There is no tenderness.  Genitourinary: No breast swelling, tenderness, discharge or bleeding.  Genitourinary Comments: Breast exam: No mass, nodules, thickening, tenderness, bulging, retraction, inflamation, nipple discharge or skin changes noted.  No axillary or clavicular LA.      Musculoskeletal: Normal range of motion. She exhibits no edema or tenderness.  Lymphadenopathy:    She has no cervical adenopathy.  Neurological: She is alert. She has normal reflexes. No cranial nerve deficit. She exhibits normal muscle tone. Coordination normal.  Skin: Skin is warm and dry.  No rash noted. No erythema. No pallor.  Psychiatric: She has a normal mood and affect.          Assessment & Plan:   Problem List Items Addressed This Visit      Musculoskeletal and Integument   Osteopenia - Primary    Due for 2 y re check of dexa this mo Will refer for this No falls or fx Disc need for calcium/ vitamin D/ wt bearing exercise and bone density test every 2 y to monitor Disc safety/ fracture risk in detail          Genitourinary   Postmenopausal atrophic vaginitis    Refilled estrace cream-it is helpful No c/o        Other   Atypical lobular hyperplasia of breast    Nl exam today  F/u 6 mo for next exam  Mammogram planned for monday      Estrogen deficiency   Relevant Orders   DG Bone Density   Hyperlipidemia   Routine general medical examination at a health care facility    Reviewed health habits including diet and exercise and skin cancer prevention Reviewed appropriate screening tests for age  Also reviewed health mt list, fam hx and immunization status , as well as social and family history   See HPI Labs reviewed AMW visit scheduled  imms utd Enc further healthy diet and exercise Mammogram planned Monday-nl breast exam  Re for dexa

## 2016-07-06 NOTE — Progress Notes (Signed)
Pre visit review using our clinic review tool, if applicable. No additional management support is needed unless otherwise documented below in the visit note. 

## 2016-07-06 NOTE — Assessment & Plan Note (Signed)
Nl exam today  F/u 6 mo for next exam  Mammogram planned for monday

## 2016-07-06 NOTE — Assessment & Plan Note (Signed)
Reviewed health habits including diet and exercise and skin cancer prevention Reviewed appropriate screening tests for age  Also reviewed health mt list, fam hx and immunization status , as well as social and family history   See HPI Labs reviewed AMW visit scheduled  imms utd Enc further healthy diet and exercise Mammogram planned Monday-nl breast exam  Re for dexa

## 2016-07-06 NOTE — Patient Instructions (Addendum)
Get your mammogram as planned  Stop at check out for referral for dexa  Keep exercising  Take care of yourself  Labs look good

## 2016-07-06 NOTE — Assessment & Plan Note (Signed)
Refilled estrace cream-it is helpful No c/o

## 2016-07-06 NOTE — Assessment & Plan Note (Signed)
Due for 2 y re check of dexa this mo Will refer for this No falls or fx Disc need for calcium/ vitamin D/ wt bearing exercise and bone density test every 2 y to monitor Disc safety/ fracture risk in detail

## 2016-07-09 ENCOUNTER — Ambulatory Visit
Admission: RE | Admit: 2016-07-09 | Discharge: 2016-07-09 | Disposition: A | Discharge status: Home / Self Care | Payer: Medicare Other | Source: Ambulatory Visit | Attending: Family Medicine | Admitting: Family Medicine

## 2016-07-09 DIAGNOSIS — Z9889 Other specified postprocedural states: Secondary | ICD-10-CM

## 2016-07-09 DIAGNOSIS — Z1231 Encounter for screening mammogram for malignant neoplasm of breast: Secondary | ICD-10-CM

## 2016-07-10 ENCOUNTER — Other Ambulatory Visit: Payer: Self-pay | Admitting: Family Medicine

## 2016-07-10 DIAGNOSIS — R928 Other abnormal and inconclusive findings on diagnostic imaging of breast: Secondary | ICD-10-CM

## 2016-07-13 ENCOUNTER — Ambulatory Visit
Admission: RE | Admit: 2016-07-13 | Discharge: 2016-07-13 | Disposition: A | Discharge status: Home / Self Care | Payer: Medicare Other | Source: Ambulatory Visit | Attending: Family Medicine | Admitting: Family Medicine

## 2016-07-13 DIAGNOSIS — E2839 Other primary ovarian failure: Secondary | ICD-10-CM

## 2016-07-17 ENCOUNTER — Other Ambulatory Visit: Payer: Medicare Other

## 2016-07-19 ENCOUNTER — Ambulatory Visit: Payer: Medicare Other

## 2016-07-20 ENCOUNTER — Ambulatory Visit
Admission: RE | Admit: 2016-07-20 | Discharge: 2016-07-20 | Disposition: A | Discharge status: Home / Self Care | Payer: Medicare Other | Source: Ambulatory Visit | Attending: Family Medicine | Admitting: Family Medicine

## 2016-07-20 DIAGNOSIS — R928 Other abnormal and inconclusive findings on diagnostic imaging of breast: Secondary | ICD-10-CM

## 2016-07-23 ENCOUNTER — Ambulatory Visit (INDEPENDENT_AMBULATORY_CARE_PROVIDER_SITE_OTHER): Payer: Medicare Other

## 2016-07-23 VITALS — BP 112/80 | HR 68 | Temp 98.3°F | Ht 60.5 in | Wt 142.0 lb

## 2016-07-23 DIAGNOSIS — Z0000 Encounter for general adult medical examination without abnormal findings: Secondary | ICD-10-CM | POA: Diagnosis not present

## 2016-07-23 DIAGNOSIS — Z1159 Encounter for screening for other viral diseases: Secondary | ICD-10-CM | POA: Diagnosis not present

## 2016-07-23 NOTE — Progress Notes (Signed)
PCP notes:   Health maintenance:  Hep C screening - completed  Abnormal screenings:   None  Patient concerns:   None  Nurse concerns:  None  Next PCP appt:   01/07/17 @ 0900  I reviewed health advisor's note, was available for consultation, and agree with documentation and plan. Loura Pardon MD

## 2016-07-23 NOTE — Patient Instructions (Signed)
Bailey Stephens , Thank you for taking time to come for your Medicare Wellness Visit. I appreciate your ongoing commitment to your health goals. Please review the following plan we discussed and let me know if I can assist you in the future.   These are the goals we discussed: Goals    . Weight (lb) < 131 lb (59.4 kg)          Target weight is 130 lbs. Starting 07/23/16, I will make healthier choices for breakfast.        This is a list of the screening recommended for you and due dates:  Health Maintenance  Topic Date Due  . Colon Cancer Screening  07/02/2017  . Mammogram  07/09/2017  . Tetanus Vaccine  08/12/2019  . Flu Shot  Completed  . DEXA scan (bone density measurement)  Completed  . Shingles Vaccine  Completed  .  Hepatitis C: One time screening is recommended by Center for Disease Control  (CDC) for  adults born from [birth year] through 1967.   Completed  . Pneumonia vaccines  Completed   Preventive Care for Adults  A healthy lifestyle and preventive care can promote health and wellness. Preventive health guidelines for adults include the following key practices.  . A routine yearly physical is a good way to check with your health care provider about your health and preventive screening. It is a chance to share any concerns and updates on your health and to receive a thorough exam.  . Visit your dentist for a routine exam and preventive care every 6 months. Brush your teeth twice a day and floss once a day. Good oral hygiene prevents tooth decay and gum disease.  . The frequency of eye exams is based on your age, health, family medical history, use  of contact lenses, and other factors. Follow your health care provider's ecommendations for frequency of eye exams.  . Eat a healthy diet. Foods like vegetables, fruits, whole grains, low-fat dairy products, and lean protein foods contain the nutrients you need without too many calories. Decrease your intake of foods high in solid fats,  added sugars, and salt. Eat the right amount of calories for you. Get information about a proper diet from your health care provider, if necessary.  . Regular physical exercise is one of the most important things you can do for your health. Most adults should get at least 150 minutes of moderate-intensity exercise (any activity that increases your heart rate and causes you to sweat) each week. In addition, most adults need muscle-strengthening exercises on 2 or more days a week.  Silver Sneakers may be a benefit available to you. To determine eligibility, you may visit the website: www.silversneakers.com or contact program at [phone] Mon-Fri between 8AM-8PM.   . Maintain a healthy weight. The body mass index (BMI) is a screening tool to identify possible weight problems. It provides an estimate of body fat based on height and weight. Your health care provider can find your BMI and can help you achieve or maintain a healthy weight.   For adults 20 years and older: ? A BMI below 18.5 is considered underweight. ? A BMI of 18.5 to 24.9 is normal. ? A BMI of 25 to 29.9 is considered overweight. ? A BMI of 30 and above is considered obese.   . Maintain normal blood lipids and cholesterol levels by exercising and minimizing your intake of saturated fat. Eat a balanced diet with plenty of fruit and vegetables. Blood tests  for lipids and cholesterol should begin at age 27 and be repeated every 5 years. If your lipid or cholesterol levels are high, you are over 50, or you are at high risk for heart disease, you may need your cholesterol levels checked more frequently. Ongoing high lipid and cholesterol levels should be treated with medicines if diet and exercise are not working.  . If you smoke, find out from your health care provider how to quit. If you do not use tobacco, please do not start.  . If you choose to drink alcohol, please do not consume more than 2 drinks per day. One drink is  considered to be 12 ounces (355 mL) of beer, 5 ounces (148 mL) of wine, or 1.5 ounces (44 mL) of liquor.  . If you are 28-39 years old, ask your health care provider if you should take aspirin to prevent strokes.  . Use sunscreen. Apply sunscreen liberally and repeatedly throughout the day. You should seek shade when your shadow is shorter than you. Protect yourself by wearing long sleeves, pants, a wide-brimmed hat, and sunglasses year round, whenever you are outdoors.  . Once a month, do a whole body skin exam, using a mirror to look at the skin on your back. Tell your health care provider of new moles, moles that have irregular borders, moles that are larger than a pencil eraser, or moles that have changed in shape or color.

## 2016-07-23 NOTE — Progress Notes (Signed)
Pre visit review using our clinic review tool, if applicable. No additional management support is needed unless otherwise documented below in the visit note. 

## 2016-07-23 NOTE — Progress Notes (Signed)
Subjective:   Bailey Stephens is a 71 y.o. female who presents for Medicare Annual (Subsequent) preventive examination.  Review of Systems:  N/A Cardiac Risk Factors include: advanced age (>21men, >40 women);dyslipidemia     Objective:     Vitals: BP 112/80 (BP Location: Right Arm, Patient Position: Sitting, Cuff Size: Normal)   Pulse 68   Temp 98.3 F (36.8 C) (Oral)   Ht 5' 0.5" (1.537 m) Comment: no shoes  Wt 142 lb (64.4 kg)   SpO2 96%   BMI 27.28 kg/m   Body mass index is 27.28 kg/m.   Tobacco History  Smoking Status  . Former Smoker  . Quit date: 06/07/1975  Smokeless Tobacco  . Never Used    Comment: quit 1976     Counseling given: No   Past Medical History:  Diagnosis Date  . Back pain    due to large breasts  . Fever blister   . GERD (gastroesophageal reflux disease)   . No pertinent past medical history   . Overweight(278.02)    Past Surgical History:  Procedure Laterality Date  . BREAST REDUCTION SURGERY  12/01/13  . BUNIONECTOMY  1988  . cataract surgery  2010  . CESAREAN SECTION    . MASS EXCISION  06/08/2011   Procedure: EXCISION MASS;  Surgeon: Cammie Sickle., MD;  Location: [facility];  Service: Orthopedics;  Laterality: Right;  excisional biopsy of exostosis right thumb  . TONSILLECTOMY     Family History  Problem Relation Age of Onset  . Asthma Mother   . Cancer Father     brain tumor  . Heart disease Maternal Grandfather     MI   History  Sexual Activity  . Sexual activity: Yes    Outpatient Encounter Prescriptions as of 07/23/2016  Medication Sig  . Calcium Carbonate-Vitamin D (CALCIUM + D PO) Take 1 capsule by mouth 2 (two) times daily.  . Cholecalciferol (VITAMIN D) 1000 UNITS capsule Take 1,000 Units by mouth daily.    Marland Kitchen estradiol (ESTRACE) 0.1 MG/GM vaginal cream Use 1-2 cm in applicator intravaginally twice a week  . Multiple Vitamin (MULTIVITAMIN) tablet Take 1 tablet by mouth daily.    Marland Kitchen  triamcinolone cream (KENALOG) 0.1 % APPLY TOPICALLYTO THE AFFECTED AREAS 2 (TWO) TIMES DAILY AS NEEDED.   No facility-administered encounter medications on file as of 07/23/2016.     Activities of Daily Living In your present state of health, do you have any difficulty performing the following activities: 07/23/2016  Hearing? N  Vision? N  Difficulty concentrating or making decisions? N  Walking or climbing stairs? N  Dressing or bathing? N  Doing errands, shopping? N  Preparing Food and eating ? N  Using the Toilet? N  In the past six months, have you accidently leaked urine? N  Do you have problems with loss of bowel control? N  Managing your Medications? N  Managing your Finances? N  Housekeeping or managing your Housekeeping? N  Some recent data might be hidden    Patient Care Team: Abner Greenspan, MD as PCP - General    Assessment:     Hearing Screening   125Hz  250Hz  500Hz  1000Hz  2000Hz  3000Hz  4000Hz  6000Hz  8000Hz   Right ear:   40 40 40  40    Left ear:   40 40 40  40    Vision Screening Comments: Last vision exam in Oct 2017 with Dr. Kathrin Penner   Exercise Activities and Dietary  recommendations Exercise limited by: None identified  Goals    . Weight (lb) < 131 lb (59.4 kg)          Target weight is 130 lbs. Starting 07/23/16, I will make healthier choices for breakfast.       Fall Risk Fall Risk  07/23/2016 07/06/2016 06/10/2015 06/07/2014 06/05/2013  Falls in the past year? No No No No No   Depression Screen PHQ 2/9 Scores 07/23/2016 07/06/2016 06/10/2015 06/07/2014  PHQ - 2 Score 0 0 0 0     Cognitive Function MMSE - Mini Mental State Exam 07/23/2016  Orientation to time 5  Orientation to Place 5  Registration 3  Attention/ Calculation 0  Recall 3  Language- name 2 objects 0  Language- repeat 1  Language- follow 3 step command 3  Language- read & follow direction 0  Write a sentence 0  Copy design 0  Total score 20       PLEASE NOTE: A Mini-Cog screen was  completed. Maximum score is 20. A value of 0 denotes this part of Folstein MMSE was not completed or the patient failed this part of the Mini-Cog screening.   Mini-Cog Screening Orientation to Time - Max 5 pts Orientation to Place - Max 5 pts Registration - Max 3 pts Recall - Max 3 pts Language Repeat - Max 1 pts Language Follow 3 Step Command - Max 3 pts   Immunization History  Administered Date(s) Administered  . Influenza Split 05/01/2011  . Influenza Whole 05/10/2010  . Influenza,inj,Quad PF,36+ Mos 06/05/2013, 04/23/2014, 06/03/2015, 04/06/2016  . Pneumococcal Conjugate-13 06/07/2014  . Pneumococcal Polysaccharide-23 08/11/2009  . Td 08/11/2009  . Zoster 05/28/2012   Screening Tests Health Maintenance  Topic Date Due  . COLONOSCOPY  07/02/2017  . MAMMOGRAM  07/09/2017  . TETANUS/TDAP  08/12/2019  . INFLUENZA VACCINE  Completed  . DEXA SCAN  Completed  . ZOSTAVAX  Completed  . Hepatitis C Screening  Completed  . PNA vac Low Risk Adult  Completed      Plan:     I have personally reviewed and addressed the Medicare Annual Wellness questionnaire and have noted the following in the patient's chart:  A. Medical and social history B. Use of alcohol, tobacco or illicit drugs  C. Current medications and supplements D. Functional ability and status E.  Nutritional status F.  Physical activity G. Advance directives H. List of other physicians I.  Hospitalizations, surgeries, and ER visits in previous 12 months J.  [place] to include hearing, vision, cognitive, depression L. Referrals and appointments - none  In addition, I have reviewed and discussed with patient certain preventive protocols, quality metrics, and best practice recommendations. A written personalized care plan for preventive services as well as general preventive health recommendations were provided to patient.  See attached scanned questionnaire for additional information.   Signed,    Lindell Noe, MHA, BS, LPN Health Coach

## 2016-07-24 LAB — HEPATITIS C ANTIBODY: HCV Ab: NEGATIVE

## 2016-09-08 ENCOUNTER — Emergency Department (HOSPITAL_COMMUNITY): Payer: Medicare Other

## 2016-09-08 ENCOUNTER — Emergency Department (HOSPITAL_COMMUNITY)
Admission: EM | Admit: 2016-09-08 | Discharge: 2016-09-08 | Disposition: A | Discharge status: Home / Self Care | Payer: Medicare Other | Attending: Emergency Medicine | Admitting: Emergency Medicine

## 2016-09-08 ENCOUNTER — Encounter (HOSPITAL_COMMUNITY): Payer: Self-pay | Admitting: Emergency Medicine

## 2016-09-08 DIAGNOSIS — Z87891 Personal history of nicotine dependence: Secondary | ICD-10-CM | POA: Diagnosis not present

## 2016-09-08 DIAGNOSIS — S0101XA Laceration without foreign body of scalp, initial encounter: Secondary | ICD-10-CM

## 2016-09-08 DIAGNOSIS — Y929 Unspecified place or not applicable: Secondary | ICD-10-CM | POA: Diagnosis not present

## 2016-09-08 DIAGNOSIS — W228XXA Striking against or struck by other objects, initial encounter: Secondary | ICD-10-CM | POA: Insufficient documentation

## 2016-09-08 DIAGNOSIS — R51 Headache: Secondary | ICD-10-CM | POA: Insufficient documentation

## 2016-09-08 DIAGNOSIS — Y939 Activity, unspecified: Secondary | ICD-10-CM | POA: Insufficient documentation

## 2016-09-08 DIAGNOSIS — S0990XA Unspecified injury of head, initial encounter: Secondary | ICD-10-CM

## 2016-09-08 DIAGNOSIS — Y999 Unspecified external cause status: Secondary | ICD-10-CM | POA: Diagnosis not present

## 2016-09-08 MED ORDER — LIDOCAINE-EPINEPHRINE (PF) 1 %-1:200000 IJ SOLN
10.0000 mL | Freq: Once | INTRAMUSCULAR | Status: AC
  Administered 2016-09-08: 10 mL
  Filled 2016-09-08: qty 10

## 2016-09-08 NOTE — ED Notes (Signed)
Pt to ct 

## 2016-09-08 NOTE — ED Provider Notes (Signed)
[facility] DEPT Provider Note   CSN: [id number] Arrival date & time: 09/08/16  1417     History   Chief Complaint Chief Complaint  Patient presents with  . Fall  . Head Injury    HPI [name] is a 71 y.o. female. CC: Fall  HPI:  Patient states she was knocking on a door. The person opened the door courtroom she stepped backwards. When she did she lost her balance. She backpedal down 3 steps and then upon landing on the sidewalk fell backwards onto her buttocks, then struck her head. She has a 2/10 headache. No loss of consciousness. No amnesia for the event. His other blood thinners. Has occipital scalp laceration.  Past Medical History:  Diagnosis Date  . Back pain    due to large breasts  . Fever blister   . GERD (gastroesophageal reflux disease)   . No pertinent past medical history   . Overweight(278.02)     Patient Active Problem List   Diagnosis Date Noted  . Contact dermatitis 02/29/2016  . Hyperlipidemia 06/02/2015  . Routine general medical examination at a health care facility 06/02/2015  . 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 Osteopenia 07/15/2014  . Estrogen deficiency 06/07/2014  . Abnormal breast biopsy 06/07/2014  . Encounter for Medicare annual wellness exam 05/24/2013  . 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 Screening for lipoid disorders 02/12/2011  . Diabetes mellitus screening 02/12/2011  . GERD (gastroesophageal reflux disease) 02/12/2011    Past Surgical History:  Procedure Laterality Date  . BREAST REDUCTION SURGERY  12/01/13  . BUNIONECTOMY  1988  . cataract surgery  2010  . CESAREAN SECTION    . MASS EXCISION  06/08/2011   Procedure: EXCISION MASS;  Surgeon: Cammie Sickle., MD;  Location: [facility];  Service: Orthopedics;  Laterality: Right;  excisional biopsy of exostosis right thumb  . TONSILLECTOMY      OB History    No data available       Home Medications    Prior  to Admission medications   Medication Sig Start Date End Date Taking? Authorizing Provider  Calcium Carbonate-Vitamin D (CALCIUM + D PO) Take 1 capsule by mouth 2 (two) times daily.    Historical Provider, MD  Cholecalciferol (VITAMIN D) 1000 UNITS capsule Take 1,000 Units by mouth daily.      Historical Provider, MD  estradiol (ESTRACE) 0.1 MG/GM vaginal cream Use 1-2 cm in applicator intravaginally twice a week 07/06/16 07/06/19  Abner Greenspan, MD  Multiple Vitamin (MULTIVITAMIN) tablet Take 1 tablet by mouth daily.      Historical Provider, MD  triamcinolone cream (KENALOG) 0.1 % APPLY TOPICALLYTO THE AFFECTED AREAS 2 (TWO) TIMES DAILY AS NEEDED. 02/29/16   Abner Greenspan, MD    Family History Family History  Problem Relation Age of Onset  . Asthma Mother   . Cancer Father     brain tumor  . Heart disease Maternal Grandfather     MI    Social History Social History  Substance Use Topics  . Smoking status: Former Smoker    Quit date: 06/07/1975  . Smokeless tobacco: Never Used     Comment: quit 1976  . Alcohol use 0.0 oz/week     Comment: occasional wine/alcohol     Allergies   Penicillins   Review of Systems Review of Systems  Constitutional: Negative for appetite change, chills, diaphoresis, fatigue and fever.  HENT: Negative for mouth sores, sore  throat and trouble swallowing.   Eyes: Negative for visual disturbance.  Respiratory: Negative for cough, chest tightness, shortness of breath and wheezing.   Cardiovascular: Negative for chest pain.  Gastrointestinal: Negative for abdominal distention, abdominal pain, diarrhea, nausea and vomiting.  Endocrine: Negative for polydipsia, polyphagia and polyuria.  Genitourinary: Negative for dysuria, frequency and hematuria.  Musculoskeletal: Negative for gait problem.  Skin: Positive for wound. Negative for color change, pallor and rash.  Neurological: Positive for headaches. Negative for dizziness, syncope and light-headedness.    Hematological: Does not bruise/bleed easily.  Psychiatric/Behavioral: Negative for behavioral problems and confusion.     Physical Exam Updated Vital Signs BP 159/81   Pulse 73   Temp 97.7 F (36.5 C) (Oral)   Resp 16   Ht 5' 1.5" (1.562 m)   Wt 140 lb (63.5 kg)   SpO2 98%   BMI 26.02 kg/m   Physical Exam  Constitutional: She is oriented to person, place, and time. She appears well-developed and well-nourished. No distress.  HENT:  Head: Normocephalic.    Eyes: Conjunctivae are normal. Pupils are equal, round, and reactive to light. No scleral icterus.  Neck: Normal range of motion. Neck supple. No thyromegaly present.  Cardiovascular: Normal rate and regular rhythm.  Exam reveals no gallop and no friction rub.   No murmur heard. Pulmonary/Chest: Effort normal and breath sounds normal. No respiratory distress. She has no wheezes. She has no rales.  Abdominal: Soft. Bowel sounds are normal. She exhibits no distension. There is no tenderness. There is no rebound.  Musculoskeletal: Normal range of motion.  Neurological: She is alert and oriented to person, place, and time.  Skin: Skin is warm and dry. No rash noted.  Psychiatric: She has a normal mood and affect. Her behavior is normal.     ED Treatments / Results  Labs (all labs ordered are listed, but only abnormal results are displayed) Labs Reviewed - No data to display  EKG  EKG Interpretation None       Radiology Ct Head Wo Contrast  Result Date: 09/08/2016 CLINICAL DATA:  Fall.  Occipital head injury with laceration. EXAM: CT HEAD WITHOUT CONTRAST TECHNIQUE: Contiguous axial images were obtained from the base of the skull through the vertex without intravenous contrast. COMPARISON:  None. FINDINGS: Brain: No evidence of parenchymal hemorrhage or extra-axial fluid collection. No mass lesion, mass effect, or midline shift. No CT evidence of acute infarction. Cerebral volume is age appropriate. No  ventriculomegaly. Vascular: No hyperdense vessel or unexpected calcification. Skull: No evidence of calvarial fracture. Sinuses/Orbits: Mild mucoperiosteal thickening in the visualized inferior left maxillary sinus. No fluid levels in the visualized paranasal sinuses. Other: Prominent right parieto-occipital scalp hematoma. The mastoid air cells are unopacified. IMPRESSION: 1. Prominent right parieto-occipital scalp hematoma. No evidence of calvarial fracture. 2.  No evidence of acute intracranial abnormality. Electronically Signed   By: Ilona Sorrel M.D.   On: 09/08/2016 18:29    Procedures Procedures (including critical care time)  Medications Ordered in ED Medications  lidocaine-EPINEPHrine (XYLOCAINE-EPINEPHrine) 1 %-1:200000 (PF) injection 10 mL (10 mLs Infiltration Given by Other 09/08/16 1850)     Initial Impression / Assessment and Plan / ED Course  I have reviewed the triage vital signs and the nursing notes.  Pertinent labs & imaging results that were available during my care of the patient were reviewed by me and considered in my medical decision making (see chart for details).    Normal CT of the occipital hematoma.  LACERATION REPAIR Performed by: Lolita Patella Authorized by: Lolita Patella Consent: Verbal consent obtained. Risks and benefits: risks, benefits and alternatives were discussed Consent given by: patient Patient identity confirmed: provided demographic data Prepped and Draped in normal sterile fashion Wound explored  Laceration Location: Occipital acalp  Laceration Length: 2cm  No Foreign Bodies seen or palpated  Anesthesia: local infiltration  Local anesthetic: lidocaine 1% c epinephrine  Anesthetic total: 2 ml  Irrigation method: syringe Amount of cleaning: standard  Skin closure: Staples x 4   Number of sutures: 4 staples  Technique: staples  Patient tolerance: Patient tolerated the procedure well with no immediate  complications.   Final Clinical Impressions(s) / ED Diagnoses   Final diagnoses:  Laceration of scalp, initial encounter  Minor head injury, initial encounter    New Prescriptions Discharge Medication List as of 09/08/2016  7:01 PM       Tanna Furry, MD 09/08/16 2303

## 2016-09-08 NOTE — Discharge Instructions (Signed)
Staple removal in 7-10 days with your primary care physician.

## 2016-09-08 NOTE — ED Triage Notes (Signed)
Pt. Stated, I was at a door and the door opened and I stepped back and fell , hit my butt and then my head, laceration to back of head, bleeding controlled. No LOC

## 2016-09-17 ENCOUNTER — Ambulatory Visit (INDEPENDENT_AMBULATORY_CARE_PROVIDER_SITE_OTHER): Payer: Medicare Other | Admitting: Primary Care

## 2016-09-17 ENCOUNTER — Encounter: Payer: Self-pay | Admitting: Primary Care

## 2016-09-17 VITALS — BP 120/74 | HR 65 | Temp 98.7°F | Ht 60.5 in | Wt 144.0 lb

## 2016-09-17 DIAGNOSIS — Z4802 Encounter for removal of sutures: Secondary | ICD-10-CM | POA: Diagnosis not present

## 2016-09-17 NOTE — Progress Notes (Signed)
Pre visit review using our clinic review tool, if applicable. No additional management support is needed unless otherwise documented below in the visit note. 

## 2016-09-17 NOTE — Progress Notes (Signed)
Subjective:    Patient ID: Bailey Stephens, female    DOB: 11-14-1945, 71 y.o.   MRN: [id number]  HPI  Bailey Stephens is a 71 year old female who presents today for emergency department follow up. She presented to [facility] on 09/08/16 with a chief complaint of fall with head injury. She fell backwards after losing her balance when opening a door. She fell onto her buttocks and hit her head. No LOC.   Examination in the ED showed an occipital laceration. She underwent placement of four staples. CT head was unremarkable. She was discharged home with recommendations for PCP follow up and staple removal.  Since her discharge home she denies headaches, dizziness, changes in vision, bleeding from the stapled site.  Review of Systems  Constitutional: Negative for fever.  Eyes: Negative for visual disturbance.  Neurological: Negative for dizziness and headaches.       Past Medical History:  Diagnosis Date  . Back pain    due to large breasts  . Fever blister   . GERD (gastroesophageal reflux disease)   . No pertinent past medical history   . Overweight(278.02)      Social History   Social History  . Marital status: Married    Spouse name: N/A  . Number of children: N/A  . Years of education: N/A   Occupational History  . Not on file.   Social History Main Topics  . Smoking status: Former Smoker    Quit date: 06/07/1975  . Smokeless tobacco: Never Used     Comment: quit 1976  . Alcohol use 0.0 oz/week     Comment: occasional wine/alcohol  . Drug use: No  . Sexual activity: Yes   Other Topics Concern  . Not on file   Social History Narrative  . No narrative on file    Past Surgical History:  Procedure Laterality Date  . BREAST REDUCTION SURGERY  12/01/13  . BUNIONECTOMY  1988  . cataract surgery  2010  . CESAREAN SECTION    . MASS EXCISION  06/08/2011   Procedure: EXCISION MASS;  Surgeon: Cammie Sickle., MD;  Location: [facility];  Service:  Orthopedics;  Laterality: Right;  excisional biopsy of exostosis right thumb  . TONSILLECTOMY      Family History  Problem Relation Age of Onset  . Asthma Mother   . Cancer Father     brain tumor  . Heart disease Maternal Grandfather     MI    Allergies  Allergen Reactions  . Penicillins     REACTION: Pt said not sure if she was allergic  to penicillin. Pt said 72 + years ago was told allergic to penicillin. Not sure what type reaction.    Current Outpatient Prescriptions on File Prior to Visit  Medication Sig Dispense Refill  . Calcium Carbonate-Vitamin D (CALCIUM + D PO) Take 1 capsule by mouth 2 (two) times daily.    . Cholecalciferol (VITAMIN D) 1000 UNITS capsule Take 1,000 Units by mouth daily.      Marland Kitchen estradiol (ESTRACE) 0.1 MG/GM vaginal cream Use 1-2 cm in applicator intravaginally twice a week 42.5 g 3  . Multiple Vitamin (MULTIVITAMIN) tablet Take 1 tablet by mouth daily.      Marland Kitchen triamcinolone cream (KENALOG) 0.1 % APPLY TOPICALLYTO THE AFFECTED AREAS 2 (TWO) TIMES DAILY AS NEEDED. 30 g 3   No current facility-administered medications on file prior to visit.     BP 120/74  Pulse 65   Temp 98.7 F (37.1 C) (Oral)   Ht 5' 0.5" (1.537 m)   Wt 144 lb (65.3 kg)   SpO2 98%   BMI 27.66 kg/m    Objective:   Physical Exam  Constitutional: She appears well-nourished.  Cardiovascular: Normal rate.   Pulmonary/Chest: Effort normal.  Skin: Skin is warm and dry.  Scabbing with dried blood to mid upper occipital lobe with 5 staples present. No s/s of infection.          Assessment & Plan:  Staple Removal:  Laceration to occipital lobe on 09/08/16. 5 staples placed to laceration. CT scan from ED visit unremarkable. Site today appears to have healed well. 5 staples removed from laceration site. Dressing applied. Discussed home care instructions. Follow up PRN.  Sheral Flow, NP

## 2016-09-17 NOTE — Patient Instructions (Signed)
I removed 5 staples from your head today.  Soak in the bathtub to remove the dried blood and debris.  It was a pleasure to see you today!

## 2016-09-27 ENCOUNTER — Encounter: Payer: Self-pay | Admitting: Primary Care

## 2016-09-27 ENCOUNTER — Ambulatory Visit (INDEPENDENT_AMBULATORY_CARE_PROVIDER_SITE_OTHER): Payer: Medicare Other | Admitting: Primary Care

## 2016-09-27 VITALS — BP 112/72 | HR 72 | Temp 97.8°F

## 2016-09-27 DIAGNOSIS — Z4802 Encounter for removal of sutures: Secondary | ICD-10-CM

## 2016-09-27 NOTE — Patient Instructions (Signed)
Please notify us if you find another staple as the scab slowly improved.  It was a pleasure to see you today!

## 2016-09-27 NOTE — Progress Notes (Signed)
Pre visit review using our clinic review tool, if applicable. No additional management support is needed unless otherwise documented below in the visit note. 

## 2016-09-27 NOTE — Progress Notes (Signed)
Subjective:    Patient ID: Bailey Stephens, female    DOB: [birth year], 71 y.o.   MRN: [id number]  HPI  Bailey Stephens is a 71 year old female who presents today for staple removal. She presented last week for staple removal for which six staples were removed. Based off of the documentation in the emergency department, five staples were placed. This morning she noticed another remaining staple. She denies pain, erythema, drainage, tenderness.  Review of Systems  Constitutional: Negative for fever.  Skin: Negative for color change.       Scabbing.       Past Medical History:  Diagnosis Date  . Back pain    due to large breasts  . Fever blister   . GERD (gastroesophageal reflux disease)   . No pertinent past medical history   . Overweight(278.02)      Social History   Social History  . Marital status: Married    Spouse name: N/A  . Number of children: N/A  . Years of education: N/A   Occupational History  . Not on file.   Social History Main Topics  . Smoking status: Former Smoker    Quit date: 06/07/1975  . Smokeless tobacco: Never Used     Comment: quit 1976  . Alcohol use 0.0 oz/week     Comment: occasional wine/alcohol  . Drug use: No  . Sexual activity: Yes   Other Topics Concern  . Not on file   Social History Narrative  . No narrative on file    Past Surgical History:  Procedure Laterality Date  . BREAST REDUCTION SURGERY  12/01/13  . BUNIONECTOMY  1988  . cataract surgery  2010  . CESAREAN SECTION    . MASS EXCISION  06/08/2011   Procedure: EXCISION MASS;  Surgeon: Bailey Sickle., MD;  Location: [facility];  Service: Orthopedics;  Laterality: Right;  excisional biopsy of exostosis right thumb  . TONSILLECTOMY      Family History  Problem Relation Age of Onset  . Asthma Mother   . Cancer Father     brain tumor  . Heart disease Maternal Grandfather     MI    Allergies  Allergen Reactions  . Penicillins     REACTION: Pt  said not sure if she was allergic  to penicillin. Pt said 30 + years ago was told allergic to penicillin. Not sure what type reaction.    Current Outpatient Prescriptions on File Prior to Visit  Medication Sig Dispense Refill  . Calcium Carbonate-Vitamin D (CALCIUM + D PO) Take 1 capsule by mouth 2 (two) times daily.    . Cholecalciferol (VITAMIN D) 1000 UNITS capsule Take 1,000 Units by mouth daily.      Marland Kitchen estradiol (ESTRACE) 0.1 MG/GM vaginal cream Use 1-2 cm in applicator intravaginally twice a week 42.5 g 3  . Multiple Vitamin (MULTIVITAMIN) tablet Take 1 tablet by mouth daily.      Marland Kitchen triamcinolone cream (KENALOG) 0.1 % APPLY TOPICALLYTO THE AFFECTED AREAS 2 (TWO) TIMES DAILY AS NEEDED. 30 g 3   No current facility-administered medications on file prior to visit.     BP 112/72   Pulse 72   Temp 97.8 F (36.6 C) (Oral)   SpO2 97%    Objective:   Physical Exam  HENT:  Residual black scabbing to posterior parietal head. 1 remaining staple removed, no other staples noted.  Skin: Skin is warm and dry.  No s/s  of acute infection. Black scabbing.          Assessment & Plan:  Staple Removal:  Second attempt for removal. Patient found remaining staple which I suspect she discovered after washing away some of the scabbing.  No s/s of infection. Area cleansed, 1 staple removed.  Patient tolerated well. Area cleansed again.  Sheral Flow, NP

## 2017-01-07 ENCOUNTER — Ambulatory Visit: Payer: Medicare Other | Admitting: Family Medicine

## 2017-01-28 ENCOUNTER — Ambulatory Visit (INDEPENDENT_AMBULATORY_CARE_PROVIDER_SITE_OTHER): Payer: Medicare Other | Admitting: Family Medicine

## 2017-01-28 ENCOUNTER — Encounter: Payer: Self-pay | Admitting: Family Medicine

## 2017-01-28 VITALS — BP 140/76 | HR 54 | Temp 97.5°F | Ht 60.5 in | Wt 139.8 lb

## 2017-01-28 DIAGNOSIS — M8589 Other specified disorders of bone density and structure, multiple sites: Secondary | ICD-10-CM

## 2017-01-28 DIAGNOSIS — L237 Allergic contact dermatitis due to plants, except food: Secondary | ICD-10-CM

## 2017-01-28 DIAGNOSIS — N6099 Unspecified benign mammary dysplasia of unspecified breast: Secondary | ICD-10-CM | POA: Diagnosis not present

## 2017-01-28 MED ORDER — TRIAMCINOLONE ACETONIDE 0.1 % EX CREA: TOPICAL_CREAM | CUTANEOUS | 3 refills | Status: DC

## 2017-01-28 NOTE — Assessment & Plan Note (Signed)
Refilled triamcinolone to have on hand for poison ivy

## 2017-01-28 NOTE — Assessment & Plan Note (Signed)
Reviewed dexa 1/18  No fractures  On ca and D  Disc need for calcium/ vitamin D/ wt bearing exercise and bone density test every 2 y to monitor Disc safety/ fracture risk in detail   She is walking diligently for exercise

## 2017-01-28 NOTE — Progress Notes (Signed)
Subjective:    Patient ID: Bailey Stephens, female    DOB: 05-19-1946, 71 y.o.   MRN: [id number]  HPI Here for 6 mo f/u of chronic health problems with breast exam  Doing fine   Wt Readings from Last 3 Encounters:  01/28/17 139 lb 12 oz (63.4 kg)  09/17/16 144 lb (65.3 kg)  09/08/16 140 lb (63.5 kg)   26.84 kg/m   Hx of atypical lobular hyperplasia of breast-found with path from breast reduction  Also fam hx breast cancer Pt declines tamoxifen  Plan is breast exam with doctor q 6 mo and annual mammogram   No lumps on self exam  No pain or other symptoms   Mammogram done 1/18 Study Result   CLINICAL DATA:  Recall from screening mammography with tomosynthesis, possible focal asymmetries in the left breast visualized only on the MLO view.  EXAM: 2D DIGITAL DIAGNOSTIC UNILATERAL LEFT MAMMOGRAM WITH CAD AND ADJUNCT TOMO  COMPARISON:  07/09/2016, 04/22/2015 and earlier.  ACR Breast Density Category c: The breast tissue is heterogeneously dense, which may obscure small masses.  FINDINGS: Standard and tomosynthesis spot-compression MLO views of the areas of concern on screening mammography and a standard 2D and tomosynthesis full field mediolateral view of the left breast were obtained.  No persistent mass or architectural distortion in the areas of concern on screening mammography. These areas represent focally dense fibroglandular tissue. No findings suspicious for malignancy in the left breast.  The full field mediolateral image was processed with CAD.  IMPRESSION: No mammographic evidence of malignancy, left breast. The areas of concern on screening mammography represent focally dense fibroglandular tissue.  RECOMMENDATION: Screening mammogram in one year.(Code:SM-B-01Y)  I have discussed the findings and recommendations with the patient. Results were also provided in writing at the conclusion of the visit. If applicable, a reminder letter will  be sent to the patient regarding the next appointment.  BI-RADS CATEGORY  1: Negative.   Electronically Signed   By: Evangeline Dakin M.D.   On: 07/20/2016 15:14   Dense breast tissue noted and 1 y recall   dexa 1/18- mild osteopenia  Taking ca and D  No fractures  She had a fall in the spring - she stepped back from a front porch and fell back  No broken bones  Had staples in her scalp   Needs triamcinolone cream on hand for poison ivy prn  Does not have it now   Patient Active Problem List   Diagnosis Date Noted  . Contact dermatitis 02/29/2016  . Hyperlipidemia 06/02/2015  . Routine general medical examination at a health care facility 06/02/2015  . 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 Osteopenia 07/15/2014  . Estrogen deficiency 06/07/2014  . Abnormal breast biopsy 06/07/2014  . Encounter for Medicare annual wellness exam 05/24/2013  . 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 Screening for lipoid disorders 02/12/2011  . Diabetes mellitus screening 02/12/2011  . GERD (gastroesophageal reflux disease) 02/12/2011   Past Medical History:  Diagnosis Date  . Back pain    due to large breasts  . Fever blister   . GERD (gastroesophageal reflux disease)   . No pertinent past medical history   . Overweight(278.02)    Past Surgical History:  Procedure Laterality Date  . BREAST REDUCTION SURGERY  12/01/13  . BUNIONECTOMY  1988  . cataract surgery  2010  . CESAREAN SECTION    . MASS EXCISION  06/08/2011   Procedure: EXCISION MASS;  Surgeon: Herbie Baltimore  Christena Flake., MD;  Location: [place] [facility];  Service: Orthopedics;  Laterality: Right;  excisional biopsy of exostosis right thumb  . TONSILLECTOMY     Social History  Substance Use Topics  . Smoking status: Former Smoker    Quit date: 06/07/1975  . Smokeless tobacco: Never Used     Comment: quit 1976  . Alcohol use 0.0 oz/week     Comment: occasional wine/alcohol   Family History    Problem Relation Age of Onset  . Asthma Mother   . Cancer Father        brain tumor  . Heart disease Maternal Grandfather        MI   Allergies  Allergen Reactions  . Penicillins     REACTION: Pt said not sure if she was allergic  to penicillin. Pt said 80 + years ago was told allergic to penicillin. Not sure what type reaction.   Current Outpatient Prescriptions on File Prior to Visit  Medication Sig Dispense Refill  . Calcium Carbonate-Vitamin D (CALCIUM + D PO) Take 1 capsule by mouth 2 (two) times daily.    . Cholecalciferol (VITAMIN D) 1000 UNITS capsule Take 1,000 Units by mouth daily.      Marland Kitchen estradiol (ESTRACE) 0.1 MG/GM vaginal cream Use 1-2 cm in applicator intravaginally twice a week 42.5 g 3  . Multiple Vitamin (MULTIVITAMIN) tablet Take 1 tablet by mouth daily.       No current facility-administered medications on file prior to visit.     Review of Systems Review of Systems  Constitutional: Negative for fever, appetite change, fatigue and unexpected weight change.  Eyes: Negative for pain and visual disturbance.  Respiratory: Negative for cough and shortness of breath.   Cardiovascular: Negative for cp or palpitations    Gastrointestinal: Negative for nausea, diarrhea and constipation.  Genitourinary: Negative for urgency and frequency.  Skin: Negative for pallor or rash   Neurological: Negative for weakness, light-headedness, numbness and headaches.  Hematological: Negative for adenopathy. Does not bruise/bleed easily.  Psychiatric/Behavioral: Negative for dysphoric mood. The patient is not nervous/anxious.         Objective:   Physical Exam  Constitutional: She appears well-developed and well-nourished. No distress.  Well appearing   HENT:  Head: Normocephalic and atraumatic.  Mouth/Throat: Oropharynx is clear and moist.  Eyes: Pupils are equal, round, and reactive to light. Conjunctivae and EOM are normal. No scleral icterus.  Neck: Normal range of motion.  Neck supple.  Cardiovascular: Normal rate, regular rhythm and normal heart sounds.   Pulmonary/Chest: Effort normal and breath sounds normal. No respiratory distress. She has no wheezes.  Genitourinary:  Genitourinary Comments: Breast exam: No mass, nodules, thickening, tenderness, bulging, retraction, inflamation, nipple discharge or skin changes noted.  No axillary or clavicular LA.    Dense tissue noted especially in lateral breasts   Musculoskeletal:  No kyphosis   Lymphadenopathy:    She has no cervical adenopathy.  Neurological: She is alert.  Skin: Skin is warm and dry. No rash noted. No erythema.  Psychiatric: She has a normal mood and affect.          Assessment & Plan:   Problem List Items Addressed This Visit      Musculoskeletal and Integument   Contact dermatitis    Refilled triamcinolone to have on hand for poison ivy      Osteopenia    Reviewed dexa 1/18  No fractures  On ca and D  Disc need  for calcium/ vitamin D/ wt bearing exercise and bone density test every 2 y to monitor Disc safety/ fracture risk in detail   She is walking diligently for exercise         Other   Atypical lobular hyperplasia of breast - Primary    6 mo breast exam nl today  Dense tissue noted Rev mammogram   F/u jan for annual exam Due for her mammogram then as well

## 2017-01-28 NOTE — Patient Instructions (Signed)
Reassuring breast exams Keep doing self exams   Exercise and take calcium and D for bone health   Mammogram due in January   We will schedule your annual exam at check out

## 2017-01-28 NOTE — Assessment & Plan Note (Signed)
6 mo breast exam nl today  Dense tissue noted Rev mammogram   F/u jan for annual exam Due for her mammogram then as well

## 2017-04-18 ENCOUNTER — Ambulatory Visit (INDEPENDENT_AMBULATORY_CARE_PROVIDER_SITE_OTHER): Payer: Medicare Other

## 2017-04-18 DIAGNOSIS — Z23 Encounter for immunization: Secondary | ICD-10-CM

## 2017-05-06 ENCOUNTER — Emergency Department (HOSPITAL_COMMUNITY)
Admission: EM | Admit: 2017-05-06 | Discharge: 2017-05-06 | Disposition: A | Discharge status: Home / Self Care | Payer: Medicare Other | Attending: Emergency Medicine | Admitting: Emergency Medicine

## 2017-05-06 ENCOUNTER — Ambulatory Visit: Payer: Self-pay

## 2017-05-06 ENCOUNTER — Emergency Department (HOSPITAL_COMMUNITY): Payer: Medicare Other

## 2017-05-06 ENCOUNTER — Other Ambulatory Visit: Payer: Self-pay

## 2017-05-06 ENCOUNTER — Encounter (HOSPITAL_COMMUNITY): Payer: Self-pay | Admitting: Emergency Medicine

## 2017-05-06 DIAGNOSIS — M25512 Pain in left shoulder: Secondary | ICD-10-CM | POA: Insufficient documentation

## 2017-05-06 DIAGNOSIS — Z87891 Personal history of nicotine dependence: Secondary | ICD-10-CM | POA: Insufficient documentation

## 2017-05-06 DIAGNOSIS — M542 Cervicalgia: Secondary | ICD-10-CM | POA: Insufficient documentation

## 2017-05-06 DIAGNOSIS — Z79899 Other long term (current) drug therapy: Secondary | ICD-10-CM | POA: Insufficient documentation

## 2017-05-06 DIAGNOSIS — R110 Nausea: Secondary | ICD-10-CM | POA: Insufficient documentation

## 2017-05-06 DIAGNOSIS — R0789 Other chest pain: Secondary | ICD-10-CM | POA: Diagnosis not present

## 2017-05-06 LAB — CBC
HEMATOCRIT: 42.5 % (ref 36.0–46.0)
Hemoglobin: 14.1 g/dL (ref 12.0–15.0)
MCH: 30.5 pg (ref 26.0–34.0)
MCHC: 33.2 g/dL (ref 30.0–36.0)
MCV: 92 fL (ref 78.0–100.0)
PLATELETS: 294 10*3/uL (ref 150–400)
RBC: 4.62 MIL/uL (ref 3.87–5.11)
RDW: 13.3 % (ref 11.5–15.5)
WBC: 6.9 10*3/uL (ref 4.0–10.5)

## 2017-05-06 LAB — I-STAT TROPONIN, ED
TROPONIN I, POC: 0 ng/mL (ref 0.00–0.08)
Troponin i, poc: 0 ng/mL (ref 0.00–0.08)

## 2017-05-06 LAB — BASIC METABOLIC PANEL
Anion gap: 8 (ref 5–15)
BUN: 11 mg/dL (ref 6–20)
CHLORIDE: 105 mmol/L (ref 101–111)
CO2: 25 mmol/L (ref 22–32)
CREATININE: 0.75 mg/dL (ref 0.44–1.00)
Calcium: 9.5 mg/dL (ref 8.9–10.3)
GFR calc Af Amer: >60 mL/min (ref >60)
GLUCOSE: 104 mg/dL — AB (ref 65–99)
POTASSIUM: 3.9 mmol/L (ref 3.5–5.1)
SODIUM: 138 mmol/L (ref 135–145)

## 2017-05-06 MED ORDER — ACETAMINOPHEN 325 MG PO TABS
650.0000 mg | ORAL_TABLET | Freq: Once | ORAL | Status: DC
  Filled 2017-05-06: qty 2

## 2017-05-06 NOTE — Telephone Encounter (Signed)
Pt. Called office to report that left arm pain has now moved into her left shoulder and left breast area.  Stated she has some shortness of breath.  Rated the pain at 5/10.  Described as a "constant dull ache."  Denied current symptoms of jaw pain, sweating or nausea.  C/o  feeling flushed.  Denied previous heart problems.    Pt. stated her husband is with her; Advised the pt. to go to the nearest ER.  Stated she is about 30 min. away from [facility].  The pt. voiced concern that she may be over-reacting; stated "what if this is nothing, and only heartburn?"  "I have an appt. with my doctor tomorrow."  Advised the pt. that with her current symptoms, she needs to be evaluated to rule out cardiac cause.  Reassured pt. that the Triage nurse feels her symptoms need to be assessed today.  Questioned that, since she is 30 minutes away, and if her symptoms have worsened, she may need to call 911.  The pt. stated "there have been times this morning that I wasn't even aware of the pain, so I'd rather my husband take me to the hospital."  Encouraged the pt. to leave now for the ER.  Verb. Understanding/ Agreed.             Reason for Disposition . Pain also present in shoulder(s) or arm(s) or jaw  (Exception: pain is clearly made worse by movement)  Answer Assessment - Initial Assessment Questions 1. LOCATION: "Where does it hurt?"       Left arm pain x 1 week; elbow to under arm 2. RADIATION: "Does the pain go anywhere else?" (e.g., into neck, jaw, arms, back)     Has moved from left arm to left breast and under left arm  3. ONSET: "When did the chest pain begin?" (Minutes, hours or days)      Chest pain started this AM; approx. 5:30 AM  4. PATTERN "Does the pain come and go, or has it been constant since it started?"  "Does it get worse with exertion?"      A constant dull ache 5. DURATION: "How long does it last" (e.g., seconds, minutes, hours)     Constant left chest, shoulder and under left  arm 6. SEVERITY: "How bad is the pain?"  (e.g., Scale 1-10; mild, moderate, or severe)    - MILD (1-3): doesn't interfere with normal activities     - MODERATE (4-7): interferes with normal activities or awakens from sleep    - SEVERE (8-10): excruciating pain, unable to do any normal activities       "Dull ache about 5-6" 7. CARDIAC RISK FACTORS: "Do you have any history of heart problems or risk factors for heart disease?" (e.g., prior heart attack, angina; high blood pressure, diabetes, being overweight, high cholesterol, smoking, or strong family history of heart disease)     None of above 8. PULMONARY RISK FACTORS: "Do you have any history of lung disease?"  (e.g., blood clots in lung, asthma, emphysema, birth control pills)     I'm not gasping for breath, but I do feel a touch of shortness of breath   9. CAUSE: "What do you think is causing the chest pain?"     unknown 10. OTHER SYMPTOMS: "Do you have any other symptoms?" (e.g., dizziness, nausea, vomiting, sweating, fever, difficulty breathing, cough)       I feel flushed and hot; no sweating, no nausea 11. PREGNANCY: "Is  there any chance you are pregnant?" "When was your last menstrual period?"       no  Protocols used: CHEST PAIN-A-AH

## 2017-05-06 NOTE — Telephone Encounter (Signed)
It looks like she is in the ED now-I will continue to follow Thanks

## 2017-05-06 NOTE — ED Triage Notes (Signed)
PAIN IN LEFT ARM X 1 WEEK AND THEN THIS AM HAD PAIN IN HER CHEST AND JAW

## 2017-05-06 NOTE — ED Provider Notes (Signed)
[facility] Provider Note   CSN: [id number] Arrival date & time: 05/06/17  1237     History   Chief Complaint Chief Complaint  Patient presents with  . Chest Pain    HPI Bailey Stephens is a 71 y.o. female.  Patient is a 71 year old healthy female with no known medical problems presenting today with left-sided chest, shoulder and neck pain.  Patient states for the last 1 week she has had pain in her left shoulder.  It seems to be worse when she wakes up in the morning and with certain positioning and movements of the arm.  She walks between 16-20 miles per week and states that the pain does not seem to be worse with walking.  She states this morning at 5 AM she woke up and had pain in the shoulder that went around into the left side of her chest and then into the left side of her neck.  She states the longer the pain was there the more anxious she became and she felt some mild pain in the left side of her jaw and felt a little clammy but denies breaking out in a sweat.  She felt a little nauseated but denies vomiting.  She did eat something around 9:00 this morning which did not make the pain any worse.  Walking which she has done a limited amount of today has not seem to worsen her symptoms.  They have slowly been improving despite no treatment.  She denies any recent injury but is very active.  She is right-handed.  She denies any numbness or tingling of the hand and no abdominal pain.   The history is provided by the patient.  Chest Pain   This is a new problem. The current episode started 6 to 12 hours ago. The problem occurs constantly. The problem has been gradually improving. Associated with: nothing. The pain is present in the lateral region. The pain is at a severity of 6/10. The pain is moderate. The quality of the pain is described as dull (aching). The pain radiates to the left jaw. Duration of episode(s) is 12 hours. Exacerbated by: not  seemed to be affected by exertion. Associated symptoms include nausea. Pertinent negatives include no abdominal pain, no cough, no exertional chest pressure, no fever, no headaches, no irregular heartbeat, no leg pain, no lower extremity edema, no palpitations, no shortness of breath, no sputum production, no syncope and no vomiting. She has tried nothing for the symptoms. The treatment provided moderate relief. Risk factors include being elderly.  Pertinent negatives for past medical history include no CAD, no COPD, no CHF, no diabetes, no DVT, no hyperlipidemia, no hypertension, no MI, no PE and no strokes.  Pertinent negatives for family medical history include: no early MI and no sudden death. Family history comments: maternal grandmother with MI in the 53's and a cousin with MI in her 59's.  No MI in parents or siblings  Procedure history is negative for cardiac catheterization and stress echo.    Past Medical History:  Diagnosis Date  . Back pain    due to large breasts  . Fever blister   . GERD (gastroesophageal reflux disease)   . No pertinent past medical history   . Overweight(278.02)     Patient Active Problem List   Diagnosis Date Noted  . Contact dermatitis 02/29/2016  . Hyperlipidemia 06/02/2015  . Routine general medical examination at a health care facility 06/02/2015  .  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 Osteopenia 07/15/2014  . Estrogen deficiency 06/07/2014  . Abnormal breast biopsy 06/07/2014  . Encounter for Medicare annual wellness exam 05/24/2013  . 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 Screening for lipoid disorders 02/12/2011  . Diabetes mellitus screening 02/12/2011  . GERD (gastroesophageal reflux disease) 02/12/2011    Past Surgical History:  Procedure Laterality Date  . BREAST REDUCTION SURGERY  12/01/13  . BUNIONECTOMY  1988  . cataract surgery  2010  . CESAREAN SECTION    . TONSILLECTOMY      OB History    No data  available       Home Medications    Prior to Admission medications   Medication Sig Start Date End Date Taking? Authorizing Provider  Calcium Carbonate-Vitamin D (CALCIUM + D PO) Take 1 capsule by mouth 2 (two) times daily.    [provider]  Cholecalciferol (VITAMIN D) 1000 UNITS capsule Take 1,000 Units by mouth daily.      [provider]  estradiol (ESTRACE) 0.1 MG/GM vaginal cream Use 1-2 cm in applicator intravaginally twice a week 07/06/16 07/06/19  Tower, Wynelle Fanny, MD  Multiple Vitamin (MULTIVITAMIN) tablet Take 1 tablet by mouth daily.      [provider]  triamcinolone cream (KENALOG) 0.1 % APPLY TOPICALLYTO THE AFFECTED AREAS 2 (TWO) TIMES DAILY AS NEEDED. 01/28/17   Tower, Wynelle Fanny, MD    Family History Family History  Problem Relation Age of Onset  . Asthma Mother   . Cancer Father        brain tumor  . Heart disease Maternal Grandfather        MI    Social History Social History   Tobacco Use  . Smoking status: Former Smoker    Last attempt to quit: 06/07/1975    Years since quitting: 41.9  . Smokeless tobacco: Never Used  . Tobacco comment: quit 1976  Substance Use Topics  . Alcohol use: Yes    Alcohol/week: 0.0 oz    Comment: occasional wine/alcohol  . Drug use: No     Allergies   Penicillins   Review of Systems Review of Systems  Constitutional: Negative for fever.  Respiratory: Negative for cough, sputum production and shortness of breath.   Cardiovascular: Positive for chest pain. Negative for palpitations and syncope.  Gastrointestinal: Positive for nausea. Negative for abdominal pain and vomiting.  Neurological: Negative for headaches.  All other systems reviewed and are negative.    Physical Exam Updated Vital Signs BP 119/89   Pulse 91   Temp 98.7 F (37.1 C) (Oral)   Resp (!) 23   SpO2 99%   Physical Exam  Constitutional: She is oriented to person, place, and time. She appears well-developed and  well-nourished. No distress.  HENT:  Head: Normocephalic and atraumatic.  Mouth/Throat: Oropharynx is clear and moist.  Eyes: Conjunctivae and EOM are normal. Pupils are equal, round, and reactive to light.  Neck: Normal range of motion. Neck supple.  Cardiovascular: Normal rate, regular rhythm and intact distal pulses.  No murmur heard.  No systolic murmur is present. Pulses:      Radial pulses are 2+ on the right side, and 2+ on the left side.  Pulmonary/Chest: Effort normal and breath sounds normal. No respiratory distress. She has no decreased breath sounds. She has no wheezes. She has no rales. She exhibits bony tenderness.    Abdominal: Soft. She exhibits no distension. There is no tenderness. There is  no rebound and no guarding.  Musculoskeletal: Normal range of motion. She exhibits no edema.       Left shoulder: She exhibits tenderness and bony tenderness. She exhibits normal range of motion, no deformity, no spasm, normal pulse and normal strength.       Arms: Neurological: She is alert and oriented to person, place, and time.  Skin: Skin is warm and dry. No rash noted. No erythema.  Psychiatric: She has a normal mood and affect. Her behavior is normal.  Nursing note and vitals reviewed.    ED Treatments / Results  Labs (all labs ordered are listed, but only abnormal results are displayed) Labs Reviewed  BASIC METABOLIC PANEL - Abnormal; Notable for the following components:      Result Value   Glucose, Bld 104 (*)    All other components within normal limits  CBC  I-STAT TROPONIN, ED  I-STAT TROPONIN, ED    EKG  EKG Interpretation  Date/Time:  Monday May 06 2017 12:41:17 EST Ventricular Rate:  80 PR Interval:  146 QRS Duration: 76 QT Interval:  388 QTC Calculation: 447 R Axis:   162 Text Interpretation:  Normal sinus rhythm with sinus arrhythmia Right axis deviation Anterior infarct , age undetermined No previous tracing Confirmed by Blanchie Dessert  [phone]) on 05/06/2017 5:58:45 PM       Radiology Dg Chest 2 View  Result Date: 05/06/2017 CLINICAL DATA:  Chest pain radiating to left jaw EXAM: CHEST  2 VIEW COMPARISON:  None. FINDINGS: The heart size and mediastinal contours are within normal limits. Both lungs are clear. The visualized skeletal structures are unremarkable. IMPRESSION: Clear lungs. Electronically Signed   By: Ulyses Jarred M.D.   On: 05/06/2017 13:31    Procedures Procedures (including critical care time)  Medications Ordered in ED Medications  acetaminophen (TYLENOL) tablet 650 mg (not administered)     Initial Impression / Assessment and Plan / ED Course  I have reviewed the triage vital signs and the nursing notes.  Pertinent labs & imaging results that were available during my care of the patient were reviewed by me and considered in my medical decision making (see chart for details).     Patient presenting with an atypical story of chest pain.  She has had shoulder pain fairly consistently all week but always worse when she wakes up in the morning.  Today she had pain in the left side of her chest that went into her jaw.  The pain has been fairly constant for the last 12 hours.  It has improved but is not completely gone.  She denies any shortness of breath and has a low risk Wells score with low suspicion for PE.  Patient's symptoms are not suggestive of dissection.  Pulses are equal in all extremities.  Patient has no abdominal pain and low suspicion that this is GERD today.  Patient's EKG without acute findings.  Heart score of 2 for age.  Troponin is within normal limits.  BMP and CBC without acute findings.  Suspicion that patient's symptoms are most likely musculoskeletal as pain is reproduced with palpation.  There is no evidence of shingles. Will get a delta troponin and repeat EKG.  Patient given Tylenol.  7:28 PM Repeat EKG and trop are wnl.  Final Clinical Impressions(s) / ED Diagnoses   Final  diagnoses:  Chest wall pain  Acute pain of left shoulder    ED Discharge Orders    None  Blanchie Dessert, MD 05/06/17 [phone]

## 2017-05-07 ENCOUNTER — Ambulatory Visit: Payer: Medicare Other | Admitting: Family Medicine

## 2017-06-21 ENCOUNTER — Other Ambulatory Visit: Payer: Self-pay | Admitting: Family Medicine

## 2017-06-21 DIAGNOSIS — Z1231 Encounter for screening mammogram for malignant neoplasm of breast: Secondary | ICD-10-CM

## 2017-07-18 ENCOUNTER — Ambulatory Visit
Admission: RE | Admit: 2017-07-18 | Discharge: 2017-07-18 | Disposition: A | Discharge status: Home / Self Care | Payer: Medicare Other | Source: Ambulatory Visit | Attending: Family Medicine | Admitting: Family Medicine

## 2017-07-18 DIAGNOSIS — Z1231 Encounter for screening mammogram for malignant neoplasm of breast: Secondary | ICD-10-CM

## 2017-07-21 ENCOUNTER — Telehealth: Payer: Self-pay | Admitting: Family Medicine

## 2017-07-21 DIAGNOSIS — E7800 Pure hypercholesterolemia, unspecified: Secondary | ICD-10-CM

## 2017-07-21 DIAGNOSIS — R7303 Prediabetes: Secondary | ICD-10-CM | POA: Insufficient documentation

## 2017-07-21 DIAGNOSIS — Z13220 Encounter for screening for lipoid disorders: Secondary | ICD-10-CM

## 2017-07-21 DIAGNOSIS — Z0000 Encounter for general adult medical examination without abnormal findings: Secondary | ICD-10-CM

## 2017-07-21 DIAGNOSIS — R7309 Other abnormal glucose: Secondary | ICD-10-CM

## 2017-07-21 DIAGNOSIS — Z131 Encounter for screening for diabetes mellitus: Secondary | ICD-10-CM

## 2017-07-21 NOTE — Telephone Encounter (Signed)
-----   Message from Ellamae Sia sent at 07/18/2017  4:02 PM EST ----- Regarding: Lab orders for Tuesdady, 1.22.19  AWV lab orders, please.

## 2017-07-23 ENCOUNTER — Ambulatory Visit (INDEPENDENT_AMBULATORY_CARE_PROVIDER_SITE_OTHER): Payer: Medicare Other

## 2017-07-23 ENCOUNTER — Other Ambulatory Visit: Payer: Medicare Other

## 2017-07-23 VITALS — BP 122/70 | HR 74 | Temp 98.0°F | Ht 61.0 in | Wt 141.8 lb

## 2017-07-23 DIAGNOSIS — R7309 Other abnormal glucose: Secondary | ICD-10-CM | POA: Diagnosis not present

## 2017-07-23 DIAGNOSIS — M8580 Other specified disorders of bone density and structure, unspecified site: Secondary | ICD-10-CM

## 2017-07-23 DIAGNOSIS — Z0000 Encounter for general adult medical examination without abnormal findings: Secondary | ICD-10-CM

## 2017-07-23 DIAGNOSIS — E7800 Pure hypercholesterolemia, unspecified: Secondary | ICD-10-CM | POA: Diagnosis not present

## 2017-07-23 LAB — COMPREHENSIVE METABOLIC PANEL
ALK PHOS: 58 U/L (ref 39–117)
ALT: 13 U/L (ref 0–35)
AST: 15 U/L (ref 0–37)
Albumin: 4 g/dL (ref 3.5–5.2)
BUN: 15 mg/dL (ref 6–23)
CO2: 30 mEq/L (ref 19–32)
CREATININE: 0.76 mg/dL (ref 0.40–1.20)
Calcium: 9.3 mg/dL (ref 8.4–10.5)
Chloride: 107 mEq/L (ref 96–112)
GFR: 79.09 mL/min (ref >60.00)
GLUCOSE: 97 mg/dL (ref 70–99)
Potassium: 3.8 mEq/L (ref 3.5–5.1)
SODIUM: 142 meq/L (ref 135–145)
TOTAL PROTEIN: 6.7 g/dL (ref 6.0–8.3)
Total Bilirubin: 0.4 mg/dL (ref 0.2–1.2)

## 2017-07-23 LAB — VITAMIN D 25 HYDROXY (VIT D DEFICIENCY, FRACTURES): VITD: 18.44 ng/mL — AB (ref 30.00–100.00)

## 2017-07-23 LAB — CBC WITH DIFFERENTIAL/PLATELET
BASOS PCT: 0.6 % (ref 0.0–3.0)
Basophils Absolute: 0 10*3/uL (ref 0.0–0.1)
EOS PCT: 3.6 % (ref 0.0–5.0)
Eosinophils Absolute: 0.2 10*3/uL (ref 0.0–0.7)
HCT: 40.2 % (ref 36.0–46.0)
Hemoglobin: 13.5 g/dL (ref 12.0–15.0)
LYMPHS ABS: 1.4 10*3/uL (ref 0.7–4.0)
Lymphocytes Relative: 21.2 % (ref 12.0–46.0)
MCHC: 33.5 g/dL (ref 30.0–36.0)
MCV: 92.4 fl (ref 78.0–100.0)
MONO ABS: 0.5 10*3/uL (ref 0.1–1.0)
MONOS PCT: 8.2 % (ref 3.0–12.0)
NEUTROS ABS: 4.4 10*3/uL (ref 1.4–7.7)
NEUTROS PCT: 66.4 % (ref 43.0–77.0)
PLATELETS: 282 10*3/uL (ref 150.0–400.0)
RBC: 4.35 Mil/uL (ref 3.87–5.11)
RDW: 13 % (ref 11.5–15.5)
WBC: 6.7 10*3/uL (ref 4.0–10.5)

## 2017-07-23 LAB — LIPID PANEL
CHOLESTEROL: 152 mg/dL (ref 0–200)
HDL: 62.1 mg/dL (ref >39.00)
LDL Cholesterol: 67 mg/dL (ref 0–99)
NonHDL: 89.67
Total CHOL/HDL Ratio: 2
Triglycerides: 115 mg/dL (ref 0.0–149.0)
VLDL: 23 mg/dL (ref 0.0–40.0)

## 2017-07-23 LAB — HEMOGLOBIN A1C: HEMOGLOBIN A1C: 5.8 % (ref 4.6–6.5)

## 2017-07-23 LAB — TSH: TSH: 2.42 u[IU]/mL (ref 0.35–4.50)

## 2017-07-23 NOTE — Progress Notes (Signed)
PCP notes:   Health maintenance:  No gaps identified.   Abnormal screenings:   Fall risk - hx of accidental fall with injury and medical treatment   Patient concerns:   None  Nurse concerns:  None  Next PCP appt:   07/26/17 @ 1130 I reviewed health advisor's note, was available for consultation, and agree with documentation and plan. Loura Pardon MD

## 2017-07-23 NOTE — Progress Notes (Signed)
Pre visit review using our clinic review tool, if applicable. No additional management support is needed unless otherwise documented below in the visit note. 

## 2017-07-23 NOTE — Patient Instructions (Signed)
Bailey Stephens , Thank you for taking time to come for your Medicare Wellness Visit. I appreciate your ongoing commitment to your health goals. Please review the following plan we discussed and let me know if I can assist you in the future.   These are the goals we discussed: Goals    . Weight (lb) < 131 lb (59.4 kg)     Target weight is 130 lbs. Starting 07/23/17, I will make healthier choices for breakfast.        This is a list of the screening recommended for you and due dates:  Health Maintenance  Topic Date Due  . Colon Cancer Screening  08/01/2018*  . Mammogram  07/18/2018  . Tetanus Vaccine  08/12/2019  . Flu Shot  Completed  . DEXA scan (bone density measurement)  Completed  .  Hepatitis C: One time screening is recommended by Center for Disease Control  (CDC) for  adults born from [birth year] through 1967.   Completed  . Pneumonia vaccines  Completed  *Topic was postponed. The date shown is not the original due date.   Preventive Care for Adults  A healthy lifestyle and preventive care can promote health and wellness. Preventive health guidelines for adults include the following key practices.  . A routine yearly physical is a good way to check with your health care provider about your health and preventive screening. It is a chance to share any concerns and updates on your health and to receive a thorough exam.  . Visit your dentist for a routine exam and preventive care every 6 months. Brush your teeth twice a day and floss once a day. Good oral hygiene prevents tooth decay and gum disease.  . The frequency of eye exams is based on your age, health, family medical history, use  of contact lenses, and other factors. Follow your health care provider's recommendations for frequency of eye exams.  . Eat a healthy diet. Foods like vegetables, fruits, whole grains, low-fat dairy products, and lean protein foods contain the nutrients you need without too many calories. Decrease your  intake of foods high in solid fats, added sugars, and salt. Eat the right amount of calories for you. Get information about a proper diet from your health care provider, if necessary.  . Regular physical exercise is one of the most important things you can do for your health. Most adults should get at least 150 minutes of moderate-intensity exercise (any activity that increases your heart rate and causes you to sweat) each week. In addition, most adults need muscle-strengthening exercises on 2 or more days a week.  Silver Sneakers may be a benefit available to you. To determine eligibility, you may visit the website: www.silversneakers.com or contact program at 820-726-9635 Mon-Fri between 8AM-8PM.   . Maintain a healthy weight. The body mass index (BMI) is a screening tool to identify possible weight problems. It provides an estimate of body fat based on height and weight. Your health care provider can find your BMI and can help you achieve or maintain a healthy weight.   For adults 20 years and older: ? A BMI below 18.5 is considered underweight. ? A BMI of 18.5 to 24.9 is normal. ? A BMI of 25 to 29.9 is considered overweight. ? A BMI of 30 and above is considered obese.   . Maintain normal blood lipids and cholesterol levels by exercising and minimizing your intake of saturated fat. Eat a balanced diet with plenty of fruit and vegetables.  Blood tests for lipids and cholesterol should begin at age 33 and be repeated every 5 years. If your lipid or cholesterol levels are high, you are over 50, or you are at high risk for heart disease, you may need your cholesterol levels checked more frequently. Ongoing high lipid and cholesterol levels should be treated with medicines if diet and exercise are not working.  . If you smoke, find out from your health care provider how to quit. If you do not use tobacco, please do not start.  . If you choose to drink alcohol, please do not consume more than 2  drinks per day. One drink is considered to be 12 ounces (355 mL) of beer, 5 ounces (148 mL) of wine, or 1.5 ounces (44 mL) of liquor.  . If you are 67-50 years old, ask your health care provider if you should take aspirin to prevent strokes.  . Use sunscreen. Apply sunscreen liberally and repeatedly throughout the day. You should seek shade when your shadow is shorter than you. Protect yourself by wearing long sleeves, pants, a wide-brimmed hat, and sunglasses year round, whenever you are outdoors.  . Once a month, do a whole body skin exam, using a mirror to look at the skin on your back. Tell your health care provider of new moles, moles that have irregular borders, moles that are larger than a pencil eraser, or moles that have changed in shape or color.

## 2017-07-23 NOTE — Progress Notes (Signed)
Subjective:   Bailey Stephens is a 72 y.o. female who presents for Medicare Annual (Subsequent) preventive examination.  Review of Systems:  N/A Cardiac Risk Factors include: advanced age (>3men, >62 women);dyslipidemia     Objective:     Vitals: BP 122/70 (BP Location: Right Arm, Patient Position: Sitting, Cuff Size: Normal)   Pulse 74   Temp 98 F (36.7 C) (Oral)   Ht 5\' 1"  (1.549 m) Comment: no shoes  Wt 141 lb 12 oz (64.3 kg)   SpO2 94%   BMI 26.78 kg/m   Body mass index is 26.78 kg/m.  Advanced Directives 07/23/2017 05/06/2017 09/08/2016 07/23/2016 06/08/2011 06/07/2011  Does Patient Have a Medical Advance Directive? Yes No No Yes Patient does not have advance directive Patient does not have advance directive  Type of Advance Directive [place];Living will - - [place];Living will [place] in Chart? No - copy requested - - No - copy requested - -    Tobacco Social History   Tobacco Use  Smoking Status Former Smoker  . Last attempt to quit: 06/07/1975  . Years since quitting: 42.1  Smokeless Tobacco Never Used  Tobacco Comment   quit 1976     Counseling given: No Comment: quit 1976   Clinical Intake:  Pre-visit preparation completed: Yes  Pain : No/denies pain Pain Score: 0-No pain     Nutritional Status: BMI of 19-24  Normal Nutritional Risks: None Diabetes: No  How often do you need to have someone help you when you read instructions, pamphlets, or other written materials from your doctor or pharmacy?: 1 - Never What is the last grade level you completed in school?: Bachelor degree  Interpreter Needed?: No  Comments: pt lives with spouse Information entered by :: LPinson, LPN  Past Medical History:  Diagnosis Date  . Back pain    due to large breasts  . Fever blister   . GERD (gastroesophageal reflux disease)   . No pertinent past medical  history   . Overweight(278.02)    Past Surgical History:  Procedure Laterality Date  . BREAST REDUCTION SURGERY  12/01/13  . BUNIONECTOMY  1988  . cataract surgery  2010  . CESAREAN SECTION    . MASS EXCISION  06/08/2011   Procedure: EXCISION MASS;  Surgeon: Cammie Sickle., MD;  Location: [facility];  Service: Orthopedics;  Laterality: Right;  excisional biopsy of exostosis right thumb  . REDUCTION MAMMAPLASTY Bilateral 2014  . TONSILLECTOMY     Family History  Problem Relation Age of Onset  . Asthma Mother   . Cancer Father        brain tumor  . Heart disease Maternal Grandfather        MI  . Breast cancer Sister    Social History   Socioeconomic History  . Marital status: Married    Spouse name: None  . Number of children: None  . Years of education: None  . Highest education level: None  Social Needs  . Financial resource strain: None  . Food insecurity - worry: None  . Food insecurity - inability: None  . Transportation needs - medical: None  . Transportation needs - non-medical: None  Occupational History  . None  Tobacco Use  . Smoking status: Former Smoker    Last attempt to quit: 06/07/1975    Years since quitting: 42.1  . Smokeless tobacco:  Never Used  . Tobacco comment: quit 1976  Substance and Sexual Activity  . Alcohol use: Yes    Alcohol/week: 1.2 - 1.8 oz    Types: 2 - 3 Glasses of wine per week    Comment: occasional wine/alcohol  . Drug use: No  . Sexual activity: Yes  Other Topics Concern  . None  Social History Narrative  . None    Outpatient Encounter Medications as of 07/23/2017  Medication Sig  . Calcium Carbonate-Vitamin D (CALCIUM + D PO) Take 1 capsule by mouth 2 (two) times daily.  . Cholecalciferol (VITAMIN D) 1000 UNITS capsule Take 1,000 Units by mouth daily.    Marland Kitchen Dextran 70-Hypromellose, PF, (ARTIFICIAL TEARS PF) 0.1-0.3 % SOLN Place 1-2 drops 2 (two) times daily as needed into both eyes (for dry eyes).  Marland Kitchen  estradiol (ESTRACE) 0.1 MG/GM vaginal cream Use 1-2 cm in applicator intravaginally twice a week  . Multiple Vitamin (MULTIVITAMIN) tablet Take 1 tablet by mouth daily.    Marland Kitchen triamcinolone cream (KENALOG) 0.1 % APPLY TOPICALLYTO THE AFFECTED AREAS 2 (TWO) TIMES DAILY AS NEEDED. (Patient taking differently: Apply 1 application 2 (two) times daily as needed topically (to affected areas for itchiness). )   No facility-administered encounter medications on file as of 07/23/2017.     Activities of Daily Living In your present state of health, do you have any difficulty performing the following activities: 07/23/2017 07/23/2016  Hearing? N N  Vision? N N  Difficulty concentrating or making decisions? N N  Walking or climbing stairs? N N  Dressing or bathing? N N  Doing errands, shopping? N N  Preparing Food and eating ? N N  Using the Toilet? N N  In the past six months, have you accidently leaked urine? N N  Do you have problems with loss of bowel control? N N  Managing your Medications? N N  Managing your Finances? N N  Housekeeping or managing your Housekeeping? N N  Some recent data might be hidden    Patient Care Team: Tower, Wynelle Fanny, MD as PCP - General    Assessment:   This is a routine wellness examination for [place].   Hearing Screening   125Hz  250Hz  500Hz  1000Hz  2000Hz  3000Hz  4000Hz  6000Hz  8000Hz   Right ear:   40 40 40  40    Left ear:   40 40 40  40    Vision Screening Comments: Last vision exam in Sept 2018 with Dr. Kathrin Penner    Exercise Activities and Dietary recommendations Current Exercise Habits: Home exercise routine, Type of exercise: walking, Time (Minutes): 40, Frequency (Times/Week): 7, Weekly Exercise (Minutes/Week): 280, Intensity: Moderate, Exercise limited by: None identified  Goals    . Weight (lb) < 131 lb (59.4 kg)     Target weight is 130 lbs. Starting 07/23/17, I will make healthier choices for breakfast.        Fall Risk Fall Risk  07/23/2017  07/23/2016 07/06/2016 06/10/2015 06/07/2014  Falls in the past year? Yes No No No No  Comment accidental fall; fell backwards off step; stitches to back of head - - - -  Number falls in past yr: 1 - - - -  Injury with Fall? Yes - - - -     Depression Screen PHQ 2/9 Scores 07/23/2017 07/23/2016 07/06/2016 06/10/2015  PHQ - 2 Score 0 0 0 0  PHQ- 9 Score 0 - - -     Cognitive Function MMSE - Mini Mental State Exam  07/23/2017 07/23/2016  Orientation to time 5 5  Orientation to Place 5 5  Registration 3 3  Attention/ Calculation 0 0  Recall 3 3  Language- name 2 objects 0 0  Language- repeat 1 1  Language- follow 3 step command 3 3  Language- read & follow direction 0 0  Write a sentence 0 0  Copy design 0 0  Total score 20 20     PLEASE NOTE: A Mini-Cog screen was completed. Maximum score is 20. A value of 0 denotes this part of [place] MMSE was not completed or the patient failed this part of the Mini-Cog screening.   Mini-Cog Screening Orientation to Time - Max 5 pts Orientation to Place - Max 5 pts Registration - Max 3 pts Recall - Max 3 pts Language Repeat - Max 1 pts Language Follow 3 Step Command - Max 3 pts     Immunization History  Administered Date(s) Administered  . Influenza Split 05/01/2011  . Influenza Whole 05/10/2010  . Influenza,inj,Quad PF,6+ Mos 06/05/2013, 04/23/2014, 06/03/2015, 04/06/2016, 04/18/2017  . Pneumococcal Conjugate-13 06/07/2014  . Pneumococcal Polysaccharide-23 08/11/2009  . Td 08/11/2009  . Zoster 05/28/2012    Screening Tests Health Maintenance  Topic Date Due  . COLONOSCOPY  08/01/2018 (Originally 07/02/2017)  . MAMMOGRAM  07/18/2018  . TETANUS/TDAP  08/12/2019  . INFLUENZA VACCINE  Completed  . DEXA SCAN  Completed  . Hepatitis C Screening  Completed  . PNA vac Low Risk Adult  Completed        Plan:     I have personally reviewed, addressed, and noted the following in the patient's chart:  A. Medical and social history B. Use  of alcohol, tobacco or illicit drugs  C. Current medications and supplements D. Functional ability and status E.  Nutritional status F.  Physical activity G. Advance directives H. List of other physicians I.  Hospitalizations, surgeries, and ER visits in previous 12 months J.  [place] to include hearing, vision, cognitive, depression L. Referrals and appointments - none  In addition, I have reviewed and discussed with patient certain preventive protocols, quality metrics, and best practice recommendations. A written personalized care plan for preventive services as well as general preventive health recommendations were provided to patient.  See attached scanned questionnaire for additional information.   Signed,   Lindell Noe, MHA, BS, LPN Health Coach

## 2017-07-24 ENCOUNTER — Ambulatory Visit: Payer: Medicare Other

## 2017-07-26 ENCOUNTER — Ambulatory Visit (INDEPENDENT_AMBULATORY_CARE_PROVIDER_SITE_OTHER): Payer: Medicare Other | Admitting: Family Medicine

## 2017-07-26 ENCOUNTER — Encounter: Payer: Self-pay | Admitting: Family Medicine

## 2017-07-26 VITALS — BP 126/68 | HR 65 | Temp 97.5°F | Ht 61.0 in | Wt 143.0 lb

## 2017-07-26 DIAGNOSIS — E7800 Pure hypercholesterolemia, unspecified: Secondary | ICD-10-CM

## 2017-07-26 DIAGNOSIS — N6099 Unspecified benign mammary dysplasia of unspecified breast: Secondary | ICD-10-CM

## 2017-07-26 DIAGNOSIS — Z0000 Encounter for general adult medical examination without abnormal findings: Secondary | ICD-10-CM

## 2017-07-26 DIAGNOSIS — N952 Postmenopausal atrophic vaginitis: Secondary | ICD-10-CM | POA: Diagnosis not present

## 2017-07-26 DIAGNOSIS — Z1211 Encounter for screening for malignant neoplasm of colon: Secondary | ICD-10-CM

## 2017-07-26 DIAGNOSIS — M8580 Other specified disorders of bone density and structure, unspecified site: Secondary | ICD-10-CM | POA: Diagnosis not present

## 2017-07-26 DIAGNOSIS — E559 Vitamin D deficiency, unspecified: Secondary | ICD-10-CM | POA: Insufficient documentation

## 2017-07-26 MED ORDER — ERGOCALCIFEROL 1.25 MG (50000 UT) PO CAPS: 50000.0000 [IU] | ORAL_CAPSULE | ORAL | 0 refills | Status: DC

## 2017-07-26 MED ORDER — ESTRADIOL 0.1 MG/GM VA CREA: TOPICAL_CREAM | VAGINAL | 3 refills | Status: DC

## 2017-07-26 NOTE — Assessment & Plan Note (Signed)
Nl mammogram 1/19 Nl exam Continue 6 mo exams and yearly mammograms  Monthly self exams

## 2017-07-26 NOTE — Patient Instructions (Addendum)
We will sign you up for the cologuard program   Keep working on healthy diet and exercise   Your vitamin D is low  Take ergocalciferol weekly for 12 weeks Take an extra 2000 iu of vitamin D daily along with the calcium plus D -indefinitely  This is important for bone health

## 2017-07-26 NOTE — Assessment & Plan Note (Signed)
Very good profile today  Disc goals for lipids and reasons to control them Rev labs with pt Rev low sat fat diet in detail

## 2017-07-26 NOTE — Assessment & Plan Note (Signed)
Signed up for cologuard  Not interested in colonoscopy unless diagnostic

## 2017-07-26 NOTE — Assessment & Plan Note (Signed)
Rev dexa 2018 Mild  D is low-will supplement that  One fall/no fx Disc need for calcium/ vitamin D/ wt bearing exercise and bone density test every 2 y to monitor Disc safety/ fracture risk in detail

## 2017-07-26 NOTE — Assessment & Plan Note (Signed)
Reviewed health habits including diet and exercise and skin cancer prevention Reviewed appropriate screening tests for age  Also reviewed health mt list, fam hx and immunization status , as well as social and family history   See HPI Labs rev  amw rev  Disc fall prev  Will tx low D level and closely monitor osteopenia  Enc continued exercise

## 2017-07-26 NOTE — Assessment & Plan Note (Signed)
Refilled estrogen cream which she uses sparingly

## 2017-07-26 NOTE — Assessment & Plan Note (Signed)
Level of 18 on ca plus D otc  Will give high dose tx for 12 weeks  Also add 2000 iu D3 daily indefinitely  Disc imp for bone and overall health

## 2017-07-26 NOTE — Progress Notes (Signed)
Subjective:    Patient ID: Bailey Stephens, female    DOB: 12/01/1945, 72 y.o.   MRN: [id number]  HPI Here for health maintenance exam and to review chronic medical problems   She has a cold-getting over that  Otherwise no c/o   Wt Readings from Last 3 Encounters:  07/26/17 143 lb (64.9 kg)  07/23/17 141 lb 12 oz (64.3 kg)  01/28/17 139 lb 12 oz (63.4 kg)  taking care of herself  Goal is to walk 20 mi per week (does not walk in the rain)  27.02 kg/m   Had amw 1/22 Noted one fall this year  Colonoscopy 1/09 nl  Is interested in cologuard   Mammogram 1/19-nl / did not have to go back  Self breast exam  Sister had breast cancer  Has exam in office every 6 mo for hx of abn path report after a breast reduction (atypicallobular hyperplasia)  dexa 1/18-still mild osteopenia  One fall this year (stepped back w/o looking) -she hit head and had to have staples  Is ok  Will be more aware of surroundings  No fractures  Vit D level low at 18.4  (she takes vitamin D twice daily) in calcium   BP Readings from Last 3 Encounters:  07/26/17 126/68  07/23/17 122/70  05/06/17 125/80   Cholesterol Lab Results  Component Value Date   CHOL 152 07/23/2017   CHOL 180 06/01/2016   CHOL 170 06/03/2015   Lab Results  Component Value Date   HDL 62.10 07/23/2017   HDL 70.10 06/01/2016   HDL 63.80 06/03/2015   Lab Results  Component Value Date   LDLCALC 67 07/23/2017   LDLCALC 89 06/01/2016   LDLCALC 88 06/03/2015   Lab Results  Component Value Date   TRIG 115.0 07/23/2017   TRIG 107.0 06/01/2016   TRIG 89.0 06/03/2015   Lab Results  Component Value Date   CHOLHDL 2 07/23/2017   CHOLHDL 3 06/01/2016   CHOLHDL 3 06/03/2015   No results found for: LDLDIRECT Good profile  She is eating more fish and less other meat  No fried foods  More green vegetables   Lab Results  Component Value Date   CREATININE 0.76 07/23/2017   BUN 15 07/23/2017   NA 142 07/23/2017   K 3.8  07/23/2017   CL 107 07/23/2017   CO2 30 07/23/2017   Lab Results  Component Value Date   ALT 13 07/23/2017   AST 15 07/23/2017   ALKPHOS 58 07/23/2017   BILITOT 0.4 07/23/2017    Lab Results  Component Value Date   TSH 2.42 07/23/2017    Lab Results  Component Value Date   WBC 6.7 07/23/2017   HGB 13.5 07/23/2017   HCT 40.2 07/23/2017   MCV 92.4 07/23/2017   PLT 282.0 07/23/2017      zostavax 11/13  Patient Active Problem List   Diagnosis Date Noted  . Colon cancer screening 07/26/2017  . Vitamin D deficiency 07/26/2017  . Elevated glucose 07/21/2017  . Contact dermatitis 02/29/2016  . Hyperlipidemia 06/02/2015  . Routine general medical examination at a health care facility 06/02/2015  . 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 Osteopenia 07/15/2014  . Estrogen deficiency 06/07/2014  . Abnormal breast biopsy 06/07/2014  . Encounter for Medicare annual wellness exam 05/24/2013  . 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 Screening for lipoid disorders 02/12/2011  . Diabetes mellitus screening 02/12/2011  . GERD (gastroesophageal reflux disease) 02/12/2011  Past Medical History:  Diagnosis Date  . Back pain    due to large breasts  . Fever blister   . GERD (gastroesophageal reflux disease)   . No pertinent past medical history   . Overweight(278.02)    Past Surgical History:  Procedure Laterality Date  . BREAST REDUCTION SURGERY  12/01/13  . BUNIONECTOMY  1988  . cataract surgery  2010  . CESAREAN SECTION    . MASS EXCISION  06/08/2011   Procedure: EXCISION MASS;  Surgeon: Cammie Sickle., MD;  Location: [facility];  Service: Orthopedics;  Laterality: Right;  excisional biopsy of exostosis right thumb  . REDUCTION MAMMAPLASTY Bilateral 2014  . TONSILLECTOMY     Social History   Tobacco Use  . Smoking status: Former Smoker    Last attempt to quit: 06/07/1975    Years since quitting: 42.1  . Smokeless tobacco: Never  Used  . Tobacco comment: quit 1976  Substance Use Topics  . Alcohol use: Yes    Alcohol/week: 1.2 - 1.8 oz    Types: 2 - 3 Glasses of wine per week    Comment: occasional wine/alcohol  . Drug use: No   Family History  Problem Relation Age of Onset  . Asthma Mother   . Cancer Father        brain tumor  . Heart disease Maternal Grandfather        MI  . Breast cancer Sister    Allergies  Allergen Reactions  . Penicillins Other (See Comments)    Unsure; was told 40+ years ago: Has patient had a PCN reaction causing immediate rash, facial/tongue/throat swelling, SOB or lightheadedness with hypotension: Unknown Has patient had a PCN reaction causing severe rash involving mucus membranes or skin necrosis: Unknown Has patient had a PCN reaction that required hospitalization: Unknown Has patient had a PCN reaction occurring within the last 10 years: No If all of the above answers are "NO", then may proceed with Cephalosporin use.    Current Outpatient Medications on File Prior to Visit  Medication Sig Dispense Refill  . Calcium Carbonate-Vitamin D (CALCIUM + D PO) Take 1 capsule by mouth 2 (two) times daily.    . Cholecalciferol (VITAMIN D) 1000 UNITS capsule Take 2,000 Units by mouth daily.     Marland Kitchen Dextran 70-Hypromellose, PF, (ARTIFICIAL TEARS PF) 0.1-0.3 % SOLN Place 1-2 drops 2 (two) times daily as needed into both eyes (for dry eyes).    . Multiple Vitamin (MULTIVITAMIN) tablet Take 1 tablet by mouth daily.      Marland Kitchen triamcinolone cream (KENALOG) 0.1 % APPLY TOPICALLYTO THE AFFECTED AREAS 2 (TWO) TIMES DAILY AS NEEDED. (Patient taking differently: Apply 1 application 2 (two) times daily as needed topically (to affected areas for itchiness). ) 30 g 3   No current facility-administered medications on file prior to visit.      Review of Systems  Constitutional: Negative for activity change, appetite change, fatigue, fever and unexpected weight change.  HENT: Negative for congestion, ear  pain, rhinorrhea, sinus pressure and sore throat.   Eyes: Negative for pain, redness and visual disturbance.  Respiratory: Negative for cough, shortness of breath and wheezing.   Cardiovascular: Negative for chest pain and palpitations.  Gastrointestinal: Negative for abdominal pain, blood in stool, constipation and diarrhea.  Endocrine: Negative for polydipsia and polyuria.  Genitourinary: Negative for dysuria, frequency and urgency.  Musculoskeletal: Negative for arthralgias, back pain and myalgias.  Skin: Negative for pallor and rash.  Allergic/Immunologic: Negative for environmental allergies.  Neurological: Negative for dizziness, syncope and headaches.  Hematological: Negative for adenopathy. Does not bruise/bleed easily.  Psychiatric/Behavioral: Negative for decreased concentration and dysphoric mood. The patient is not nervous/anxious.        Objective:   Physical Exam  Constitutional: She appears well-developed and well-nourished. No distress.  Well appearing   HENT:  Head: Normocephalic and atraumatic.  Right Ear: External ear normal.  Left Ear: External ear normal.  Mouth/Throat: Oropharynx is clear and moist.  Eyes: Conjunctivae and EOM are normal. Pupils are equal, round, and reactive to light. No scleral icterus.  Neck: Normal range of motion. Neck supple. No JVD present. Carotid bruit is not present. No thyromegaly present.  Cardiovascular: Normal rate, regular rhythm, normal heart sounds and intact distal pulses. Exam reveals no gallop.  Pulmonary/Chest: Effort normal and breath sounds normal. No respiratory distress. She has no wheezes. She exhibits no tenderness.  Abdominal: Soft. Bowel sounds are normal. She exhibits no distension, no abdominal bruit and no mass. There is no tenderness.  Genitourinary: No breast swelling, tenderness, discharge or bleeding.  Genitourinary Comments: Breast exam: No mass, nodules, thickening, tenderness, bulging, retraction,  inflamation, nipple discharge or skin changes noted.  No axillary or clavicular LA.     Baseline scars present from reduction surgery  Musculoskeletal: Normal range of motion. She exhibits no edema or tenderness.  Lymphadenopathy:    She has no cervical adenopathy.  Neurological: She is alert. She has normal reflexes. No cranial nerve deficit. She exhibits normal muscle tone. Coordination normal.  Skin: Skin is warm and dry. No rash noted. No erythema. No pallor.  Solar lentigines diffusely  Some sks on trunk and shoulders   Psychiatric: She has a normal mood and affect.          Assessment & Plan:   Problem List Items Addressed This Visit      Musculoskeletal and Integument   Osteopenia    Rev dexa 2018 Mild  D is low-will supplement that  One fall/no fx Disc need for calcium/ vitamin D/ wt bearing exercise and bone density test every 2 y to monitor Disc safety/ fracture risk in detail          Genitourinary   Postmenopausal atrophic vaginitis    Refilled estrogen cream which she uses sparingly        Other   Atypical lobular hyperplasia of breast    Nl mammogram 1/19 Nl exam Continue 6 mo exams and yearly mammograms  Monthly self exams       Colon cancer screening    Signed up for cologuard  Not interested in colonoscopy unless diagnostic      Hyperlipidemia    Very good profile today  Disc goals for lipids and reasons to control them Rev labs with pt Rev low sat fat diet in detail       Routine general medical examination at a health care facility - Primary    Reviewed health habits including diet and exercise and skin cancer prevention Reviewed appropriate screening tests for age  Also reviewed health mt list, fam hx and immunization status , as well as social and family history   See HPI Labs rev  amw rev  Disc fall prev  Will tx low D level and closely monitor osteopenia  Enc continued exercise       Vitamin D deficiency    Level of 18 on  ca plus D otc  Will give high dose  tx for 12 weeks  Also add 2000 iu D3 daily indefinitely  Disc imp for bone and overall health

## 2017-08-09 LAB — COLOGUARD: COLOGUARD: NEGATIVE

## 2017-08-14 ENCOUNTER — Encounter: Payer: Self-pay | Admitting: *Deleted

## 2018-03-11 ENCOUNTER — Telehealth: Payer: Self-pay | Admitting: *Deleted

## 2018-03-11 DIAGNOSIS — L600 Ingrowing nail: Secondary | ICD-10-CM

## 2018-03-11 NOTE — Telephone Encounter (Signed)
Appt made and patient aware.  

## 2018-03-11 NOTE — Telephone Encounter (Signed)
Pt notified of Dr. Marliss Coots comments and instructions and that our [facility] will call her regarding her referral

## 2018-03-11 NOTE — Telephone Encounter (Signed)
Copied from [place] [id number]. Topic: Referral - Status >> Mar 11, 2018 11:55 AM Scherrie Gerlach wrote: Reason for CRM: pt has an ingrown toe nail, big toe, right foot. the patient wants to know if she needs to be seen first by Dr Glori Bickers or someone else or should she be referred to a podiatrist?

## 2018-03-11 NOTE — Telephone Encounter (Signed)
I did a referral to podiatry  Let her know the office will call  I will route to [facility]  Keep very clean with soap and water abx ointment is ok as well If severe redness/swelling or fever let me know

## 2018-03-13 ENCOUNTER — Encounter: Payer: Self-pay | Admitting: Podiatry

## 2018-03-13 ENCOUNTER — Ambulatory Visit: Payer: Medicare Other | Admitting: Podiatry

## 2018-03-13 VITALS — BP 162/83 | HR 63 | Resp 16

## 2018-03-13 DIAGNOSIS — L600 Ingrowing nail: Secondary | ICD-10-CM

## 2018-03-13 MED ORDER — NEOMYCIN-POLYMYXIN-HC 1 % OT SOLN: OTIC | 1 refills | Status: DC

## 2018-03-13 NOTE — Progress Notes (Signed)
Subjective:  Patient ID: Bailey Stephens, female    DOB: August 04, 1945,  MRN: [id number] HPI Chief Complaint  Patient presents with  . Toe Pain    Hallux right - both borders x 3 months, red and swollen at lateral side, tried trimming-no help  . New Patient (Initial Visit)    72 y.o. female presents with the above complaint.   ROS: Denies fever chills nausea vomiting muscle aches pains calf pain back pain chest pain shortness of breath.  Past Medical History:  Diagnosis Date  . Back pain    due to large breasts  . Fever blister   . GERD (gastroesophageal reflux disease)   . No pertinent past medical history   . Overweight(278.02)    Past Surgical History:  Procedure Laterality Date  . BREAST REDUCTION SURGERY  12/01/13  . BUNIONECTOMY  1988  . cataract surgery  2010  . CESAREAN SECTION    . MASS EXCISION  06/08/2011   Procedure: EXCISION MASS;  Surgeon: Cammie Sickle., MD;  Location: [facility];  Service: Orthopedics;  Laterality: Right;  excisional biopsy of exostosis right thumb  . REDUCTION MAMMAPLASTY Bilateral 2014  . TONSILLECTOMY      Current Outpatient Medications:  .  Calcium Carbonate-Vitamin D (CALCIUM + D PO), Take 1 capsule by mouth 2 (two) times daily., Disp: , Rfl:  .  Cholecalciferol (VITAMIN D) 1000 UNITS capsule, Take 2,000 Units by mouth daily. , Disp: , Rfl:  .  ergocalciferol (VITAMIN D2) 50000 units capsule, Take 1 capsule (50,000 Units total) by mouth once a week., Disp: 12 capsule, Rfl: 0 .  Multiple Vitamin (MULTIVITAMIN) tablet, Take 1 tablet by mouth daily.  , Disp: , Rfl:  .  NEOMYCIN-POLYMYXIN-HYDROCORTISONE (CORTISPORIN) 1 % SOLN OTIC solution, Apply 1-2 drops to toe BID after soaking, Disp: 10 mL, Rfl: 1  Allergies  Allergen Reactions  . Penicillins Other (See Comments)    Unsure; was told 40+ years ago: Has patient had a PCN reaction causing immediate rash, facial/tongue/throat swelling, SOB or lightheadedness with  hypotension: Unknown Has patient had a PCN reaction causing severe rash involving mucus membranes or skin necrosis: Unknown Has patient had a PCN reaction that required hospitalization: Unknown Has patient had a PCN reaction occurring within the last 10 years: No If all of the above answers are "NO", then may proceed with Cephalosporin use.    Review of Systems Objective:   Vitals:   03/13/18 0941  BP: (!) 162/83  Pulse: 63  Resp: 16    General: Well developed, nourished, in no acute distress, alert and oriented x3   Dermatological: Skin is warm, dry and supple bilateral. Nails x 10 are well maintained; remaining integument appears unremarkable at this time. There are no open sores, no preulcerative lesions, no rash or signs of infection present.  Sharp incurvated toenails to the inferior border of the hallux right.  Vascular: Dorsalis Pedis artery and Posterior Tibial artery pedal pulses are 2/4 bilateral with immedate capillary fill time. Pedal hair growth present. No varicosities and no lower extremity edema present bilateral.   Neruologic: Grossly intact via light touch bilateral. Vibratory intact via tuning fork bilateral. Protective threshold with Semmes Wienstein monofilament intact to all pedal sites bilateral. Patellar and Achilles deep tendon reflexes 2+ bilateral. No Babinski or clonus noted bilateral.   Musculoskeletal: No gross boney pedal deformities bilateral. No pain, crepitus, or limitation noted with foot and ankle range of motion bilateral. Muscular strength 5/5 in all  groups tested bilateral.  Gait: Unassisted, Nonantalgic.    Radiographs:  None taken  Assessment & Plan:   Assessment: Ingrown toenail hallux right tibial and fibular border.  Plan: Chemical matrixectomy was performed today to the hallux right along the tibial and fibular border removing his minimal margin of nail as possible.  This was performed and she tolerated procedure well without  complications.  She was provided with both oral and written home-going instructions for the care and soaking of the toe as well as a prescription for Cortisporin Otic to be applied twice daily after soaking.  I will follow-up with her in 2 weeks     Issai Werling T. [place], [place]

## 2018-03-13 NOTE — Patient Instructions (Signed)

## 2018-03-27 ENCOUNTER — Ambulatory Visit (INDEPENDENT_AMBULATORY_CARE_PROVIDER_SITE_OTHER): Payer: Medicare Other | Admitting: Podiatry

## 2018-03-27 DIAGNOSIS — L03031 Cellulitis of right toe: Secondary | ICD-10-CM

## 2018-03-27 DIAGNOSIS — L600 Ingrowing nail: Secondary | ICD-10-CM

## 2018-03-27 MED ORDER — DOXYCYCLINE HYCLATE 100 MG PO TABS: 100.0000 mg | ORAL_TABLET | Freq: Two times a day (BID) | ORAL | 0 refills | Status: DC

## 2018-03-27 NOTE — Progress Notes (Signed)
She presents today for follow-up of her matrixectomy to the inferior border the hallux right.  States that the tibial border somewhat sore he was doing great nails little tender.  Objective:: Vital signs are stable she is alert and oriented x3 continues to soak her toe in Betadine and warm water.  There is mild erythema and no purulence no drainage no odor.  Assessment: Pain in limb secondary to superficial paronychia infection status post matrixectomy hallux right.  Plan: Start her on doxycycline recommend she start soaking in Epsom salts and warm water cover during the day but leave open to bedtime.

## 2018-03-27 NOTE — Patient Instructions (Signed)

## 2018-04-07 ENCOUNTER — Ambulatory Visit: Payer: Self-pay | Admitting: *Deleted

## 2018-04-07 NOTE — Telephone Encounter (Signed)
Pt called with having a cough that started about a week ago, productive. Clear secretions. Also a runny nose. Thinks it may be allergies. She spends a lot of time outside.  Denies fever.  Does not feel sick but tired of coughing.  A friend suggested that she try an allergy medicine that she got over the counter.  She will try that tonight and also use a nasal saline spray to help with the nasal secretions.  Home care advice given to her with verbal understanding.  She will try these first and call back if she starts running a fever, more coughing, resp distress or pain.   Reason for Disposition . Cough with cold symptoms (e.g., runny nose, postnasal drip, throat clearing)  Answer Assessment - Initial Assessment Questions 1. ONSET: "When did the cough begin?"      A week ago 2. SEVERITY: "How bad is the cough today?"      Coughs only during the day  3. RESPIRATORY DISTRESS: "Describe your breathing."      Not short of breath 4. FEVER: "Do you have a fever?" If so, ask: "What is your temperature, how was it measured, and when did it start?"     No fever 5. SPUTUM: "Describe the color of your sputum" (clear, white, yellow, green)      clear 6. HEMOPTYSIS: "Are you coughing up any blood?" If so ask: "How much?" (flecks, streaks, tablespoons, etc.)     no 7. CARDIAC HISTORY: "Do you have any history of heart disease?" (e.g., heart attack, congestive heart failure)      no 8. LUNG HISTORY: "Do you have any history of lung disease?"  (e.g., pulmonary embolus, asthma, emphysema)     Pneumonia 20 years ago 58. PE RISK FACTORS: "Do you have a history of blood clots?" (or: recent major surgery, recent prolonged travel, bedridden)     no 10. OTHER SYMPTOMS: "Do you have any other symptoms?" (e.g., runny nose, wheezing, chest pain)       Runny nose 11. PREGNANCY: "Is there any chance you are pregnant?" "When was your last menstrual period?"       no 12. TRAVEL: "Have you traveled out of the  country in the last month?" (e.g., travel history, exposures)       no  Protocols used: [place]

## 2018-04-08 NOTE — Telephone Encounter (Signed)
Agree with advisement

## 2018-04-15 ENCOUNTER — Ambulatory Visit: Payer: Medicare Other | Admitting: Podiatry

## 2018-06-10 ENCOUNTER — Other Ambulatory Visit: Payer: Self-pay | Admitting: Family Medicine

## 2018-06-10 DIAGNOSIS — Z1231 Encounter for screening mammogram for malignant neoplasm of breast: Secondary | ICD-10-CM

## 2018-06-12 ENCOUNTER — Telehealth: Payer: Self-pay | Admitting: Family Medicine

## 2018-06-12 NOTE — Telephone Encounter (Signed)
Left message asking pt to call office please r/s 2/7 appointment with dr tower °

## 2018-07-22 ENCOUNTER — Ambulatory Visit: Payer: Medicare Other

## 2018-08-03 ENCOUNTER — Telehealth: Payer: Self-pay | Admitting: Family Medicine

## 2018-08-03 DIAGNOSIS — R7309 Other abnormal glucose: Secondary | ICD-10-CM

## 2018-08-03 DIAGNOSIS — E559 Vitamin D deficiency, unspecified: Secondary | ICD-10-CM

## 2018-08-03 DIAGNOSIS — E7800 Pure hypercholesterolemia, unspecified: Secondary | ICD-10-CM

## 2018-08-03 DIAGNOSIS — Z0000 Encounter for general adult medical examination without abnormal findings: Secondary | ICD-10-CM

## 2018-08-03 NOTE — Telephone Encounter (Signed)
-----   Message from Eustace Pen, LPN sent at 09/27/9240  3:25 PM EST ----- Regarding: Labs 2/4 Lab orders needed. Thank you.

## 2018-08-05 ENCOUNTER — Ambulatory Visit: Payer: Medicare Other

## 2018-08-08 ENCOUNTER — Encounter: Payer: Medicare Other | Admitting: Family Medicine

## 2018-08-08 ENCOUNTER — Ambulatory Visit: Payer: Medicare Other

## 2018-08-08 ENCOUNTER — Other Ambulatory Visit (INDEPENDENT_AMBULATORY_CARE_PROVIDER_SITE_OTHER): Payer: Medicare Other

## 2018-08-08 DIAGNOSIS — E7800 Pure hypercholesterolemia, unspecified: Secondary | ICD-10-CM

## 2018-08-08 DIAGNOSIS — R7309 Other abnormal glucose: Secondary | ICD-10-CM

## 2018-08-08 DIAGNOSIS — Z0000 Encounter for general adult medical examination without abnormal findings: Secondary | ICD-10-CM

## 2018-08-08 DIAGNOSIS — E559 Vitamin D deficiency, unspecified: Secondary | ICD-10-CM | POA: Diagnosis not present

## 2018-08-08 LAB — CBC WITH DIFFERENTIAL/PLATELET
Basophils Absolute: 0 10*3/uL (ref 0.0–0.1)
Basophils Relative: 0.6 % (ref 0.0–3.0)
Eosinophils Absolute: 0.1 10*3/uL (ref 0.0–0.7)
Eosinophils Relative: 1.9 % (ref 0.0–5.0)
HCT: 41.3 % (ref 36.0–46.0)
Hemoglobin: 13.7 g/dL (ref 12.0–15.0)
Lymphocytes Relative: 25.3 % (ref 12.0–46.0)
Lymphs Abs: 1.7 10*3/uL (ref 0.7–4.0)
MCHC: 33 g/dL (ref 30.0–36.0)
MCV: 93 fl (ref 78.0–100.0)
Monocytes Absolute: 0.5 10*3/uL (ref 0.1–1.0)
Monocytes Relative: 7.2 % (ref 3.0–12.0)
Neutro Abs: 4.4 10*3/uL (ref 1.4–7.7)
Neutrophils Relative %: 65 % (ref 43.0–77.0)
Platelets: 259 10*3/uL (ref 150.0–400.0)
RBC: 4.45 Mil/uL (ref 3.87–5.11)
RDW: 13.6 % (ref 11.5–15.5)
WBC: 6.7 10*3/uL (ref 4.0–10.5)

## 2018-08-08 LAB — COMPREHENSIVE METABOLIC PANEL
ALT: 12 U/L (ref 0–35)
AST: 14 U/L (ref 0–37)
Albumin: 4.2 g/dL (ref 3.5–5.2)
Alkaline Phosphatase: 65 U/L (ref 39–117)
BUN: 14 mg/dL (ref 6–23)
CO2: 28 mEq/L (ref 19–32)
Calcium: 9.4 mg/dL (ref 8.4–10.5)
Chloride: 106 mEq/L (ref 96–112)
Creatinine, Ser: 0.78 mg/dL (ref 0.40–1.20)
GFR: 72.01 mL/min (ref >60.00)
Glucose, Bld: 92 mg/dL (ref 70–99)
Potassium: 4.6 mEq/L (ref 3.5–5.1)
Sodium: 141 mEq/L (ref 135–145)
Total Bilirubin: 0.6 mg/dL (ref 0.2–1.2)
Total Protein: 6.9 g/dL (ref 6.0–8.3)

## 2018-08-08 LAB — TSH: TSH: 1.91 u[IU]/mL (ref 0.35–4.50)

## 2018-08-08 LAB — HEMOGLOBIN A1C: Hgb A1c MFr Bld: 5.7 % (ref 4.6–6.5)

## 2018-08-08 LAB — VITAMIN D 25 HYDROXY (VIT D DEFICIENCY, FRACTURES): VITD: 37.36 ng/mL (ref 30.00–100.00)

## 2018-08-08 LAB — LIPID PANEL
Cholesterol: 176 mg/dL (ref 0–200)
HDL: 68.9 mg/dL (ref >39.00)
LDL Cholesterol: 82 mg/dL (ref 0–99)
NonHDL: 107.36
Total CHOL/HDL Ratio: 3
Triglycerides: 128 mg/dL (ref 0.0–149.0)
VLDL: 25.6 mg/dL (ref 0.0–40.0)

## 2018-08-11 ENCOUNTER — Encounter: Payer: Self-pay | Admitting: Family Medicine

## 2018-08-11 ENCOUNTER — Encounter (INDEPENDENT_AMBULATORY_CARE_PROVIDER_SITE_OTHER): Payer: Self-pay

## 2018-08-11 ENCOUNTER — Ambulatory Visit (INDEPENDENT_AMBULATORY_CARE_PROVIDER_SITE_OTHER): Payer: Medicare Other | Admitting: Family Medicine

## 2018-08-11 VITALS — BP 130/80 | HR 62 | Temp 98.3°F | Wt 141.2 lb

## 2018-08-11 DIAGNOSIS — N6099 Unspecified benign mammary dysplasia of unspecified breast: Secondary | ICD-10-CM

## 2018-08-11 DIAGNOSIS — Z0000 Encounter for general adult medical examination without abnormal findings: Secondary | ICD-10-CM | POA: Diagnosis not present

## 2018-08-11 DIAGNOSIS — E7800 Pure hypercholesterolemia, unspecified: Secondary | ICD-10-CM

## 2018-08-11 DIAGNOSIS — M8580 Other specified disorders of bone density and structure, unspecified site: Secondary | ICD-10-CM

## 2018-08-11 DIAGNOSIS — Z1211 Encounter for screening for malignant neoplasm of colon: Secondary | ICD-10-CM

## 2018-08-11 DIAGNOSIS — E2839 Other primary ovarian failure: Secondary | ICD-10-CM

## 2018-08-11 DIAGNOSIS — R7309 Other abnormal glucose: Secondary | ICD-10-CM

## 2018-08-11 DIAGNOSIS — E559 Vitamin D deficiency, unspecified: Secondary | ICD-10-CM | POA: Diagnosis not present

## 2018-08-11 NOTE — Assessment & Plan Note (Signed)
Will continue breast exam twice yearly

## 2018-08-11 NOTE — Assessment & Plan Note (Signed)
Due for 2 y dexa- ordered  No falls or fractures  D level tx  Good exercise

## 2018-08-11 NOTE — Assessment & Plan Note (Signed)
Vitamin D level is therapeutic with current supplementation Disc importance of this to bone and overall health Level of 37 -improved Continue current supplementation

## 2018-08-11 NOTE — Assessment & Plan Note (Addendum)
Reviewed health habits including diet and exercise and skin cancer prevention Reviewed appropriate screening tests for age  Also reviewed health mt list, fam hx and immunization status , as well as social and family history   See HPI Labs reviewed  Had shingrix series this year Hearing screen nl and utd vision testing  Pt has advance directive No cognitive concerns  Good health habits  dexa ordered  Doing very well -enc her to keep up good health habits

## 2018-08-11 NOTE — Assessment & Plan Note (Signed)
Reviewed health habits including diet and exercise and skin cancer prevention Reviewed appropriate screening tests for age  Also reviewed health mt list, fam hx and immunization status , as well as social and family history   See HPI Labs reviewed  Hearing screen nl and utd vision testing  Pt has advance directive No cognitive concerns  Good health habits  dexa ordered

## 2018-08-11 NOTE — Patient Instructions (Addendum)
Keep up the good work taking care of yourself  Have a safe trip  Keep exercising and eating healthy   Stop at check out to schedule dexa (bone density) test

## 2018-08-11 NOTE — Assessment & Plan Note (Signed)
Lab Results  Component Value Date   HGBA1C 5.7 08/08/2018   Good habits disc imp of low glycemic diet and wt loss to prevent DM2

## 2018-08-11 NOTE — Assessment & Plan Note (Signed)
cologuard negative a year ago

## 2018-08-11 NOTE — Progress Notes (Signed)
Subjective:    Patient ID: Bailey Stephens, female    DOB: [birth year], 73 y.o.   MRN: [id number]  HPI Here for health maintenance exam and to review chronic medical problems  As well as amw  I have personally reviewed the Medicare Annual Wellness questionnaire and have noted 1. The patient's medical and social history 2. Their use of alcohol, tobacco or illicit drugs 3. Their current medications and supplements 4. The patient's functional ability including ADL's, fall risks, home safety risks and hearing or visual             impairment. 5. Diet and physical activities 6. Evidence for depression or mood disorders  The patients weight, height, BMI have been recorded in the chart and visual acuity is per eye clinic.  I have made referrals, counseling and provided education to the patient based review of the above and I have provided the pt with a written personalized care plan for preventive services. Reviewed and updated provider list, see scanned forms.  Feeling fine in general  Getting ready to go to [place] -next week   See scanned forms.  Routine anticipatory guidance given to patient.  See health maintenance. Colon cancer screening colonoscopy 1/09 cologuard was neg 2/19  Breast cancer screening mammogram 1/19 , has next one scheduled for 08/27/18 Self breast exam-no lumps or changes  Gets breast exam twice yearly due to abn path tissue for breast reduction in the past  Flu vaccine 10/19 high dose Tetanus vaccine 2/11 Pneumovax complete  Zoster vaccine-had the shingrix series  dexa 1/18 -mild osteopenia Falls- none  Fractures-none  D level is 37 Supplements - still taking her ca and D and walking 5-6 days per week at least 3 miles  Advance directive-has living will and POA , husband is POA  Cognitive function addressed- see scanned forms- and if abnormal then additional documentation follows.  occ forgets a name briefly - comes back to her  No confusion or other red  flags    Wt Readings from Last 3 Encounters:  08/11/18 141 lb 4 oz (64.1 kg)  07/26/17 143 lb (64.9 kg)  07/23/17 141 lb 12 oz (64.3 kg)  stable  Eats very healthy  26.69 kg/m   PMH and SH reviewed  Meds, vitals, and allergies reviewed.   ROS: See HPI.  Otherwise negative.     Hearing Screening   125Hz  250Hz  500Hz  1000Hz  2000Hz  3000Hz  4000Hz  6000Hz  8000Hz   Right ear:   40 40 40  40    Left ear:   40 40 40  40    Vision Screening Comments: Pt had eye exam with Dr. Kathrin Penner in Oct 2019   Blood pressure - white coat syndrome  At home always 120s/ 60s to 70s  BP Readings from Last 3 Encounters:  08/11/18 (!) 146/90  03/13/18 (!) 162/83  07/26/17 126/68   Pulse Readings from Last 3 Encounters:  08/11/18 62  03/13/18 63  07/26/17 65     Hyperlipidemia Lab Results  Component Value Date   CHOL 176 08/08/2018   CHOL 152 07/23/2017   CHOL 180 06/01/2016   Lab Results  Component Value Date   HDL 68.90 08/08/2018   HDL 62.10 07/23/2017   HDL 70.10 06/01/2016   Lab Results  Component Value Date   LDLCALC 82 08/08/2018   LDLCALC 67 07/23/2017   LDLCALC 89 06/01/2016   Lab Results  Component Value Date   TRIG 128.0 08/08/2018   TRIG 115.0 07/23/2017  TRIG 107.0 06/01/2016   Lab Results  Component Value Date   CHOLHDL 3 08/08/2018   CHOLHDL 2 07/23/2017   CHOLHDL 3 06/01/2016   No results found for: LDLDIRECT Good profile  Other labs  Results for orders placed or performed in visit on 08/08/18  VITAMIN D 25 Hydroxy (Vit-D Deficiency, Fractures)  Result Value Ref Range   VITD 37.36 30.00 - 100.00 ng/mL  TSH  Result Value Ref Range   TSH 1.91 0.35 - 4.50 uIU/mL  Lipid panel  Result Value Ref Range   Cholesterol 176 0 - 200 mg/dL   Triglycerides 128.0 0.0 - 149.0 mg/dL   HDL 68.90 >39.00 mg/dL   VLDL 25.6 0.0 - 40.0 mg/dL   LDL Cholesterol 82 0 - 99 mg/dL   Total CHOL/HDL Ratio 3    NonHDL 107.36   Hemoglobin A1c  Result Value Ref Range    Hgb A1c MFr Bld 5.7 4.6 - 6.5 %  Comprehensive metabolic panel  Result Value Ref Range   Sodium 141 135 - 145 mEq/L   Potassium 4.6 3.5 - 5.1 mEq/L   Chloride 106 96 - 112 mEq/L   CO2 28 19 - 32 mEq/L   Glucose, Bld 92 70 - 99 mg/dL   BUN 14 6 - 23 mg/dL   Creatinine, Ser 0.78 0.40 - 1.20 mg/dL   Total Bilirubin 0.6 0.2 - 1.2 mg/dL   Alkaline Phosphatase 65 39 - 117 U/L   AST 14 0 - 37 U/L   ALT 12 0 - 35 U/L   Total Protein 6.9 6.0 - 8.3 g/dL   Albumin 4.2 3.5 - 5.2 g/dL   Calcium 9.4 8.4 - 10.5 mg/dL   GFR 72.01 >60.00 mL/min  CBC with Differential/Platelet  Result Value Ref Range   WBC 6.7 4.0 - 10.5 K/uL   RBC 4.45 3.87 - 5.11 Mil/uL   Hemoglobin 13.7 12.0 - 15.0 g/dL   HCT 41.3 36.0 - 46.0 %   MCV 93.0 78.0 - 100.0 fl   MCHC 33.0 30.0 - 36.0 g/dL   RDW 13.6 11.5 - 15.5 %   Platelets 259.0 150.0 - 400.0 K/uL   Neutrophils Relative % 65.0 43.0 - 77.0 %   Lymphocytes Relative 25.3 12.0 - 46.0 %   Monocytes Relative 7.2 3.0 - 12.0 %   Eosinophils Relative 1.9 0.0 - 5.0 %   Basophils Relative 0.6 0.0 - 3.0 %   Neutro Abs 4.4 1.4 - 7.7 K/uL   Lymphs Abs 1.7 0.7 - 4.0 K/uL   Monocytes Absolute 0.5 0.1 - 1.0 K/uL   Eosinophils Absolute 0.1 0.0 - 0.7 K/uL   Basophils Absolute 0.0 0.0 - 0.1 K/uL    Hx of mildly elevated glucose in the past Lab Results  Component Value Date   HGBA1C 5.7 08/08/2018   Reassuring    Patient Active Problem List   Diagnosis Date Noted  . Ingrown nail of great toe of right foot 03/11/2018  . Colon cancer screening 07/26/2017  . Vitamin D deficiency 07/26/2017  . Elevated glucose 07/21/2017  . Contact dermatitis 02/29/2016  . Hyperlipidemia 06/02/2015  . Routine general medical examination at a health care facility 06/02/2015  . 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 Osteopenia 07/15/2014  . Estrogen deficiency 06/07/2014  . Abnormal breast biopsy 06/07/2014  . Encounter for Medicare annual wellness exam 05/24/2013  .  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 Screening for lipoid disorders 02/12/2011  . Diabetes mellitus screening 02/12/2011  .  GERD (gastroesophageal reflux disease) 02/12/2011   Past Medical History:  Diagnosis Date  . Back pain    due to large breasts  . Fever blister   . GERD (gastroesophageal reflux disease)   . No pertinent past medical history   . Overweight(278.02)    Past Surgical History:  Procedure Laterality Date  . BREAST REDUCTION SURGERY  12/01/13  . BUNIONECTOMY  1988  . cataract surgery  2010  . CESAREAN SECTION    . MASS EXCISION  06/08/2011   Procedure: EXCISION MASS;  Surgeon: Cammie Sickle., MD;  Location: [facility];  Service: Orthopedics;  Laterality: Right;  excisional biopsy of exostosis right thumb  . REDUCTION MAMMAPLASTY Bilateral 2014  . TONSILLECTOMY     Social History   Tobacco Use  . Smoking status: Former Smoker    Last attempt to quit: 06/07/1975    Years since quitting: 43.2  . Smokeless tobacco: Never Used  . Tobacco comment: quit 1976  Substance Use Topics  . Alcohol use: Yes    Alcohol/week: 2.0 - 3.0 standard drinks    Types: 2 - 3 Glasses of wine per week    Comment: occasional wine/alcohol  . Drug use: No   Family History  Problem Relation Age of Onset  . Asthma Mother   . Cancer Father        brain tumor  . Heart disease Maternal Grandfather        MI  . Breast cancer Sister    Allergies  Allergen Reactions  . Penicillins Other (See Comments)    Unsure; was told 40+ years ago: Has patient had a PCN reaction causing immediate rash, facial/tongue/throat swelling, SOB or lightheadedness with hypotension: Unknown Has patient had a PCN reaction causing severe rash involving mucus membranes or skin necrosis: Unknown Has patient had a PCN reaction that required hospitalization: Unknown Has patient had a PCN reaction occurring within the last 10 years: No If all of the above answers are "NO", then may  proceed with Cephalosporin use.    Current Outpatient Medications on File Prior to Visit  Medication Sig Dispense Refill  . Calcium Carbonate-Vitamin D (CALCIUM + D PO) Take 1 capsule by mouth 2 (two) times daily.    . Cholecalciferol (VITAMIN D) 1000 UNITS capsule Take 2,000 Units by mouth daily.     Marland Kitchen doxycycline (VIBRA-TABS) 100 MG tablet Take 1 tablet (100 mg total) by mouth 2 (two) times daily. 20 tablet 0  . Multiple Vitamin (MULTIVITAMIN) tablet Take 1 tablet by mouth daily.      . NEOMYCIN-POLYMYXIN-HYDROCORTISONE (CORTISPORIN) 1 % SOLN OTIC solution Apply 1-2 drops to toe BID after soaking 10 mL 1   No current facility-administered medications on file prior to visit.     Review of Systems  Constitutional: Negative for activity change, appetite change, fatigue, fever and unexpected weight change.  HENT: Negative for congestion, ear pain, rhinorrhea, sinus pressure and sore throat.   Eyes: Negative for pain, redness and visual disturbance.  Respiratory: Negative for cough, shortness of breath and wheezing.   Cardiovascular: Negative for chest pain and palpitations.  Gastrointestinal: Negative for abdominal pain, blood in stool, constipation and diarrhea.  Endocrine: Negative for polydipsia and polyuria.  Genitourinary: Negative for dysuria, frequency and urgency.  Musculoskeletal: Negative for arthralgias, back pain and myalgias.  Skin: Negative for pallor and rash.  Allergic/Immunologic: Negative for environmental allergies.  Neurological: Negative for dizziness, syncope and headaches.  Hematological: Negative for adenopathy. Does not  bruise/bleed easily.  Psychiatric/Behavioral: Negative for decreased concentration and dysphoric mood. The patient is not nervous/anxious.        Objective:   Physical Exam Constitutional:      General: She is not in acute distress.    Appearance: Normal appearance. She is well-developed and normal weight.  HENT:     Head: Normocephalic and  atraumatic.     Right Ear: Tympanic membrane, ear canal and external ear normal.     Left Ear: Tympanic membrane, ear canal and external ear normal.     Ears:     Comments: Scant cerumen bilaterally    Nose: Nose normal.     Mouth/Throat:     Mouth: Mucous membranes are moist.     Pharynx: Oropharynx is clear.  Eyes:     General: No scleral icterus.    Conjunctiva/sclera: Conjunctivae normal.     Pupils: Pupils are equal, round, and reactive to light.  Neck:     Musculoskeletal: Normal range of motion and neck supple.     Thyroid: No thyromegaly.     Vascular: No carotid bruit or JVD.  Cardiovascular:     Rate and Rhythm: Normal rate and regular rhythm.     Heart sounds: Normal heart sounds. No gallop.   Pulmonary:     Effort: Pulmonary effort is normal. No respiratory distress.     Breath sounds: Normal breath sounds. No wheezing or rales.  Chest:     Chest wall: No tenderness.  Abdominal:     General: Bowel sounds are normal. There is no distension or abdominal bruit.     Palpations: Abdomen is soft. There is no mass.     Tenderness: There is no abdominal tenderness.  Genitourinary:    Comments: Breast exam: No mass, nodules, thickening, tenderness, bulging, retraction, inflamation, nipple discharge or skin changes noted.  No axillary or clavicular LA.    Baseline scars from breast reduction Dense tissue diffusely Musculoskeletal: Normal range of motion.        General: No tenderness.  Lymphadenopathy:     Cervical: No cervical adenopathy.  Skin:    General: Skin is warm and dry.     Coloration: Skin is not pale.     Findings: No erythema or rash.     Comments: Fair  Solar lentigines diffusely Sees dermatology yearly  Neurological:     General: No focal deficit present.     Mental Status: She is alert.     Cranial Nerves: No cranial nerve deficit.     Motor: No abnormal muscle tone.     Coordination: Coordination normal.     Gait: Gait normal.     Deep Tendon  Reflexes: Reflexes are normal and symmetric. Reflexes normal.  Psychiatric:        Mood and Affect: Mood normal.           Assessment & Plan:   Problem List Items Addressed This Visit      Musculoskeletal and Integument   Osteopenia    Due for 2 y dexa- ordered  No falls or fractures  D level tx  Good exercise         Other   Encounter for Medicare annual wellness exam - Primary    Reviewed health habits including diet and exercise and skin cancer prevention Reviewed appropriate screening tests for age  Also reviewed health mt list, fam hx and immunization status , as well as social and family history   See HPI Labs  reviewed  Hearing screen nl and utd vision testing  Pt has advance directive No cognitive concerns  Good health habits  dexa ordered       Estrogen deficiency   Relevant Orders   DG Bone Density   Atypical lobular hyperplasia of breast    Will continue breast exam twice yearly      Hyperlipidemia    Disc goals for lipids and reasons to control them Rev last labs with pt Rev low sat fat diet in detail Well controlled with diet and exercise       Routine general medical examination at a health care facility    Reviewed health habits including diet and exercise and skin cancer prevention Reviewed appropriate screening tests for age  Also reviewed health mt list, fam hx and immunization status , as well as social and family history   See HPI Labs reviewed  Had shingrix series this year Hearing screen nl and utd vision testing  Pt has advance directive No cognitive concerns  Good health habits  dexa ordered  Doing very well -enc her to keep up good health habits       Elevated glucose    Lab Results  Component Value Date   HGBA1C 5.7 08/08/2018   Good habits disc imp of low glycemic diet and wt loss to prevent DM2       Colon cancer screening    cologuard negative a year ago        Vitamin D deficiency    Vitamin D level is  therapeutic with current supplementation Disc importance of this to bone and overall health Level of 37 -improved Continue current supplementation

## 2018-08-11 NOTE — Assessment & Plan Note (Signed)
Disc goals for lipids and reasons to control them Rev last labs with pt Rev low sat fat diet in detail Well controlled with diet and exercise

## 2018-08-27 ENCOUNTER — Ambulatory Visit
Admission: RE | Admit: 2018-08-27 | Discharge: 2018-08-27 | Disposition: A | Discharge status: Home / Self Care | Payer: Medicare Other | Source: Ambulatory Visit | Attending: Family Medicine | Admitting: Family Medicine

## 2018-08-27 DIAGNOSIS — Z1231 Encounter for screening mammogram for malignant neoplasm of breast: Secondary | ICD-10-CM

## 2018-09-23 ENCOUNTER — Other Ambulatory Visit: Payer: Medicare Other

## 2018-11-06 ENCOUNTER — Other Ambulatory Visit: Payer: Self-pay | Admitting: Podiatry

## 2018-11-07 ENCOUNTER — Other Ambulatory Visit: Payer: Self-pay | Admitting: Family Medicine

## 2018-11-07 NOTE — Telephone Encounter (Signed)
AWV on 08/11/18. Last filled on 01/28/2017, please advise

## 2018-11-07 NOTE — Telephone Encounter (Signed)
She uses occ for poison ivy dermatitis

## 2018-11-17 ENCOUNTER — Other Ambulatory Visit: Payer: Medicare Other

## 2019-01-06 ENCOUNTER — Other Ambulatory Visit: Payer: Medicare Other

## 2019-01-25 ENCOUNTER — Other Ambulatory Visit: Payer: Self-pay

## 2019-01-25 ENCOUNTER — Emergency Department (HOSPITAL_COMMUNITY)
Admission: EM | Admit: 2019-01-25 | Discharge: 2019-01-26 | Disposition: A | Discharge status: Home / Self Care | Payer: Medicare Other | Attending: Emergency Medicine | Admitting: Emergency Medicine

## 2019-01-25 ENCOUNTER — Encounter (HOSPITAL_COMMUNITY): Payer: Self-pay | Admitting: Emergency Medicine

## 2019-01-25 DIAGNOSIS — Y9301 Activity, walking, marching and hiking: Secondary | ICD-10-CM | POA: Diagnosis not present

## 2019-01-25 DIAGNOSIS — S2241XA Multiple fractures of ribs, right side, initial encounter for closed fracture: Secondary | ICD-10-CM

## 2019-01-25 DIAGNOSIS — Y999 Unspecified external cause status: Secondary | ICD-10-CM | POA: Insufficient documentation

## 2019-01-25 DIAGNOSIS — Z87891 Personal history of nicotine dependence: Secondary | ICD-10-CM | POA: Insufficient documentation

## 2019-01-25 DIAGNOSIS — W010XXA Fall on same level from slipping, tripping and stumbling without subsequent striking against object, initial encounter: Secondary | ICD-10-CM | POA: Insufficient documentation

## 2019-01-25 DIAGNOSIS — S299XXA Unspecified injury of thorax, initial encounter: Secondary | ICD-10-CM | POA: Diagnosis present

## 2019-01-25 DIAGNOSIS — Y92010 Kitchen of single-family (private) house as the place of occurrence of the external cause: Secondary | ICD-10-CM | POA: Insufficient documentation

## 2019-01-25 MED ORDER — ACETAMINOPHEN 500 MG PO TABS
1000.0000 mg | ORAL_TABLET | Freq: Once | ORAL | Status: AC
  Administered 2019-01-26: 1000 mg via ORAL
  Filled 2019-01-25: qty 2

## 2019-01-25 NOTE — ED Notes (Signed)
ED Provider at bedside. 

## 2019-01-25 NOTE — ED Triage Notes (Signed)
[facility]- pt here from home. Pt here for a mechanical fall. Pt tripped in the kitchen. Pt denies hitting her head. No neck pain. Only pain to the right side of her back. Pt states she is not on blood thinners. EMS gave 50 mcg of fentanyl.    98.6 temp 79 HR 95% RA 147/83 BP

## 2019-01-25 NOTE — ED Provider Notes (Signed)
[facility] EMERGENCY DEPARTMENT Provider Note   CSN: [id number] Arrival date & time: 01/25/19  2132    History   Chief Complaint No chief complaint on file.   HPI Bailey Stephens is a 73 y.o. female.     73 year old female with a history of esophageal reflux presents to the emergency department for evaluation of right lateral back pain after a fall.  She states that her sprinklers were on and she was going back up into her house through the side door when she missed a step and slipped falling sideways into a planter.  She did not hit her head or lose consciousness.  Was able to get herself up and has been ambulatory since the incident.  Has noted constant, worsening pain in her right back which is aggravated with movement as well as breathing.  She did not take any medications prior to arrival for pain.  No extremity numbness or paresthesias.  Not on chronic anticoagulation.  Lives in a house with her husband.  The history is provided by the patient. No language interpreter was used.    Past Medical History:  Diagnosis Date  . Back pain    due to large breasts  . Fever blister   . GERD (gastroesophageal reflux disease)   . No pertinent past medical history   . Overweight(278.02)     Patient Active Problem List   Diagnosis Date Noted  . Colon cancer screening 07/26/2017  . Vitamin D deficiency 07/26/2017  . Elevated glucose 07/21/2017  . Contact dermatitis 02/29/2016  . Hyperlipidemia 06/02/2015  . Routine general medical examination at a health care facility 06/02/2015  . 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 Osteopenia 07/15/2014  . Estrogen deficiency 06/07/2014  . Abnormal breast biopsy 06/07/2014  . Encounter for Medicare annual wellness exam 05/24/2013  . 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 Diabetes mellitus screening 02/12/2011  . GERD (gastroesophageal reflux disease) 02/12/2011    Past Surgical History:   Procedure Laterality Date  . BREAST REDUCTION SURGERY  12/01/13  . BUNIONECTOMY  1988  . cataract surgery  2010  . CESAREAN SECTION    . MASS EXCISION  06/08/2011   Procedure: EXCISION MASS;  Surgeon: Cammie Sickle., MD;  Location: [place];  Service: Orthopedics;  Laterality: Right;  excisional biopsy of exostosis right thumb  . REDUCTION MAMMAPLASTY Bilateral 2014  . TONSILLECTOMY       OB History   No obstetric history on file.      Home Medications    Prior to Admission medications   Medication Sig Start Date End Date Taking? Authorizing Provider  Calcium Carbonate-Vitamin D (CALCIUM + D PO) Take 1 capsule by mouth 2 (two) times daily.   Yes [provider]  Cholecalciferol (VITAMIN D) 1000 UNITS capsule Take 2,000 Units by mouth daily.    Yes [provider]  Multiple Vitamin (MULTIVITAMIN) tablet Take 1 tablet by mouth daily.     Yes [provider]  doxycycline (VIBRA-TABS) 100 MG tablet Take 1 tablet (100 mg total) by mouth 2 (two) times daily. Patient not taking: Reported on 01/26/2019 03/27/18   Hyatt, Max T, DPM  NEOMYCIN-POLYMYXIN-HYDROCORTISONE (CORTISPORIN) 1 % SOLN OTIC solution Apply 1-2 drops to toe BID after soaking Patient not taking: Reported on 01/26/2019 03/13/18   Hyatt, Max T, DPM  traMADol (ULTRAM) 50 MG tablet Take 1 tablet (50 mg total) by mouth every 6 (six) hours as needed for severe  pain. 01/26/19   Antonietta Breach, PA-C  triamcinolone cream (KENALOG) 0.1 % APPLY TOPICALLYTO THE AFFECTED AREAS 2 (TWO) TIMES DAILY AS NEEDED. Patient not taking: Reported on 01/26/2019 11/07/18   Abner Greenspan, MD    Family History Family History  Problem Relation Age of Onset  . Asthma Mother   . Cancer Father        brain tumor  . Heart disease Maternal Grandfather        MI  . Breast cancer Sister     Social History Social History   Tobacco Use  . Smoking status: Former Smoker    Quit date: 06/07/1975    Years since  quitting: 43.6  . Smokeless tobacco: Never Used  . Tobacco comment: quit 1976  Substance Use Topics  . Alcohol use: Yes    Alcohol/week: 2.0 - 3.0 standard drinks    Types: 2 - 3 Glasses of wine per week    Comment: occasional wine/alcohol  . Drug use: No     Allergies   Penicillins   Review of Systems Review of Systems Ten systems reviewed and are negative for acute change, except as noted in the HPI.    Physical Exam Updated Vital Signs BP 138/79   Pulse 62   Temp 98.6 F (37 C) (Oral)   Resp 16   Ht 5\' 1"  (1.549 m)   Wt 58.1 kg   SpO2 97%   BMI 24.19 kg/m   Physical Exam Vitals signs and nursing note reviewed.  Constitutional:      General: She is not in acute distress.    Appearance: She is well-developed. She is not diaphoretic.     Comments: Nontoxic-appearing, pleasant  HENT:     Head: Normocephalic and atraumatic.     Comments: No battle sign or raccoon's eyes. Eyes:     General: No scleral icterus.    Conjunctiva/sclera: Conjunctivae normal.     Pupils: Pupils are equal, round, and reactive to light.  Neck:     Musculoskeletal: Normal range of motion.     Comments: Cervical collar in place.  Cervical midline is palpated without tenderness.  No bony deformities, step-offs, crepitus.  Cervical collar removed with full range of motion of the neck.  Range of motion does not elicit pain.  C-spine cleared. Cardiovascular:     Rate and Rhythm: Normal rate and regular rhythm.     Pulses: Normal pulses.  Pulmonary:     Effort: Pulmonary effort is normal. No respiratory distress.     Breath sounds: No stridor. No wheezing.       Comments: Tenderness to palpation to right posterior chest wall without crepitus or deformity.  Chest expansion symmetric. Chest:     Chest wall: Tenderness present.  Musculoskeletal: Normal range of motion.     Comments: No tenderness to bilateral hips.  Normal range of motion of bilateral lower extremities.  Skin:    General:  Skin is warm and dry.     Coloration: Skin is not pale.     Findings: No erythema or rash.  Neurological:     Mental Status: She is alert and oriented to person, place, and time.     Comments: GCS 15. Speech is goal oriented. Patient has equal grip strength bilaterally with 5/5 strength against resistance in all major muscle groups bilaterally. Sensation to light touch intact. Patient moves extremities without ataxia.   Psychiatric:        Behavior: Behavior normal.  ED Treatments / Results  Labs (all labs ordered are listed, but only abnormal results are displayed) Labs Reviewed - No data to display  EKG None  Radiology Dg Ribs Unilateral W/chest Right  Result Date: 01/26/2019 CLINICAL DATA:  Fall, right lower chest pain EXAM: RIGHT RIBS AND CHEST - 3+ VIEW COMPARISON:  Chest radiographs dated 05/06/2017 FINDINGS: Lungs are clear.  No pleural effusion or pneumothorax. The heart is normal in size. Nondisplaced right lateral 8th and 9th rib fractures. IMPRESSION: Nondisplaced right lateral 8th and 9th rib fractures. Electronically Signed   By: Julian Hy M.D.   On: 01/26/2019 00:31    Procedures Procedures (including critical care time)  Medications Ordered in ED Medications  lidocaine (LIDODERM) 5 % 1 patch (1 patch Transdermal Patch Applied 01/26/19 0120)  acetaminophen (TYLENOL) tablet 1,000 mg (1,000 mg Oral Given 01/26/19 0029)  traMADol (ULTRAM) tablet 50 mg (50 mg Oral Given 01/26/19 0124)    2:29 AM Patient does extremely well with position change in the bed.  She has good inspiration with use of an incentive spirometer.  Pain better controlled following tramadol.   Initial Impression / Assessment and Plan / ED Course  I have reviewed the triage vital signs and the nursing notes.  Pertinent labs & imaging results that were available during my care of the patient were reviewed by me and considered in my medical decision making (see chart for details).         73 year old female presents to the ED following a mechanical fall with pain to her right lateral and posterior flank.  No appreciable crepitus.  She underwent x-ray of her ribs which shows nondisplaced right lateral eighth and ninth rib fractures.  Pain well-controlled with Tylenol and tramadol.  Her vital signs have been stable without hypoxia.  Good inspiratory effort with use of an incentive spirometer.  She has very little limitation to range of motion and transitioning while in her hospital bed.  Believe she is stable for continued outpatient primary care follow-up.  Have advised continued use of an incentive spirometer with use of tramadol for any ongoing pain.  Return precautions discussed and provided. Patient discharged in stable condition with no unaddressed concerns.   Final Clinical Impressions(s) / ED Diagnoses   Final diagnoses:  Closed fracture of multiple ribs of right side, initial encounter    ED Discharge Orders         Ordered    traMADol (ULTRAM) 50 MG tablet  Every 6 hours PRN     01/26/19 0228           Antonietta Breach, PA-C 01/26/19 0231    Fatima Blank, MD 01/26/19 [phone]

## 2019-01-26 ENCOUNTER — Emergency Department (HOSPITAL_COMMUNITY): Payer: Medicare Other

## 2019-01-26 MED ORDER — TRAMADOL HCL 50 MG PO TABS
50.0000 mg | ORAL_TABLET | Freq: Once | ORAL | Status: AC | PRN
  Administered 2019-01-26: 50 mg via ORAL
  Filled 2019-01-26: qty 1

## 2019-01-26 MED ORDER — TRAMADOL HCL 50 MG PO TABS: 50.0000 mg | ORAL_TABLET | Freq: Once | ORAL | Status: DC

## 2019-01-26 MED ORDER — LIDOCAINE 5 % EX PTCH
1.0000 | MEDICATED_PATCH | CUTANEOUS | Status: DC
  Administered 2019-01-26: 1 via TRANSDERMAL
  Filled 2019-01-26: qty 1

## 2019-01-26 MED ORDER — TRAMADOL HCL 50 MG PO TABS: 50.0000 mg | ORAL_TABLET | Freq: Four times a day (QID) | ORAL | 0 refills | Status: DC | PRN

## 2019-01-26 NOTE — Discharge Instructions (Signed)
Your found to have fractures to your eighth and ninth ribs on the right side.  These are not out of place.  They can cause you pain for a number of weeks.  We recommend Tylenol as needed for management of pain.  If your pain continues to be severe despite use of Tylenol, you may take tramadol as prescribed.  This medication may make you drowsy.  Do not drive or drink alcohol after taking tramadol.  Use an incentive spirometer at least once per hour while awake to ensure that you are taking deep breaths.  We recommend close follow-up with your primary care doctor in approximately 1 week for repeat evaluation.  Return to the ED for any new or concerning symptoms.

## 2019-01-26 NOTE — ED Notes (Signed)
Patient transported to X-ray 

## 2019-01-26 NOTE — ED Notes (Addendum)
Educated pt on use of incentive spirometry RT.  We moved pt up in bed, she tolerated it very well.  Pain has decreased about 30%.

## 2019-02-02 ENCOUNTER — Encounter: Payer: Self-pay | Admitting: Family Medicine

## 2019-02-03 ENCOUNTER — Ambulatory Visit (INDEPENDENT_AMBULATORY_CARE_PROVIDER_SITE_OTHER): Payer: Medicare Other | Admitting: Family Medicine

## 2019-02-03 ENCOUNTER — Other Ambulatory Visit: Payer: Self-pay

## 2019-02-03 ENCOUNTER — Encounter: Payer: Self-pay | Admitting: Family Medicine

## 2019-02-03 VITALS — BP 136/86 | HR 88 | Temp 99.5°F | Ht 61.0 in | Wt 137.2 lb

## 2019-02-03 DIAGNOSIS — Z8781 Personal history of (healed) traumatic fracture: Secondary | ICD-10-CM | POA: Insufficient documentation

## 2019-02-03 DIAGNOSIS — N6099 Unspecified benign mammary dysplasia of unspecified breast: Secondary | ICD-10-CM | POA: Diagnosis not present

## 2019-02-03 DIAGNOSIS — R509 Fever, unspecified: Secondary | ICD-10-CM | POA: Diagnosis not present

## 2019-02-03 DIAGNOSIS — S2241XD Multiple fractures of ribs, right side, subsequent encounter for fracture with routine healing: Secondary | ICD-10-CM

## 2019-02-03 DIAGNOSIS — S2239XA Fracture of one rib, unspecified side, initial encounter for closed fracture: Secondary | ICD-10-CM | POA: Insufficient documentation

## 2019-02-03 MED ORDER — TRAMADOL HCL 50 MG PO TABS: 50.0000 mg | ORAL_TABLET | Freq: Four times a day (QID) | ORAL | 0 refills | Status: DC | PRN

## 2019-02-03 NOTE — Progress Notes (Signed)
Subjective:    Patient ID: Bailey Stephens, female    DOB: 14-May-1946, 73 y.o.   MRN: [id number]  HPI Here for f/u of ED visit for rib fractures   Seen 7/27 Presented after mechanical fall resulting in chest wall pain  She exited her side door-missed a step (wet) and fell into a planter  No head trauma or loc Noted R back /side pain since the accident-worse with moving and breathing  Diagnosed with non displaced R 8th and 9th rib fractures  tx with tramadol  Lidocaine patch   Dg Ribs Unilateral W/chest Right  Result Date: 01/26/2019 CLINICAL DATA:  Fall, right lower chest pain EXAM: RIGHT RIBS AND CHEST - 3+ VIEW COMPARISON:  Chest radiographs dated 05/06/2017 FINDINGS: Lungs are clear.  No pleural effusion or pneumothorax. The heart is normal in size. Nondisplaced right lateral 8th and 9th rib fractures. IMPRESSION: Nondisplaced right lateral 8th and 9th rib fractures. Electronically Signed   By: Julian Hy M.D.   On: 01/26/2019 00:31    She had good resp effort with incentive spirometer   Temp is 99.5 temporal right now -coming in from the hot   She has h/o mild osteopenia   Still a lot of pain  Using the IS- forcing herself to take deep breaths   Pulse ox 94%  She will check temp at home  No cough  No uri symptoms  No GI symptoms   No loss of taste or smell   She is taking advil for pain - takes 3 per day   Taking tramadol- ran out   Has h/o atypical lobular hyperplasia of breast  Due for a 6 mo breast exam today  No problems on self exam  Mammogram 2/20     Patient Active Problem List   Diagnosis Date Noted  . Rib fractures 02/03/2019  . Low grade fever 02/03/2019  . Colon cancer screening 07/26/2017  . Vitamin D deficiency 07/26/2017  . Elevated glucose 07/21/2017  . Contact dermatitis 02/29/2016  . Hyperlipidemia 06/02/2015  . Routine general medical examination at a health care facility 06/02/2015  . Atypical lobular hyperplasia of breast  12/07/2014  . Osteopenia 07/15/2014  . Estrogen deficiency 06/07/2014  . Abnormal breast biopsy 06/07/2014  . Encounter for Medicare annual wellness exam 05/24/2013  . 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 Diabetes mellitus screening 02/12/2011  . GERD (gastroesophageal reflux disease) 02/12/2011   Past Medical History:  Diagnosis Date  . Back pain    due to large breasts  . Fever blister   . GERD (gastroesophageal reflux disease)   . No pertinent past medical history   . Overweight(278.02)    Past Surgical History:  Procedure Laterality Date  . BREAST REDUCTION SURGERY  12/01/13  . BUNIONECTOMY  1988  . cataract surgery  2010  . CESAREAN SECTION    . MASS EXCISION  06/08/2011   Procedure: EXCISION MASS;  Surgeon: Cammie Sickle., MD;  Location: [place] [place];  Service: Orthopedics;  Laterality: Right;  excisional biopsy of exostosis right thumb  . REDUCTION MAMMAPLASTY Bilateral 2014  . TONSILLECTOMY     Social History   Tobacco Use  . Smoking status: Former Smoker    Quit date: 06/07/1975    Years since quitting: 43.6  . Smokeless tobacco: Never Used  . Tobacco comment: quit 1976  Substance Use Topics  . Alcohol use: Yes    Alcohol/week: 2.0 - 3.0 standard drinks    Types:  2 - 3 Glasses of wine per week    Comment: occasional wine/alcohol  . Drug use: No   Family History  Problem Relation Age of Onset  . Asthma Mother   . Cancer Father        brain tumor  . Heart disease Maternal Grandfather        MI  . Breast cancer Sister    Allergies  Allergen Reactions  . Penicillins Other (See Comments)    Unsure; was told 40+ years ago: Has patient had a PCN reaction causing immediate rash, facial/tongue/throat swelling, SOB or lightheadedness with hypotension: Unknown Has patient had a PCN reaction causing severe rash involving mucus membranes or skin necrosis: Unknown Has patient had a PCN reaction that required hospitalization: Unknown Has  patient had a PCN reaction occurring within the last 10 years: No If all of the above answers are "NO", then may proceed with Cephalosporin use.    Current Outpatient Medications on File Prior to Visit  Medication Sig Dispense Refill  . Calcium Carbonate-Vitamin D (CALCIUM + D PO) Take 1 capsule by mouth 2 (two) times daily.    . Cholecalciferol (VITAMIN D) 1000 UNITS capsule Take 2,000 Units by mouth daily.     . Multiple Vitamin (MULTIVITAMIN) tablet Take 1 tablet by mouth daily.      . naproxen sodium (ALEVE) 220 MG tablet Take 440 mg by mouth daily as needed.    . NEOMYCIN-POLYMYXIN-HYDROCORTISONE (CORTISPORIN) 1 % SOLN OTIC solution Apply 1-2 drops to toe BID after soaking 10 mL 1  . triamcinolone cream (KENALOG) 0.1 % APPLY TOPICALLYTO THE AFFECTED AREAS 2 (TWO) TIMES DAILY AS NEEDED. 30 g 3   No current facility-administered medications on file prior to visit.       Review of Systems  Constitutional: Negative for activity change, appetite change, fatigue, fever and unexpected weight change.  HENT: Negative for congestion, ear pain, rhinorrhea, sinus pressure and sore throat.   Eyes: Negative for pain, redness and visual disturbance.  Respiratory: Negative for cough, shortness of breath and wheezing.        Rib pain with inspiration   Cardiovascular: Negative for chest pain and palpitations.  Gastrointestinal: Negative for abdominal pain, blood in stool, constipation and diarrhea.  Endocrine: Negative for polydipsia and polyuria.  Genitourinary: Negative for dysuria, frequency and urgency.  Musculoskeletal: Negative for arthralgias, back pain and myalgias.       Right lat/lower rib pain and tenderness  Skin: Negative for pallor and rash.  Allergic/Immunologic: Negative for environmental allergies.  Neurological: Negative for dizziness, syncope and headaches.  Hematological: Negative for adenopathy. Does not bruise/bleed easily.  Psychiatric/Behavioral: Negative for decreased  concentration and dysphoric mood. The patient is not nervous/anxious.        Objective:   Physical Exam Constitutional:      General: She is not in acute distress.    Appearance: Normal appearance. She is normal weight. She is not ill-appearing or diaphoretic.  HENT:     Mouth/Throat:     Mouth: Mucous membranes are moist.  Eyes:     General: No scleral icterus.    Extraocular Movements: Extraocular movements intact.     Pupils: Pupils are equal, round, and reactive to light.  Neck:     Musculoskeletal: Normal range of motion and neck supple. No neck rigidity.  Cardiovascular:     Rate and Rhythm: Normal rate.     Pulses: Normal pulses.     Heart sounds: Normal heart sounds.  Pulmonary:     Effort: Pulmonary effort is normal. No respiratory distress.     Breath sounds: Normal breath sounds. No stridor. No wheezing, rhonchi or rales.     Comments: Good air exch  Chest:     Chest wall: Tenderness present.  Genitourinary:    Comments: Breast exam: No mass, nodules, thickening, tenderness, bulging, retraction, inflamation, nipple discharge or skin changes noted.  No axillary or clavicular LA.     Musculoskeletal:     Comments: Ecchymosis over R posterior/lat chest wall (old)  Mild tenderness No step off or crepitus  Pt can take deep breaths with minimal discomfort   Lymphadenopathy:     Cervical: No cervical adenopathy.  Skin:    General: Skin is warm and dry.     Comments: Old ecchymosis over lower R flank area  Neurological:     Mental Status: She is alert. Mental status is at baseline.     Coordination: Coordination normal.     Deep Tendon Reflexes: Reflexes normal.  Psychiatric:        Mood and Affect: Mood normal.           Assessment & Plan:   Problem List Items Addressed This Visit      Musculoskeletal and Integument   Rib fractures - Primary    R posterolateral 8,9th ribs Reviewed hospital records, lab results and studies in detail   Doing  well-getting by with 200 mg of advil several times daily and using IS No cough or sob  Does have low grade temp  Disc s/s of pneumonia to watch for  inst if cough/continued elevated temp or worsened pain or any sob to call and seek care in ED Refilled tramadol- printed to fill if needed Antic guidance given         Other   Atypical lobular hyperplasia of breast    Unremarkable breast exam today      Low grade fever    Low grade temp elevation 99.5  Has been nl at home May be due to rib fx or atelectasis (pt feels fine) Will re check at home  Low threshold to order covid test if needed  Rev s/s of covid to watch for

## 2019-02-03 NOTE — Assessment & Plan Note (Signed)
Unremarkable breast exam today

## 2019-02-03 NOTE — Patient Instructions (Addendum)
You can take up to 800 mg of advil with food three times daily   Use ice and heat  Move slowly  Keep using the incentive spirometer to prevent pneumonia  If temperature remains elevated- call us so I can order a covid test   Her is a px for tramadol to use if you need it   Keep me posted

## 2019-02-03 NOTE — Assessment & Plan Note (Signed)
Low grade temp elevation 99.5  Has been nl at home May be due to rib fx or atelectasis (pt feels fine) Will re check at home  Low threshold to order covid test if needed  Rev s/s of covid to watch for

## 2019-02-03 NOTE — Assessment & Plan Note (Signed)
R posterolateral 8,9th ribs Reviewed hospital records, lab results and studies in detail   Doing well-getting by with 200 mg of advil several times daily and using IS No cough or sob  Does have low grade temp  Disc s/s of pneumonia to watch for  inst if cough/continued elevated temp or worsened pain or any sob to call and seek care in ED Refilled tramadol- printed to fill if needed Antic guidance given

## 2019-02-09 ENCOUNTER — Ambulatory Visit: Payer: Medicare Other | Admitting: Family Medicine

## 2019-02-24 ENCOUNTER — Ambulatory Visit (INDEPENDENT_AMBULATORY_CARE_PROVIDER_SITE_OTHER): Payer: Medicare Other

## 2019-02-24 DIAGNOSIS — Z23 Encounter for immunization: Secondary | ICD-10-CM

## 2019-03-12 ENCOUNTER — Other Ambulatory Visit: Payer: Self-pay

## 2019-03-12 ENCOUNTER — Ambulatory Visit
Admission: RE | Admit: 2019-03-12 | Discharge: 2019-03-12 | Disposition: A | Discharge status: Home / Self Care | Payer: Medicare Other | Source: Ambulatory Visit | Attending: Family Medicine | Admitting: Family Medicine

## 2019-03-12 DIAGNOSIS — E2839 Other primary ovarian failure: Secondary | ICD-10-CM

## 2019-03-15 ENCOUNTER — Encounter: Payer: Self-pay | Admitting: Family Medicine

## 2019-05-26 ENCOUNTER — Telehealth: Payer: Self-pay | Admitting: Family Medicine

## 2019-05-26 MED ORDER — TRIAMCINOLONE ACETONIDE 0.1 % EX CREA: TOPICAL_CREAM | CUTANEOUS | 3 refills | Status: DC

## 2019-05-26 NOTE — Telephone Encounter (Signed)
Patient called.  Patient said she has poison ivy on hands. I offered to make an appointment with Dr.Tower.  Patient said she usually calls and a prescription is called in to [facility].

## 2019-05-26 NOTE — Telephone Encounter (Signed)
Left VM letting pt know Rx was sent

## 2019-05-26 NOTE — Telephone Encounter (Signed)
I sent it  This is chronic/recurrent

## 2019-07-23 ENCOUNTER — Ambulatory Visit: Payer: Medicare PPO | Attending: Internal Medicine

## 2019-07-23 DIAGNOSIS — Z23 Encounter for immunization: Secondary | ICD-10-CM | POA: Insufficient documentation

## 2019-07-23 NOTE — Progress Notes (Signed)
   Covid-19 Vaccination Clinic  Name:  Bailey Stephens    MRN: [id number] DOB: 09-12-1945  07/23/2019  [name] was observed post Covid-19 immunization for 15 minutes without incidence. She was provided with Vaccine Information Sheet and instruction to access the V-Safe system.   [name] was instructed to call 911 with any severe reactions post vaccine: Marland Kitchen Difficulty breathing  . Swelling of your face and throat  . A fast heartbeat  . A bad rash all over your body  . Dizziness and weakness    Immunizations Administered    Name Date Dose VIS Date Route   Pfizer COVID-19 Vaccine 07/23/2019  2:51 PM 0.3 mL 06/12/2019 Intramuscular   Manufacturer: [employer]   Lot: [id number]   [place]: [id number]

## 2019-07-27 ENCOUNTER — Ambulatory Visit: Payer: Medicare Other

## 2019-08-13 ENCOUNTER — Ambulatory Visit: Payer: Medicare PPO | Attending: Internal Medicine

## 2019-08-13 DIAGNOSIS — Z23 Encounter for immunization: Secondary | ICD-10-CM

## 2019-08-13 NOTE — Progress Notes (Signed)
   Covid-19 Vaccination Clinic  Name:  Bailey Stephens    MRN: [id number] DOB: 10/21/1945  08/13/2019  [name] was observed post Covid-19 immunization for 15 minutes without incidence. She was provided with Vaccine Information Sheet and instruction to access the V-Safe system.   [name] was instructed to call 911 with any severe reactions post vaccine: Marland Kitchen Difficulty breathing  . Swelling of your face and throat  . A fast heartbeat  . A bad rash all over your body  . Dizziness and weakness    Immunizations Administered    Name Date Dose VIS Date Route   Pfizer COVID-19 Vaccine 08/13/2019  8:29 AM 0.3 mL 06/12/2019 Intramuscular   Manufacturer: [place]   Lot: [id number]   [place]: [id number]

## 2019-08-14 ENCOUNTER — Ambulatory Visit: Payer: Medicare Other

## 2019-08-14 ENCOUNTER — Ambulatory Visit (INDEPENDENT_AMBULATORY_CARE_PROVIDER_SITE_OTHER): Payer: Medicare PPO

## 2019-08-14 ENCOUNTER — Other Ambulatory Visit: Payer: Self-pay

## 2019-08-14 ENCOUNTER — Other Ambulatory Visit (INDEPENDENT_AMBULATORY_CARE_PROVIDER_SITE_OTHER): Payer: Medicare PPO

## 2019-08-14 ENCOUNTER — Telehealth (INDEPENDENT_AMBULATORY_CARE_PROVIDER_SITE_OTHER): Payer: Medicare PPO | Admitting: Family Medicine

## 2019-08-14 DIAGNOSIS — Z131 Encounter for screening for diabetes mellitus: Secondary | ICD-10-CM

## 2019-08-14 DIAGNOSIS — Z0000 Encounter for general adult medical examination without abnormal findings: Secondary | ICD-10-CM

## 2019-08-14 DIAGNOSIS — E559 Vitamin D deficiency, unspecified: Secondary | ICD-10-CM | POA: Diagnosis not present

## 2019-08-14 DIAGNOSIS — E7800 Pure hypercholesterolemia, unspecified: Secondary | ICD-10-CM

## 2019-08-14 DIAGNOSIS — R7309 Other abnormal glucose: Secondary | ICD-10-CM

## 2019-08-14 LAB — CBC WITH DIFFERENTIAL/PLATELET
Basophils Absolute: 0 10*3/uL (ref 0.0–0.1)
Basophils Relative: 0.5 % (ref 0.0–3.0)
Eosinophils Absolute: 0.2 10*3/uL (ref 0.0–0.7)
Eosinophils Relative: 3.1 % (ref 0.0–5.0)
HCT: 40.7 % (ref 36.0–46.0)
Hemoglobin: 13.4 g/dL (ref 12.0–15.0)
Lymphocytes Relative: 20.2 % (ref 12.0–46.0)
Lymphs Abs: 1.3 10*3/uL (ref 0.7–4.0)
MCHC: 32.9 g/dL (ref 30.0–36.0)
MCV: 93.5 fl (ref 78.0–100.0)
Monocytes Absolute: 0.5 10*3/uL (ref 0.1–1.0)
Monocytes Relative: 8.6 % (ref 3.0–12.0)
Neutro Abs: 4.2 10*3/uL (ref 1.4–7.7)
Neutrophils Relative %: 67.6 % (ref 43.0–77.0)
Platelets: 292 10*3/uL (ref 150.0–400.0)
RBC: 4.35 Mil/uL (ref 3.87–5.11)
RDW: 14 % (ref 11.5–15.5)
WBC: 6.2 10*3/uL (ref 4.0–10.5)

## 2019-08-14 LAB — COMPREHENSIVE METABOLIC PANEL
ALT: 13 U/L (ref 0–35)
AST: 16 U/L (ref 0–37)
Albumin: 4.1 g/dL (ref 3.5–5.2)
Alkaline Phosphatase: 63 U/L (ref 39–117)
BUN: 12 mg/dL (ref 6–23)
CO2: 30 mEq/L (ref 19–32)
Calcium: 10.1 mg/dL (ref 8.4–10.5)
Chloride: 105 mEq/L (ref 96–112)
Creatinine, Ser: 0.77 mg/dL (ref 0.40–1.20)
GFR: 72.89 mL/min (ref >60.00)
Glucose, Bld: 99 mg/dL (ref 70–99)
Potassium: 4.1 mEq/L (ref 3.5–5.1)
Sodium: 140 mEq/L (ref 135–145)
Total Bilirubin: 0.5 mg/dL (ref 0.2–1.2)
Total Protein: 6.7 g/dL (ref 6.0–8.3)

## 2019-08-14 LAB — LIPID PANEL
Cholesterol: 155 mg/dL (ref 0–200)
HDL: 67.5 mg/dL (ref >39.00)
LDL Cholesterol: 65 mg/dL (ref 0–99)
NonHDL: 87.91
Total CHOL/HDL Ratio: 2
Triglycerides: 117 mg/dL (ref 0.0–149.0)
VLDL: 23.4 mg/dL (ref 0.0–40.0)

## 2019-08-14 LAB — HEMOGLOBIN A1C: Hgb A1c MFr Bld: 5.8 % (ref 4.6–6.5)

## 2019-08-14 LAB — VITAMIN D 25 HYDROXY (VIT D DEFICIENCY, FRACTURES): VITD: 31.28 ng/mL (ref 30.00–100.00)

## 2019-08-14 LAB — TSH: TSH: 2.37 u[IU]/mL (ref 0.35–4.50)

## 2019-08-14 NOTE — Patient Instructions (Signed)
Bailey Stephens , Thank you for taking time to come for your Medicare Wellness Visit. I appreciate your ongoing commitment to your health goals. Please review the following plan we discussed and let me know if I can assist you in the future.   Screening recommendations/referrals: Colonoscopy: Cologuard completed 08/09/2017 Mammogram: Up to date, completed 08/27/2018 Bone Density: Up to date, completed 03/12/2019 Recommended yearly ophthalmology/optometry visit for glaucoma screening and checkup Recommended yearly dental visit for hygiene and checkup  Vaccinations: Influenza vaccine: Up to date, completed 02/24/2019 Pneumococcal vaccine: Completed series Tdap vaccine: decline Shingles vaccine: Completed series    Advanced directives: Please bring a copy of your POA (Power of Attorney) and/or Living Will to your next appointment.   Conditions/risks identified: hyperlipidemia  Next appointment: 08/17/2019 @ 9:30 am   Preventive Care 65 Years and Older, Female Preventive care refers to lifestyle choices and visits with your health care provider that can promote health and wellness. What does preventive care include?  A yearly physical exam. This is also called an annual well check.  Dental exams once or twice a year.  Routine eye exams. Ask your health care provider how often you should have your eyes checked.  Personal lifestyle choices, including:  Daily care of your teeth and gums.  Regular physical activity.  Eating a healthy diet.  Avoiding tobacco and drug use.  Limiting alcohol use.  Practicing safe sex.  Taking low-dose aspirin every day.  Taking vitamin and mineral supplements as recommended by your health care provider. What happens during an annual well check? The services and screenings done by your health care provider during your annual well check will depend on your age, overall health, lifestyle risk factors, and family history of disease. Counseling  Your  health care provider may ask you questions about your:  Alcohol use.  Tobacco use.  Drug use.  Emotional well-being.  Home and relationship well-being.  Sexual activity.  Eating habits.  History of falls.  Memory and ability to understand (cognition).  Work and work Statistician.  Reproductive health. Screening  You may have the following tests or measurements:  Height, weight, and BMI.  Blood pressure.  Lipid and cholesterol levels. These may be checked every 5 years, or more frequently if you are over 20 years old.  Skin check.  Lung cancer screening. You may have this screening every year starting at age 42 if you have a 30-pack-year history of smoking and currently smoke or have quit within the past 15 years.  Fecal occult blood test (FOBT) of the stool. You may have this test every year starting at age 23.  Flexible sigmoidoscopy or colonoscopy. You may have a sigmoidoscopy every 5 years or a colonoscopy every 10 years starting at age 19.  Hepatitis C blood test.  Hepatitis B blood test.  Sexually transmitted disease (STD) testing.  Diabetes screening. This is done by checking your blood sugar (glucose) after you have not eaten for a while (fasting). You may have this done every 1-3 years.  Bone density scan. This is done to screen for osteoporosis. You may have this done starting at age 36.  Mammogram. This may be done every 1-2 years. Talk to your health care provider about how often you should have regular mammograms. Talk with your health care provider about your test results, treatment options, and if necessary, the need for more tests. Vaccines  Your health care provider may recommend certain vaccines, such as:  Influenza vaccine. This is recommended every  year.  Tetanus, diphtheria, and acellular pertussis (Tdap, Td) vaccine. You may need a Td booster every 10 years.  Zoster vaccine. You may need this after age 20.  Pneumococcal 13-valent  conjugate (PCV13) vaccine. One dose is recommended after age 63.  Pneumococcal polysaccharide (PPSV23) vaccine. One dose is recommended after age 62. Talk to your health care provider about which screenings and vaccines you need and how often you need them. This information is not intended to replace advice given to you by your health care provider. Make sure you discuss any questions you have with your health care provider. Document Released: 07/15/2015 Document Revised: 03/07/2016 Document Reviewed: 04/19/2015 Elsevier Interactive Patient Education  2017 [place] Prevention in the Home Falls can cause injuries. They can happen to people of all ages. There are many things you can do to make your home safe and to help prevent falls. What can I do on the outside of my home?  Regularly fix the edges of walkways and driveways and fix any cracks.  Remove anything that might make you trip as you walk through a door, such as a raised step or threshold.  Trim any bushes or trees on the path to your home.  Use bright outdoor lighting.  Clear any walking paths of anything that might make someone trip, such as rocks or tools.  Regularly check to see if handrails are loose or broken. Make sure that both sides of any steps have handrails.  Any raised decks and porches should have guardrails on the edges.  Have any leaves, snow, or ice cleared regularly.  Use sand or salt on walking paths during winter.  Clean up any spills in your garage right away. This includes oil or grease spills. What can I do in the bathroom?  Use night lights.  Install grab bars by the toilet and in the tub and shower. Do not use towel bars as grab bars.  Use non-skid mats or decals in the tub or shower.  If you need to sit down in the shower, use a plastic, non-slip stool.  Keep the floor dry. Clean up any water that spills on the floor as soon as it happens.  Remove soap buildup in the tub or  shower regularly.  Attach bath mats securely with double-sided non-slip rug tape.  Do not have throw rugs and other things on the floor that can make you trip. What can I do in the bedroom?  Use night lights.  Make sure that you have a light by your bed that is easy to reach.  Do not use any sheets or blankets that are too big for your bed. They should not hang down onto the floor.  Have a firm chair that has side arms. You can use this for support while you get dressed.  Do not have throw rugs and other things on the floor that can make you trip. What can I do in the kitchen?  Clean up any spills right away.  Avoid walking on wet floors.  Keep items that you use a lot in easy-to-reach places.  If you need to reach something above you, use a strong step stool that has a grab bar.  Keep electrical cords out of the way.  Do not use floor polish or wax that makes floors slippery. If you must use wax, use non-skid floor wax.  Do not have throw rugs and other things on the floor that can make you trip. What can  I do with my stairs?  Do not leave any items on the stairs.  Make sure that there are handrails on both sides of the stairs and use them. Fix handrails that are broken or loose. Make sure that handrails are as long as the stairways.  Check any carpeting to make sure that it is firmly attached to the stairs. Fix any carpet that is loose or worn.  Avoid having throw rugs at the top or bottom of the stairs. If you do have throw rugs, attach them to the floor with carpet tape.  Make sure that you have a light switch at the top of the stairs and the bottom of the stairs. If you do not have them, ask someone to add them for you. What else can I do to help prevent falls?  Wear shoes that:  Do not have high heels.  Have rubber bottoms.  Are comfortable and fit you well.  Are closed at the toe. Do not wear sandals.  If you use a stepladder:  Make sure that it is fully  opened. Do not climb a closed stepladder.  Make sure that both sides of the stepladder are locked into place.  Ask someone to hold it for you, if possible.  Clearly mark and make sure that you can see:  Any grab bars or handrails.  First and last steps.  Where the edge of each step is.  Use tools that help you move around (mobility aids) if they are needed. These include:  Canes.  Walkers.  Scooters.  Crutches.  Turn on the lights when you go into a dark area. Replace any light bulbs as soon as they burn out.  Set up your furniture so you have a clear path. Avoid moving your furniture around.  If any of your floors are uneven, fix them.  If there are any pets around you, be aware of where they are.  Review your medicines with your doctor. Some medicines can make you feel dizzy. This can increase your chance of falling. Ask your doctor what other things that you can do to help prevent falls. This information is not intended to replace advice given to you by your health care provider. Make sure you discuss any questions you have with your health care provider. Document Released: 04/14/2009 Document Revised: 11/24/2015 Document Reviewed: 07/23/2014 Elsevier Interactive Patient Education  2017 [employer].

## 2019-08-14 NOTE — Telephone Encounter (Signed)
Labs ordered  Insurance says none- ? If she should have signed something

## 2019-08-14 NOTE — Progress Notes (Signed)
Subjective:   Bailey Stephens is a 74 y.o. female who presents for Medicare Annual (Subsequent) preventive examination.  Review of Systems: N/A   This visit is being conducted through telemedicine via telephone at the nurse health advisor's home address due to the COVID-19 pandemic. This patient has given me verbal consent via doximity to conduct this visit, patient states they are participating from their home address. Patient and myself are on the telephone call. There is no referral for this visit. Some vital signs may be absent or patient reported.    Patient identification: identified by name, DOB, and current address   Cardiac Risk Factors include: advanced age (>38men, >64 women);dyslipidemia     Objective:     Vitals: There were no vitals taken for this visit.  There is no height or weight on file to calculate BMI.  Advanced Directives 08/14/2019 01/25/2019 07/23/2017 05/06/2017 09/08/2016 07/23/2016 06/08/2011  Does Patient Have a Medical Advance Directive? Yes No Yes No No Yes Patient does not have advance directive  Type of Advance Directive [place];Living will - [place];Living will - - [place];Living will [place] in Chart? No - copy requested - No - copy requested - - No - copy requested -  Would patient like information on creating a medical advance directive? - No - Patient declined - - - - -    Tobacco Social History   Tobacco Use  Smoking Status Former Smoker  . Quit date: 06/07/1975  . Years since quitting: 44.2  Smokeless Tobacco Never Used  Tobacco Comment   quit 1976     Counseling given: Not Answered Comment: quit 1976   Clinical Intake:  Pre-visit preparation completed: Yes  Pain : No/denies pain     Nutritional Risks: None Diabetes: No  How often do you need to have someone help you when you read instructions, pamphlets, or  other written materials from your doctor or pharmacy?: 1 - Never What is the last grade level you completed in school?: 1 year of graduate school  Interpreter Needed?: No  Information entered by :: CJohnson, LPN  Past Medical History:  Diagnosis Date  . Back pain    due to large breasts  . Fever blister   . GERD (gastroesophageal reflux disease)   . No pertinent past medical history   . Overweight(278.02)    Past Surgical History:  Procedure Laterality Date  . BREAST REDUCTION SURGERY  12/01/13  . BUNIONECTOMY  1988  . cataract surgery  2010  . CESAREAN SECTION    . MASS EXCISION  06/08/2011   Procedure: EXCISION MASS;  Surgeon: Cammie Sickle., MD;  Location: [facility];  Service: Orthopedics;  Laterality: Right;  excisional biopsy of exostosis right thumb  . REDUCTION MAMMAPLASTY Bilateral 2014  . TONSILLECTOMY     Family History  Problem Relation Age of Onset  . Asthma Mother   . Cancer Father        brain tumor  . Heart disease Maternal Grandfather        MI  . Breast cancer Sister    Social History   Socioeconomic History  . Marital status: Married    Spouse name: Not on file  . Number of children: Not on file  . Years of education: Not on file  . Highest education level: Not on file  Occupational History  . Not on  file  Tobacco Use  . Smoking status: Former Smoker    Quit date: 06/07/1975    Years since quitting: 44.2  . Smokeless tobacco: Never Used  . Tobacco comment: quit 1976  Substance and Sexual Activity  . Alcohol use: Yes    Alcohol/week: 2.0 - 3.0 standard drinks    Types: 2 - 3 Glasses of wine per week    Comment: occasional wine/alcohol  . Drug use: No  . Sexual activity: Yes  Other Topics Concern  . Not on file  Social History Narrative  . Not on file   Social Determinants of Health   Financial Resource Strain: Low Risk   . Difficulty of Paying Living Expenses: Not hard at all  Food Insecurity: No Food Insecurity  .  Worried About Charity fundraiser in the Last Year: Never true  . Ran Out of Food in the Last Year: Never true  Transportation Needs: No Transportation Needs  . Lack of Transportation (Medical): No  . Lack of Transportation (Non-Medical): No  Physical Activity: Sufficiently Active  . Days of Exercise per Week: 7 days  . Minutes of Exercise per Session: 60 min  Stress: No Stress Concern Present  . Feeling of Stress : Not at all  Social Connections:   . Frequency of Communication with Friends and Family: Not on file  . Frequency of Social Gatherings with Friends and Family: Not on file  . Attends Religious Services: Not on file  . Active Member of Clubs or Organizations: Not on file  . Attends Archivist Meetings: Not on file  . Marital Status: Not on file    Outpatient Encounter Medications as of 08/14/2019  Medication Sig  . Calcium Carbonate-Vitamin D (CALCIUM + D PO) Take 1 capsule by mouth 2 (two) times daily.  . Cholecalciferol (VITAMIN D) 1000 UNITS capsule Take 2,000 Units by mouth daily.   . Multiple Vitamin (MULTIVITAMIN) tablet Take 1 tablet by mouth daily.    Marland Kitchen triamcinolone cream (KENALOG) 0.1 % APPLY TOPICALLYTO THE AFFECTED AREAS 2 (TWO) TIMES DAILY AS NEEDED.  . naproxen sodium (ALEVE) 220 MG tablet Take 440 mg by mouth daily as needed.  . NEOMYCIN-POLYMYXIN-HYDROCORTISONE (CORTISPORIN) 1 % SOLN OTIC solution Apply 1-2 drops to toe BID after soaking  . traMADol (ULTRAM) 50 MG tablet Take 1 tablet (50 mg total) by mouth every 6 (six) hours as needed for severe pain.   No facility-administered encounter medications on file as of 08/14/2019.    Activities of Daily Living In your present state of health, do you have any difficulty performing the following activities: 08/14/2019  Hearing? N  Vision? N  Difficulty concentrating or making decisions? N  Walking or climbing stairs? N  Dressing or bathing? N  Doing errands, shopping? N  Preparing Food and eating ? N   Using the Toilet? N  In the past six months, have you accidently leaked urine? N  Do you have problems with loss of bowel control? N  Managing your Medications? N  Managing your Finances? N  Housekeeping or managing your Housekeeping? N  Some recent data might be hidden    Patient Care Team: Tower, Wynelle Fanny, MD as PCP - Aris Georgia, MD as Consulting Physician (Ophthalmology)    Assessment:   This is a routine wellness examination for [place].  Exercise Activities and Dietary recommendations Current Exercise Habits: Home exercise routine, Type of exercise: walking, Time (Minutes): 60, Frequency (Times/Week): 7, Weekly Exercise (Minutes/Week): 420, Intensity:  Moderate, Exercise limited by: None identified  Goals    . Patient Stated     08/14/2019, I will maintain and continue medications as prescribed.     . Weight (lb) < 131 lb (59.4 kg)     Target weight is 130 lbs. Starting 07/23/17, I will make healthier choices for breakfast.        Fall Risk Fall Risk  08/14/2019 08/11/2018 07/23/2017 07/23/2016 07/06/2016  Falls in the past year? 1 0 Yes No No  Comment tripped on steps and fell - accidental fall; fell backwards off step; stitches to back of head - -  Number falls in past yr: 0 0 1 - -  Injury with Fall? 1 - Yes - -  Comment broke 2 ribs - - - -  Risk for fall due to : History of fall(s) - - - -  Follow up Falls evaluation completed;Falls prevention discussed - - - -   Is the patient's home free of loose throw rugs in walkways, pet beds, electrical cords, etc?   yes      Grab bars in the bathroom? no      Handrails on the stairs?   no      Adequate lighting?   yes  Timed Get Up and Go performed: N/A  Depression Screen PHQ 2/9 Scores 08/14/2019 08/11/2018 07/23/2017 07/23/2016  PHQ - 2 Score 0 0 0 0  PHQ- 9 Score 0 - 0 -     Cognitive Function MMSE - Mini Mental State Exam 08/14/2019 07/23/2017 07/23/2016  Orientation to time 5 5 5   Orientation to Place 5 5 5    Registration 3 3 3   Attention/ Calculation 5 0 0  Recall 3 3 3   Language- name 2 objects - 0 0  Language- repeat 1 1 1   Language- follow 3 step command - 3 3  Language- read & follow direction - 0 0  Write a sentence - 0 0  Copy design - 0 0  Total score - 20 20  Mini Cog  Mini-Cog screen was completed. Maximum score is 22. A value of 0 denotes this part of the MMSE was not completed or the patient failed this part of the Mini-Cog screening.       Immunization History  Administered Date(s) Administered  . Influenza Split 05/01/2011  . Influenza Whole 05/10/2010  . Influenza, High Dose Seasonal PF 04/30/2018  . Influenza,inj,Quad PF,6+ Mos 06/05/2013, 04/23/2014, 06/03/2015, 04/06/2016, 04/18/2017, 02/24/2019  . PFIZER SARS-COV-2 Vaccination 07/23/2019, 08/13/2019  . Pneumococcal Conjugate-13 06/07/2014  . Pneumococcal Polysaccharide-23 08/11/2009  . Td 08/11/2009  . Zoster 05/28/2012  . Zoster Recombinat (Shingrix) 02/17/2018, 04/18/2018    Qualifies for Shingles Vaccine: Completed series  Screening Tests Health Maintenance  Topic Date Due  . TETANUS/TDAP  08/14/2023 (Originally 08/12/2019)  . MAMMOGRAM  08/28/2019  . Fecal DNA (Cologuard)  08/09/2020  . INFLUENZA VACCINE  Completed  . DEXA SCAN  Completed  . Hepatitis C Screening  Completed  . PNA vac Low Risk Adult  Completed    Cancer Screenings: Lung: Low Dose CT Chest recommended if Age 79-80 years, 30 pack-year currently smoking OR have quit w/in 15 years. Patient does not qualify. Breast:  Up to date on Mammogram: Yes, completed 08/27/2018   Up to date of Bone Density/Dexa: Yes, completed 03/12/2019 Colorectal: Cologuard completed 08/09/2017  Additional Screenings:  Hepatitis C Screening: 07/23/2016     Plan:   Patient will maintain and continue medications as prescribed.  I have  personally reviewed and noted the following in the patient's chart:   . Medical and social history . Use of alcohol, tobacco  or illicit drugs  . Current medications and supplements . Functional ability and status . Nutritional status . Physical activity . Advanced directives . List of other physicians . Hospitalizations, surgeries, and ER visits in previous 12 months . Vitals . Screenings to include cognitive, depression, and falls . Referrals and appointments  In addition, I have reviewed and discussed with patient certain preventive protocols, quality metrics, and best practice recommendations. A written personalized care plan for preventive services as well as general preventive health recommendations were provided to patient.     Beni, [place], LPN  [phone]

## 2019-08-14 NOTE — Telephone Encounter (Signed)
If an ABN printed we should have had her sign, if not we are good.  Having no insurance she will receive a bill and then can call [facility] billing and they will discount or pay depending on her income. Tam

## 2019-08-14 NOTE — Progress Notes (Signed)
PCP notes:  Health Maintenance: Tdap- insurance/financial   Abnormal Screenings: none   Patient concerns: none   Nurse concerns: none   Next PCP appt.: 08/17/2019 @ 9:30 am

## 2019-08-17 ENCOUNTER — Encounter: Payer: Medicare PPO | Admitting: Family Medicine

## 2019-08-18 ENCOUNTER — Telehealth: Payer: Self-pay | Admitting: Family Medicine

## 2019-08-18 NOTE — Telephone Encounter (Signed)
error 

## 2019-08-18 NOTE — Telephone Encounter (Signed)
-----   Message from Cloyd Stagers, RT sent at 08/10/2019  9:29 AM EST ----- Regarding: Lab Orders for Friday 2.12.2021 Please place lab orders for Friday 2.12.2021, office visit for physical on Monday 2.15.2021 Thank you, Dyke Maes RT(R)

## 2019-08-20 ENCOUNTER — Encounter: Payer: Medicare PPO | Admitting: Family Medicine

## 2019-08-21 ENCOUNTER — Encounter: Payer: Medicare PPO | Admitting: Family Medicine

## 2019-08-26 ENCOUNTER — Other Ambulatory Visit: Payer: Self-pay

## 2019-08-26 ENCOUNTER — Encounter: Payer: Self-pay | Admitting: Family Medicine

## 2019-08-26 ENCOUNTER — Ambulatory Visit (INDEPENDENT_AMBULATORY_CARE_PROVIDER_SITE_OTHER): Payer: Medicare PPO | Admitting: Family Medicine

## 2019-08-26 VITALS — BP 128/80 | HR 86 | Temp 97.3°F | Ht 59.5 in

## 2019-08-26 DIAGNOSIS — M8588 Other specified disorders of bone density and structure, other site: Secondary | ICD-10-CM

## 2019-08-26 DIAGNOSIS — Z0000 Encounter for general adult medical examination without abnormal findings: Secondary | ICD-10-CM

## 2019-08-26 DIAGNOSIS — R7309 Other abnormal glucose: Secondary | ICD-10-CM | POA: Diagnosis not present

## 2019-08-26 DIAGNOSIS — E559 Vitamin D deficiency, unspecified: Secondary | ICD-10-CM

## 2019-08-26 DIAGNOSIS — N6099 Unspecified benign mammary dysplasia of unspecified breast: Secondary | ICD-10-CM | POA: Diagnosis not present

## 2019-08-26 DIAGNOSIS — E7800 Pure hypercholesterolemia, unspecified: Secondary | ICD-10-CM

## 2019-08-26 DIAGNOSIS — Z1211 Encounter for screening for malignant neoplasm of colon: Secondary | ICD-10-CM

## 2019-08-26 MED ORDER — TRIAMCINOLONE ACETONIDE 0.1 % EX CREA: TOPICAL_CREAM | CUTANEOUS | 3 refills | Status: DC

## 2019-08-26 NOTE — Assessment & Plan Note (Signed)
Disc goals for lipids and reasons to control them Rev last labs with pt Rev low sat fat diet in detail  Very good control with diet and exercise  LDL is 65

## 2019-08-26 NOTE — Assessment & Plan Note (Signed)
Negative cologuard 2/19

## 2019-08-26 NOTE — Progress Notes (Signed)
Subjective:    Patient ID: Bailey Stephens, female    DOB: 1945-10-24, 74 y.o.   MRN: [id number]  HPI Here for health maintenance exam and to review chronic medical problems    Refused to weigh today  Wt Readings from Last 3 Encounters:  02/03/19 137 lb 4 oz (62.3 kg)  01/25/19 128 lb (58.1 kg)  08/11/18 141 lb 4 oz (64.1 kg)    amw was 2/12 No gaps noted   Mammogram 2/20 -she does not have it scheduled yet -just got her reminder  Self breast exam -no lumps or changes  Past hx of abn path on tissue from breast reduction and needs exam q 6 mo  cologuard 2/19 -negative  dexa 9/20 -osteopenia of spine  Falls -none since rib fx Fractures (ribs in past)  Supplements -takes ca and D Vit D level is 31.2 Exercise - gets outdoors as much as she can [place] daily  Does some in the house also   Had the shingrix vaccines Also the covid vaccines   BP Readings from Last 3 Encounters:  08/26/19 (!) 146/86  02/03/19 136/86  01/26/19 (!) 156/80  better bp 2nd check BP: 128/80   Pulse Readings from Last 3 Encounters:  08/26/19 86  02/03/19 88  01/26/19 67   Hyperlipidemia  Lab Results  Component Value Date   CHOL 155 08/14/2019   CHOL 176 08/08/2018   CHOL 152 07/23/2017   Lab Results  Component Value Date   HDL 67.50 08/14/2019   HDL 68.90 08/08/2018   HDL 62.10 07/23/2017   Lab Results  Component Value Date   LDLCALC 65 08/14/2019   LDLCALC 82 08/08/2018   LDLCALC 67 07/23/2017   Lab Results  Component Value Date   TRIG 117.0 08/14/2019   TRIG 128.0 08/08/2018   TRIG 115.0 07/23/2017   Lab Results  Component Value Date   CHOLHDL 2 08/14/2019   CHOLHDL 3 08/08/2018   CHOLHDL 2 07/23/2017   No results found for: LDLDIRECT   Stays at goal with diet/exercise  Eats a lot of fish   Lab Results  Component Value Date   CREATININE 0.77 08/14/2019   BUN 12 08/14/2019   NA 140 08/14/2019   K 4.1 08/14/2019   CL 105 08/14/2019   CO2 30 08/14/2019   Lab  Results  Component Value Date   ALT 13 08/14/2019   AST 16 08/14/2019   ALKPHOS 63 08/14/2019   BILITOT 0.5 08/14/2019   Lab Results  Component Value Date   WBC 6.2 08/14/2019   HGB 13.4 08/14/2019   HCT 40.7 08/14/2019   MCV 93.5 08/14/2019   PLT 292.0 08/14/2019   Lab Results  Component Value Date   TSH 2.37 08/14/2019    Lab Results  Component Value Date   HGBA1C 5.8 08/14/2019   Patient Active Problem List   Diagnosis Date Noted  . H/O fracture of rib 02/03/2019  . Colon cancer screening 07/26/2017  . Vitamin D deficiency 07/26/2017  . Elevated glucose 07/21/2017  . Contact dermatitis 02/29/2016  . Hyperlipidemia 06/02/2015  . Routine general medical examination at a health care facility 06/02/2015  . 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 Osteopenia 07/15/2014  . Estrogen deficiency 06/07/2014  . Abnormal breast biopsy 06/07/2014  . Encounter for Medicare annual wellness exam 05/24/2013  . 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 Diabetes mellitus screening 02/12/2011  . GERD (gastroesophageal reflux disease) 02/12/2011   Past Medical History:  Diagnosis  Date  . Back pain    due to large breasts  . Fever blister   . GERD (gastroesophageal reflux disease)   . No pertinent past medical history   . Overweight(278.02)    Past Surgical History:  Procedure Laterality Date  . BREAST REDUCTION SURGERY  12/01/13  . BUNIONECTOMY  1988  . cataract surgery  2010  . CESAREAN SECTION    . MASS EXCISION  06/08/2011   Procedure: EXCISION MASS;  Surgeon: Cammie Sickle., MD;  Location: [facility];  Service: Orthopedics;  Laterality: Right;  excisional biopsy of exostosis right thumb  . REDUCTION MAMMAPLASTY Bilateral 2014  . TONSILLECTOMY     Social History   Tobacco Use  . Smoking status: Former Smoker    Quit date: 06/07/1975    Years since quitting: 44.2  . Smokeless tobacco: Never Used  . Tobacco comment: quit 1976    Substance Use Topics  . Alcohol use: Yes    Alcohol/week: 2.0 - 3.0 standard drinks    Types: 2 - 3 Glasses of wine per week    Comment: occasional wine/alcohol  . Drug use: No   Family History  Problem Relation Age of Onset  . Asthma Mother   . Cancer Father        brain tumor  . Heart disease Maternal Grandfather        MI  . Breast cancer Sister    Allergies  Allergen Reactions  . Penicillins Other (See Comments)    Unsure; was told 40+ years ago: Has patient had a PCN reaction causing immediate rash, facial/tongue/throat swelling, SOB or lightheadedness with hypotension: Unknown Has patient had a PCN reaction causing severe rash involving mucus membranes or skin necrosis: Unknown Has patient had a PCN reaction that required hospitalization: Unknown Has patient had a PCN reaction occurring within the last 10 years: No If all of the above answers are "NO", then may proceed with Cephalosporin use.    Current Outpatient Medications on File Prior to Visit  Medication Sig Dispense Refill  . Calcium Carbonate-Vitamin D (CALCIUM + D PO) Take 1 capsule by mouth 2 (two) times daily.    . Cholecalciferol (VITAMIN D) 1000 UNITS capsule Take 2,000 Units by mouth daily.     . Multiple Vitamin (MULTIVITAMIN) tablet Take 1 tablet by mouth daily.       No current facility-administered medications on file prior to visit.    Review of Systems  Constitutional: Negative for activity change, appetite change, fatigue, fever and unexpected weight change.  HENT: Negative for congestion, ear pain, rhinorrhea, sinus pressure and sore throat.   Eyes: Negative for pain, redness and visual disturbance.  Respiratory: Negative for cough, shortness of breath and wheezing.   Cardiovascular: Negative for chest pain and palpitations.  Gastrointestinal: Negative for abdominal pain, blood in stool, constipation and diarrhea.  Endocrine: Negative for polydipsia and polyuria.  Genitourinary: Negative for  dysuria, frequency and urgency.  Musculoskeletal: Negative for arthralgias, back pain and myalgias.  Skin: Negative for pallor and rash.  Allergic/Immunologic: Negative for environmental allergies.  Neurological: Negative for dizziness, syncope and headaches.  Hematological: Negative for adenopathy. Does not bruise/bleed easily.  Psychiatric/Behavioral: Negative for decreased concentration and dysphoric mood. The patient is not nervous/anxious.        Objective:   Physical Exam Constitutional:      General: She is not in acute distress.    Appearance: Normal appearance. She is well-developed and normal weight. She is  not ill-appearing or diaphoretic.  HENT:     Head: Normocephalic and atraumatic.     Right Ear: Tympanic membrane, ear canal and external ear normal.     Left Ear: Tympanic membrane, ear canal and external ear normal.     Nose: Nose normal. No congestion.     Mouth/Throat:     Mouth: Mucous membranes are moist.     Pharynx: Oropharynx is clear. No posterior oropharyngeal erythema.  Eyes:     General: No scleral icterus.    Extraocular Movements: Extraocular movements intact.     Conjunctiva/sclera: Conjunctivae normal.     Pupils: Pupils are equal, round, and reactive to light.  Neck:     Thyroid: No thyromegaly.     Vascular: No carotid bruit or JVD.  Cardiovascular:     Rate and Rhythm: Normal rate and regular rhythm.     Pulses: Normal pulses.     Heart sounds: Normal heart sounds. No gallop.   Pulmonary:     Effort: Pulmonary effort is normal. No respiratory distress.     Breath sounds: Normal breath sounds. No wheezing.     Comments: Good air exch Chest:     Chest wall: No tenderness.  Abdominal:     General: Bowel sounds are normal. There is no distension or abdominal bruit.     Palpations: Abdomen is soft. There is no mass.     Tenderness: There is no abdominal tenderness.     Hernia: No hernia is present.  Genitourinary:    Comments: Breast exam: No  mass, nodules, thickening, tenderness, bulging, retraction, inflamation, nipple discharge or skin changes noted.  No axillary or clavicular LA.     Scars from breast reduction noted  Musculoskeletal:        General: No tenderness. Normal range of motion.     Cervical back: Normal range of motion and neck supple. No rigidity. No muscular tenderness.     Right lower leg: No edema.     Left lower leg: No edema.  Lymphadenopathy:     Cervical: No cervical adenopathy.  Skin:    General: Skin is warm and dry.     Coloration: Skin is not pale.     Findings: No erythema or rash.     Comments: Solar lentigines diffusely Some sks  Neurological:     Mental Status: She is alert. Mental status is at baseline.     Cranial Nerves: No cranial nerve deficit.     Motor: No abnormal muscle tone.     Coordination: Coordination normal.     Gait: Gait normal.     Deep Tendon Reflexes: Reflexes are normal and symmetric. Reflexes normal.  Psychiatric:        Mood and Affect: Mood normal.        Cognition and Memory: Cognition and memory normal.           Assessment & Plan:   Problem List Items Addressed This Visit      Musculoskeletal and Integument   Osteopenia    Reviewed dexa from 9/20 - osteopenia of the spine  Had fall with rib fx this year (none since)  Taking ca and /d and D level is therapeutic Walking for exercise  Re check 2 y  Treatment with alendronate is an option        Other   Atypical lobular hyperplasia of breast    Nl breast exam today (will continue Q 6 mo) Pt will continue monthly exams  She plans to schedule mammogram this month      Hyperlipidemia    Disc goals for lipids and reasons to control them Rev last labs with pt Rev low sat fat diet in detail  Very good control with diet and exercise  LDL is 65       Routine general medical examination at a health care facility - Primary    Reviewed health habits including diet and exercise and skin cancer  prevention Reviewed appropriate screening tests for age  Also reviewed health mt list, fam hx and immunization status , as well as social and family history   See HPI amw reviewed  Labs reviewed  Pt plans to schedule mammogram/exam done  dexa reviewed (rib fx this year)  Adv to continue ca and D and exercise  utd shingrix and covid vaccines       Elevated glucose    Lab Results  Component Value Date   HGBA1C 5.8 08/14/2019   disc imp of low glycemic diet and wt loss to prevent DM2       Colon cancer screening    Negative cologuard 2/19       Vitamin D deficiency    Vitamin D level is therapeutic with current supplementation Disc importance of this to bone and overall health  Level of 31.2

## 2019-08-26 NOTE — Assessment & Plan Note (Signed)
Nl breast exam today (will continue Q 6 mo) Pt will continue monthly exams  She plans to schedule mammogram this month

## 2019-08-26 NOTE — Assessment & Plan Note (Signed)
Reviewed dexa from 9/20 - osteopenia of the spine  Had fall with rib fx this year (none since)  Taking ca and /d and D level is therapeutic Walking for exercise  Re check 2 y  Treatment with alendronate is an option

## 2019-08-26 NOTE — Assessment & Plan Note (Signed)
Vitamin D level is therapeutic with current supplementation Disc importance of this to bone and overall health  Level of 31.2

## 2019-08-26 NOTE — Assessment & Plan Note (Signed)
Reviewed health habits including diet and exercise and skin cancer prevention Reviewed appropriate screening tests for age  Also reviewed health mt list, fam hx and immunization status , as well as social and family history   See HPI amw reviewed  Labs reviewed  Pt plans to schedule mammogram/exam done  dexa reviewed (rib fx this year)  Adv to continue ca and D and exercise  utd shingrix and covid vaccines

## 2019-08-26 NOTE — Patient Instructions (Addendum)
Don't forget to schedule your mammogram   Take care of yourself  Keep walking   Eat a healthy diet   Follow up in 6 months for a breast exam

## 2019-08-26 NOTE — Assessment & Plan Note (Signed)
Lab Results  Component Value Date   HGBA1C 5.8 08/14/2019   disc imp of low glycemic diet and wt loss to prevent DM2

## 2019-09-03 ENCOUNTER — Other Ambulatory Visit: Payer: Self-pay | Admitting: Family Medicine

## 2019-09-03 DIAGNOSIS — Z1231 Encounter for screening mammogram for malignant neoplasm of breast: Secondary | ICD-10-CM

## 2019-09-11 DIAGNOSIS — D2261 Melanocytic nevi of right upper limb, including shoulder: Secondary | ICD-10-CM | POA: Diagnosis not present

## 2019-09-11 DIAGNOSIS — L821 Other seborrheic keratosis: Secondary | ICD-10-CM | POA: Diagnosis not present

## 2019-09-11 DIAGNOSIS — D2272 Melanocytic nevi of left lower limb, including hip: Secondary | ICD-10-CM | POA: Diagnosis not present

## 2019-09-11 DIAGNOSIS — D2271 Melanocytic nevi of right lower limb, including hip: Secondary | ICD-10-CM | POA: Diagnosis not present

## 2019-09-11 DIAGNOSIS — D225 Melanocytic nevi of trunk: Secondary | ICD-10-CM | POA: Diagnosis not present

## 2019-09-11 DIAGNOSIS — Z85828 Personal history of other malignant neoplasm of skin: Secondary | ICD-10-CM | POA: Diagnosis not present

## 2019-09-11 DIAGNOSIS — L570 Actinic keratosis: Secondary | ICD-10-CM | POA: Diagnosis not present

## 2019-09-11 DIAGNOSIS — D1801 Hemangioma of skin and subcutaneous tissue: Secondary | ICD-10-CM | POA: Diagnosis not present

## 2019-09-13 IMAGING — MG DIGITAL SCREENING BILATERAL MAMMOGRAM WITH TOMO AND CAD
8 series · 8 of 24 positions shown · non-contrast
Comparison: Previous exam(s).

CLINICAL DATA: Screening.

EXAM:
DIGITAL SCREENING BILATERAL MAMMOGRAM WITH TOMO AND CAD

[R MLO synth-2D]
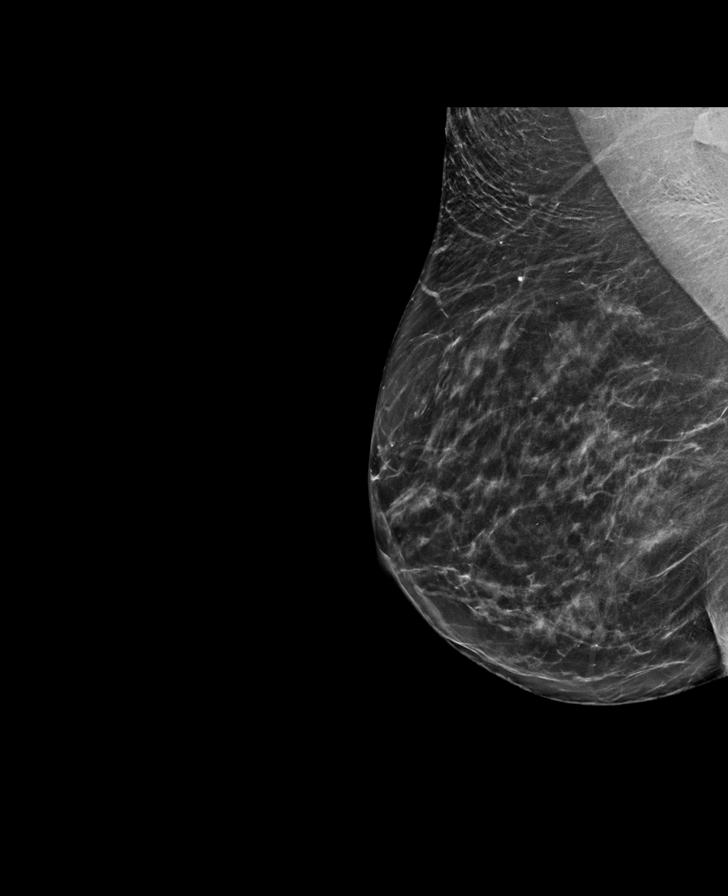

[L MLO synth-2D]
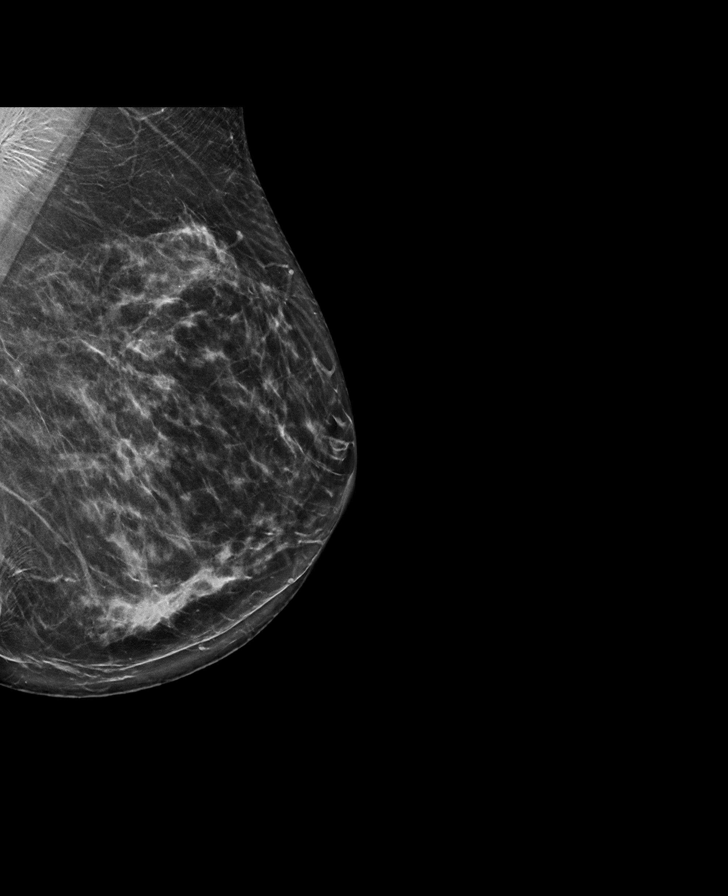

[L CC synth-2D]
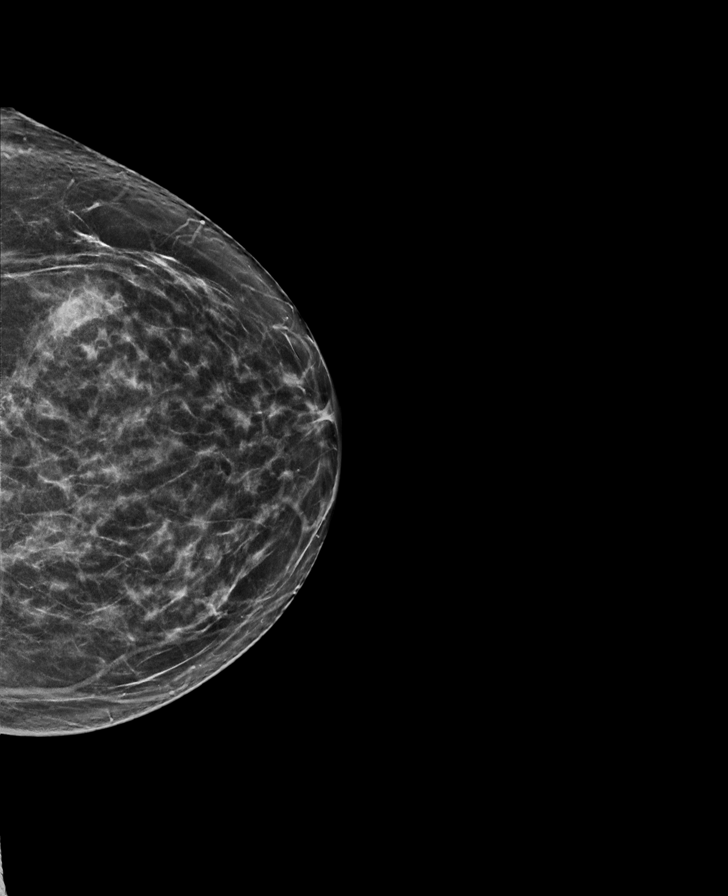

[R CC synth-2D]
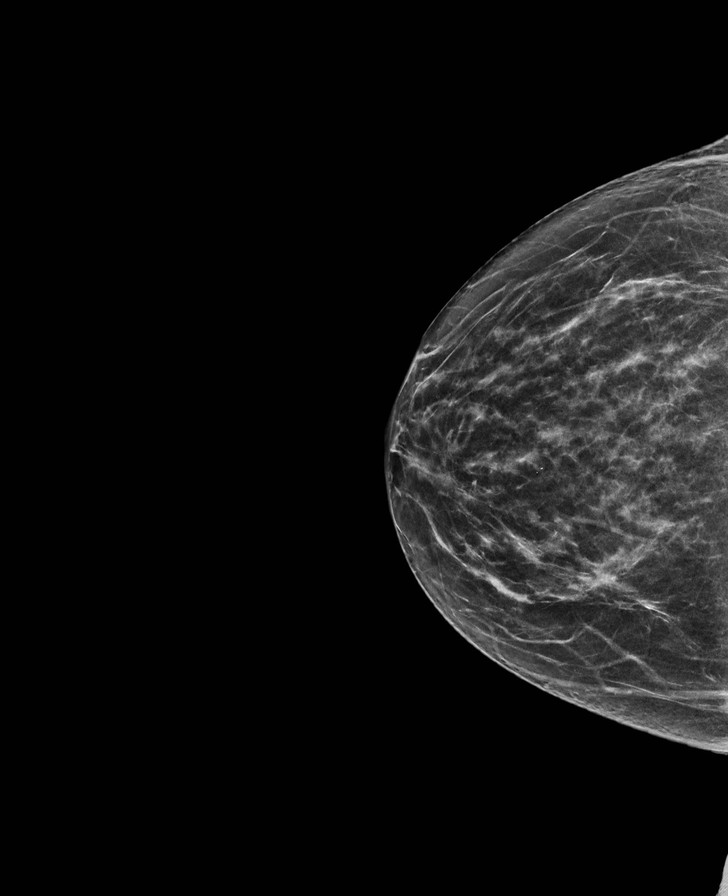

[L CC tomo · tomo slice 33/65.0]
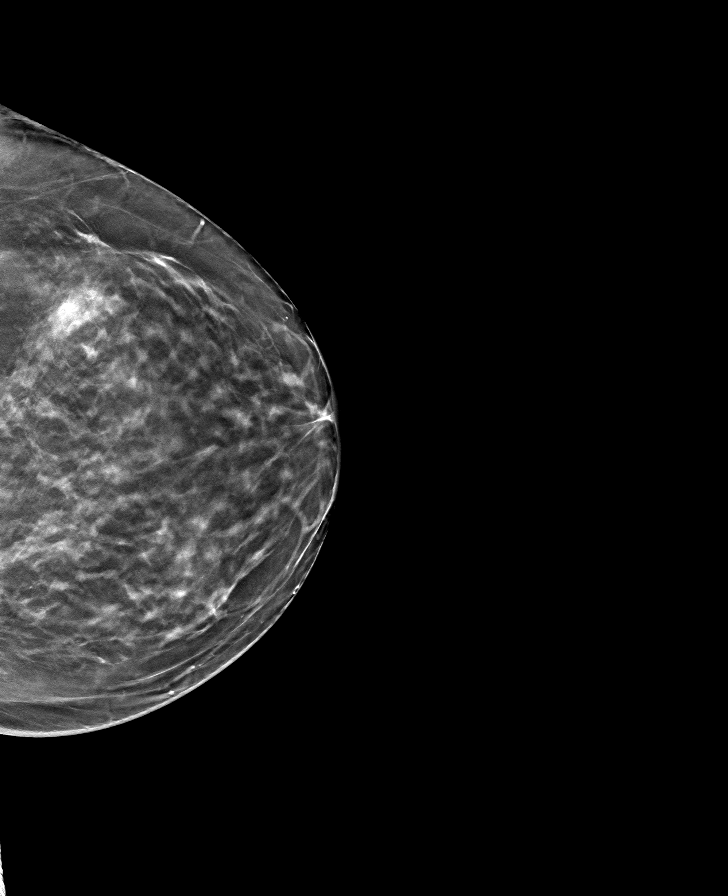

[L MLO tomo · tomo slice 37/72.0]
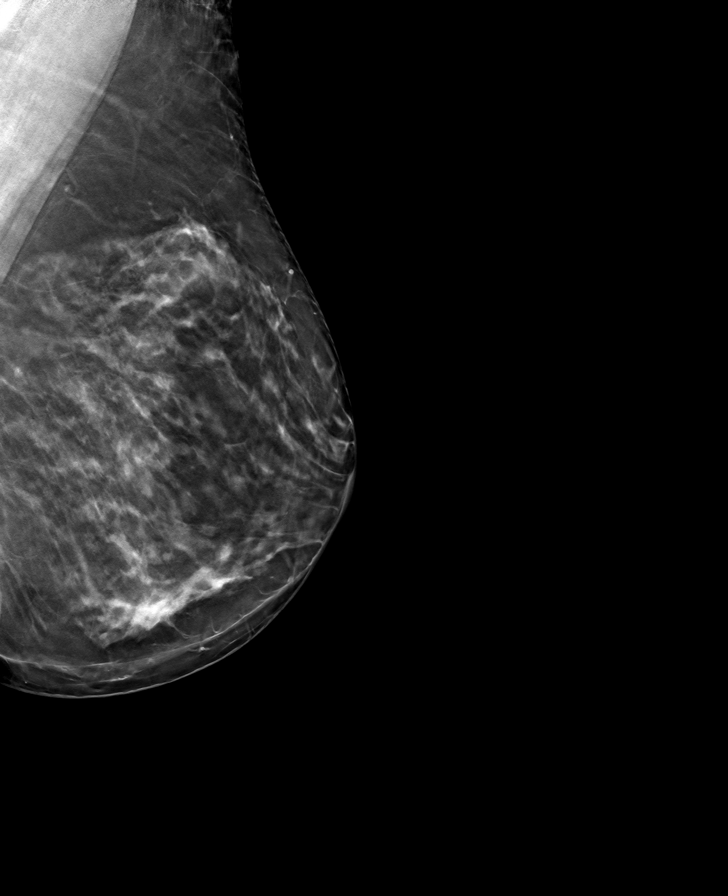

[R MLO tomo · tomo slice 37/73.0]
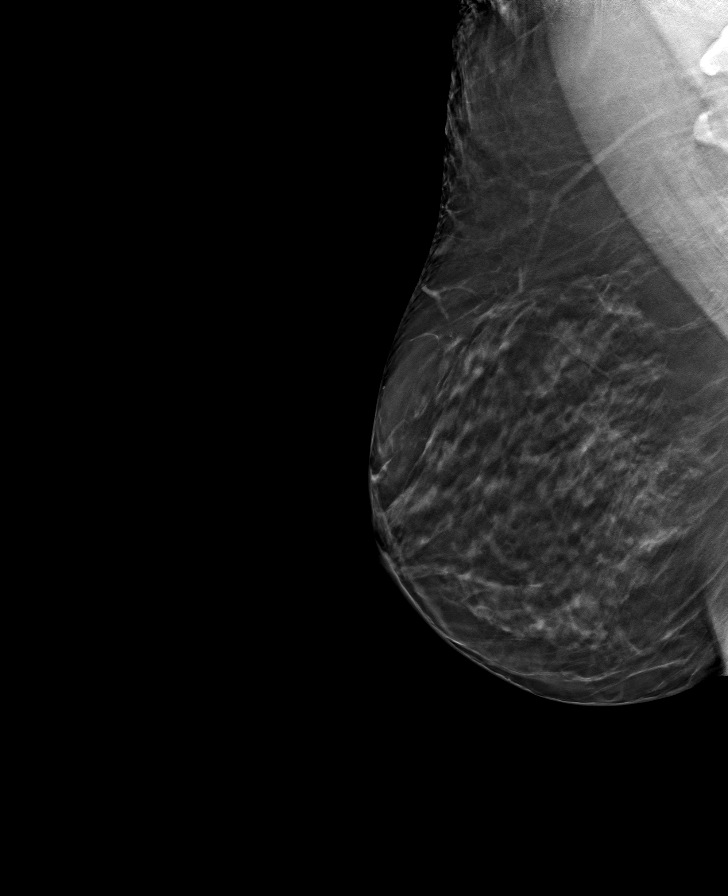

[R CC tomo · tomo slice 33/66.0]
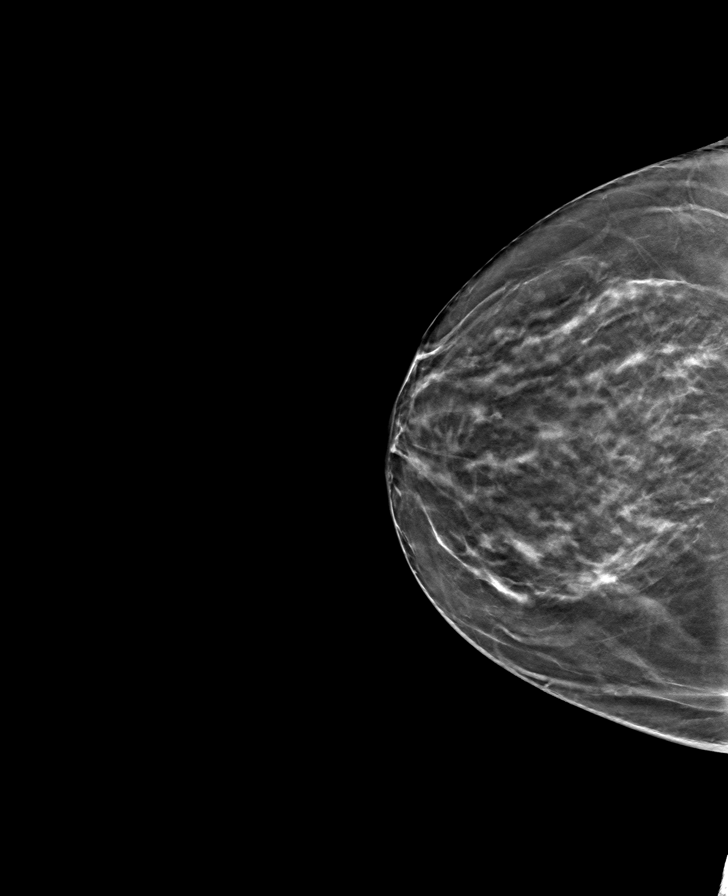

[8 of 24 positions shown; findings below may reference images not displayed]

ACR Breast Density Category c: The breast tissue is heterogeneously
dense, which may obscure small masses.
FINDINGS: There are no findings suspicious for malignancy. Images were
processed with CAD.
IMPRESSION: No mammographic evidence of malignancy. A result letter of this
screening mammogram will be mailed directly to the patient.

RECOMMENDATION:
Screening mammogram in one year. (Code:[id number]-U-LHB)

BI-RADS CATEGORY  1: Negative.

## 2019-09-25 ENCOUNTER — Ambulatory Visit
Admission: RE | Admit: 2019-09-25 | Discharge: 2019-09-25 | Disposition: A | Discharge status: Home / Self Care | Payer: Medicare PPO | Source: Ambulatory Visit | Attending: Family Medicine | Admitting: Family Medicine

## 2019-09-25 ENCOUNTER — Other Ambulatory Visit: Payer: Self-pay

## 2019-09-25 DIAGNOSIS — Z1231 Encounter for screening mammogram for malignant neoplasm of breast: Secondary | ICD-10-CM

## 2020-02-12 IMAGING — DX RIGHT RIBS AND CHEST - 3+ VIEW
3 series · 4 of 4 positions shown · non-contrast
Comparison: Chest radiographs dated 05/06/2017

CLINICAL DATA: Fall, right lower chest pain

EXAM:
RIGHT RIBS AND CHEST - 3+ VIEW

[chest ap]
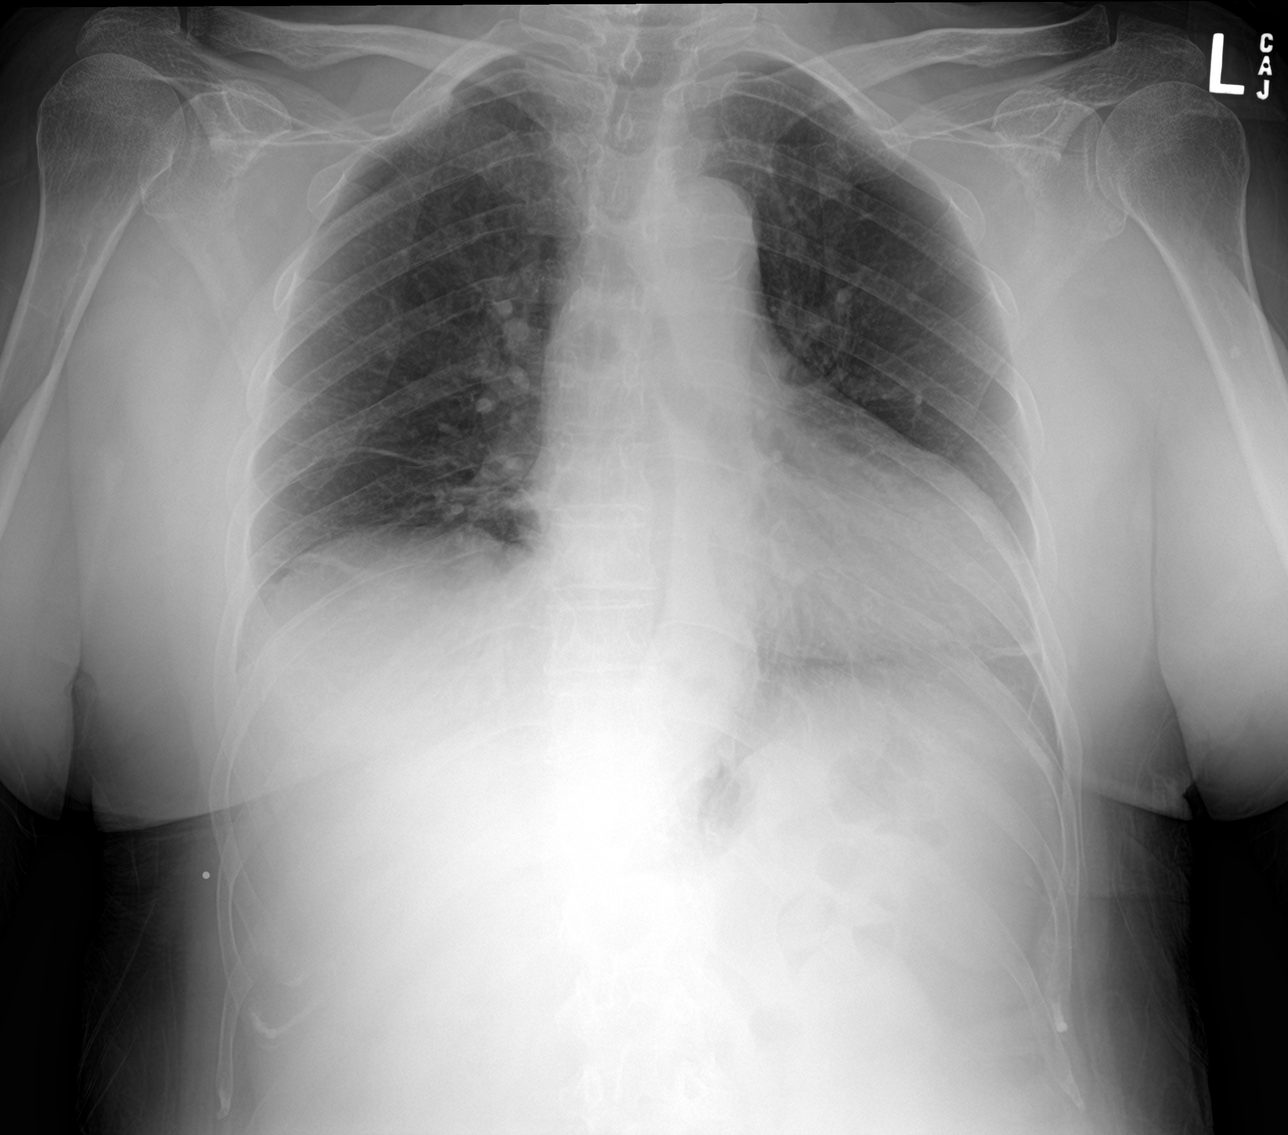

[Series 6: rib ap · 0.14mm/px · 2 of 2 slices shown]
[im 1/2]
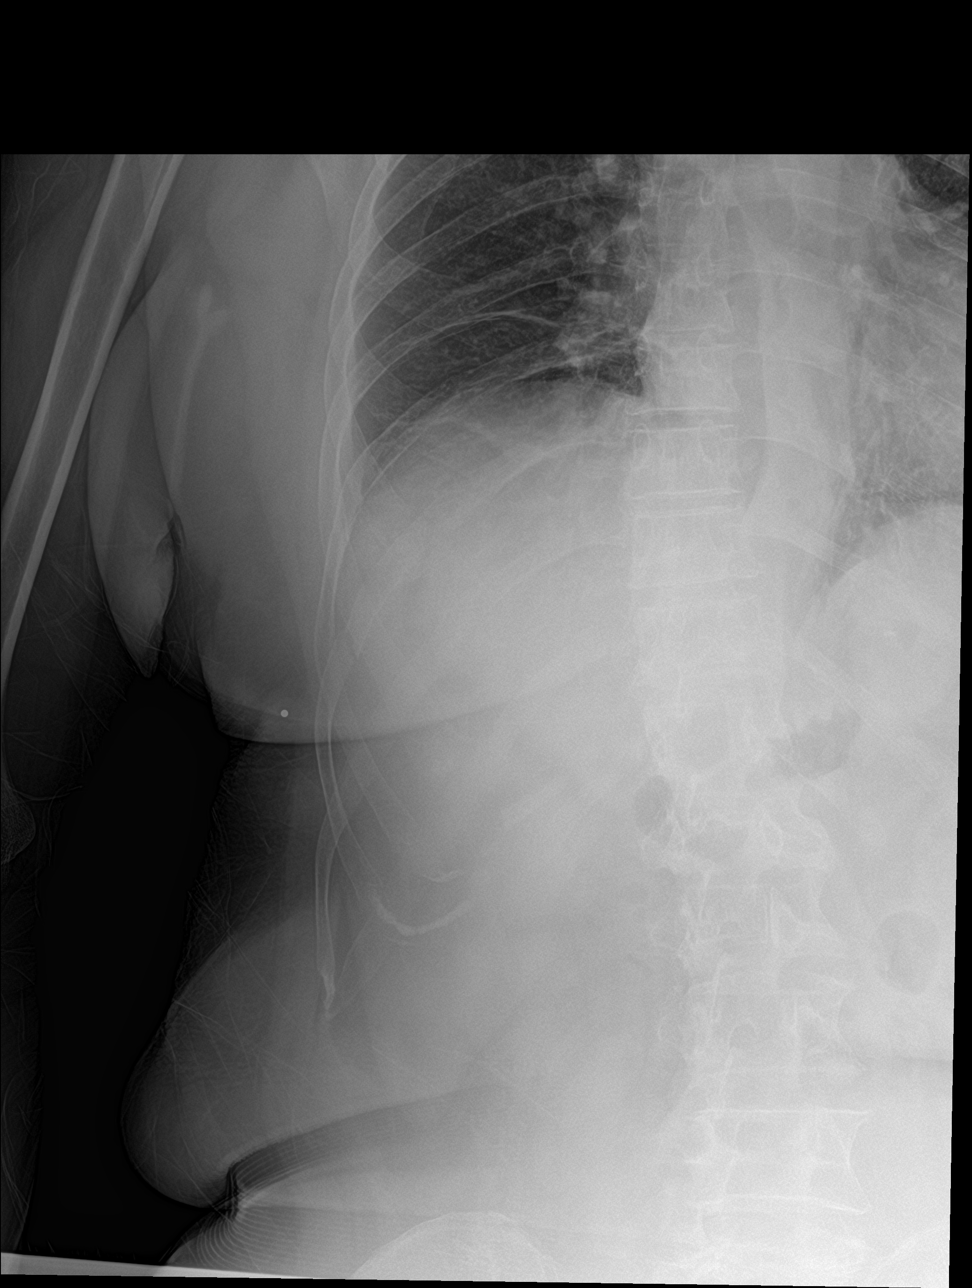
[im 2/2]
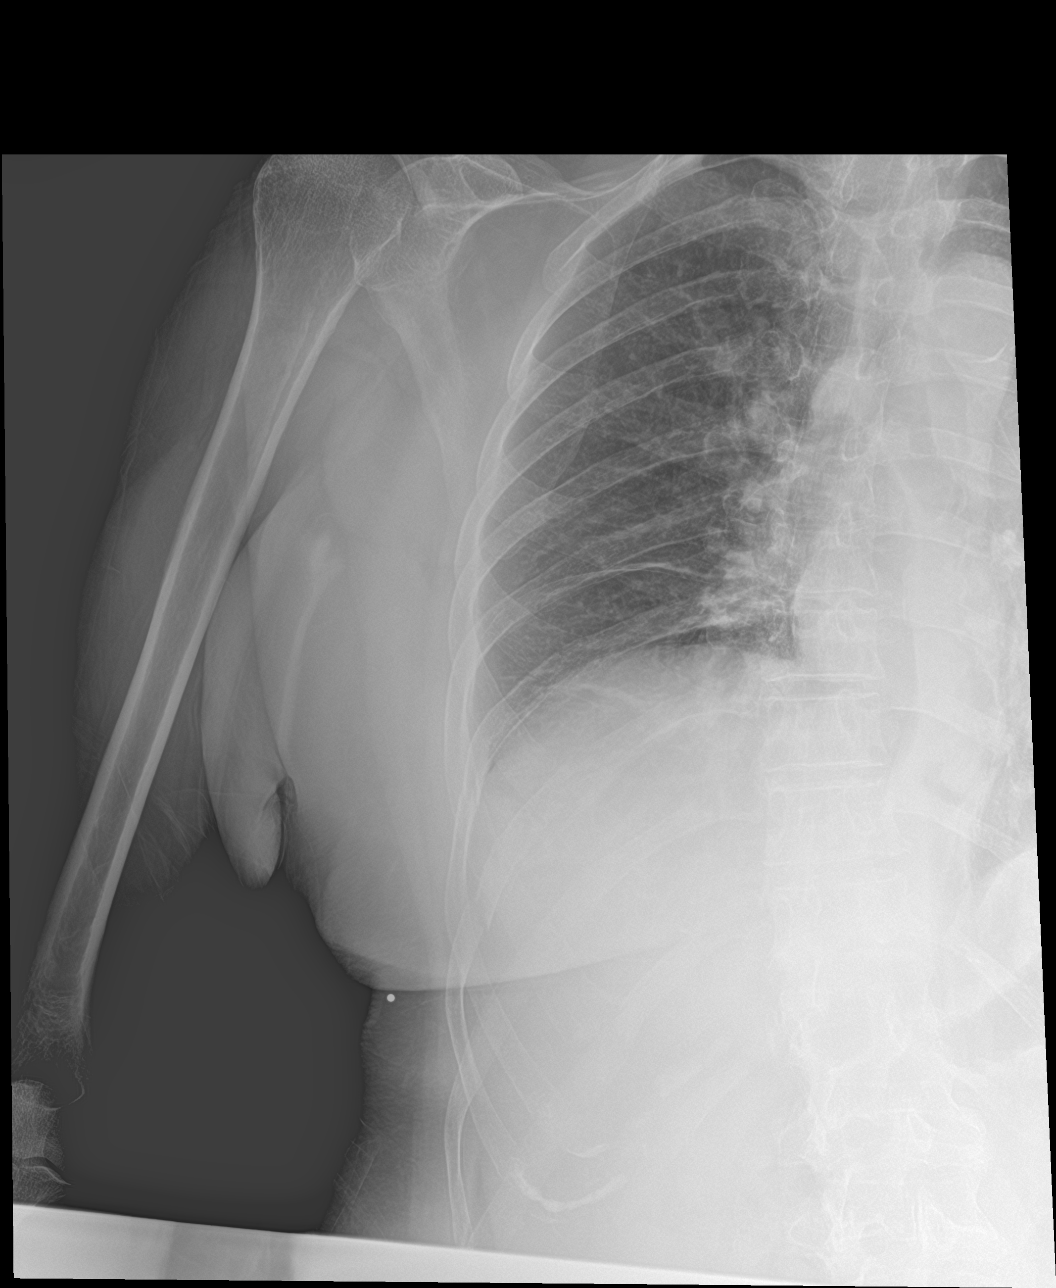

[rib ap obl]
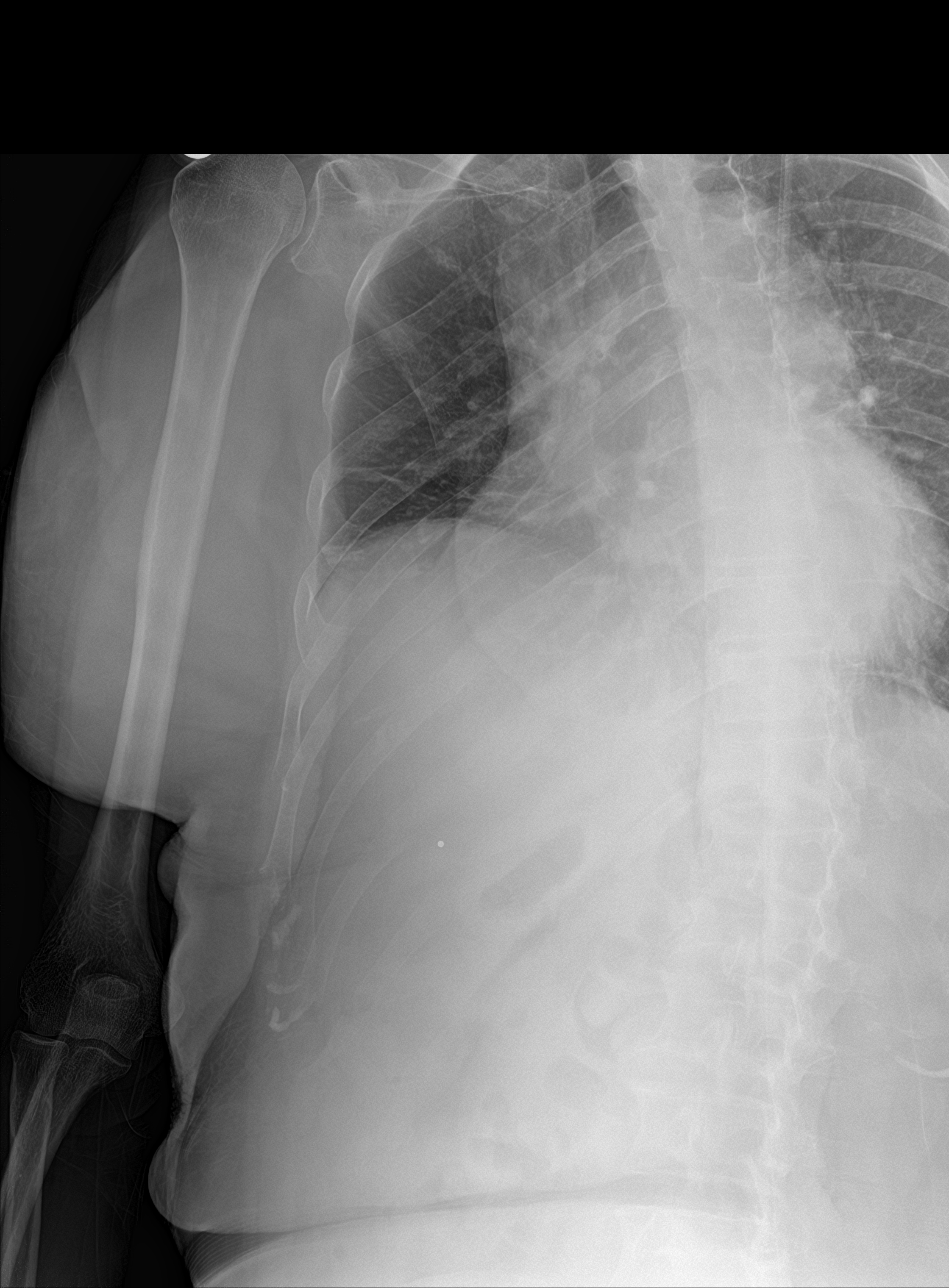

[4 of 4 positions shown; findings below may reference images not displayed]

FINDINGS: Lungs are clear.  No pleural effusion or pneumothorax.

The heart is normal in size.

Nondisplaced right lateral 8th and 9th rib fractures.
IMPRESSION: Nondisplaced right lateral 8th and 9th rib fractures.

## 2020-03-29 ENCOUNTER — Telehealth: Payer: Self-pay | Admitting: *Deleted

## 2020-03-29 NOTE — Telephone Encounter (Signed)
[facility] - Client TELEPHONE ADVICE RECORD [facility] Patient Name: Bailey Stephens Gender: Female DOB: February 01, 1946 Age: 74 Y 17 M 22 D Return Phone Number: [phone] (Primary) Address: City/State/Zip: [place] [address] Client [facility] - Client Client Site [place] MD Contact Type Call Who Is Calling Patient / Member / Family / Caregiver Call Type Triage / Clinical Relationship To Patient Self Return Phone Number [phone] (Primary) Chief Complaint Dizziness Reason for Call Symptomatic / Request for Health Information Initial Comment PT has headache, congestion and diziness. Translation No Nurse Assessment Nurse: Jimmye Norman, RN, Whitney Date/Time (Eastern Time): 03/29/2020 11:29:02 AM Confirm and document reason for call. If symptomatic, describe symptoms. ---Caller states she has headache, congestion and dizziness. States today is the third day, she thinks it may be allergies but she has never had allergies before. Does the patient have any new or worsening symptoms? ---Yes Will a triage be completed? ---Yes Related visit to physician within the last 2 weeks? ---No Does the PT have any chronic conditions? (i.e. diabetes, asthma, this includes High risk factors for pregnancy, etc.) ---No Is this a behavioral health or substance abuse call? ---No Guidelines Guideline Title Affirmed Question Affirmed Notes Nurse Date/Time (Eastern Time) Headache [1] MODERATE headache (e.g., interferes with normal activities) AND [2] present > 24 hours AND [3] unexplained (Exceptions: analgesics not tried, typical migraine, or headache part of viral illness) Jimmye Norman, RN, Whitney 03/29/2020 11:33:15 AM Disp. Time Eilene Ghazi Time) Disposition Final User 03/29/2020 10:53:47 AM Attempt made - no message left Pricilla Handler, Whitney 03/29/2020 11:13:12 AM Attempt made - no  message left Artist Pais 03/29/2020 11:36:53 AM See PCP within 24 Hours Yes Jimmye Norman, RN, Loree Fee PLEASE NOTE: All timestamps contained within this report are represented as [place] Standard Time. CONFIDENTIALTY NOTICE: This fax transmission is intended only for the addressee. It contains information that is legally privileged, confidential or otherwise protected from use or disclosure. If you are not the intended recipient, you are strictly prohibited from reviewing, disclosing, copying using or disseminating any of this information or taking any action in reliance on or regarding this information. If you have received this fax in error, please notify us immediately by telephone so that we can arrange for its return to [place]. Phone: [phone], Toll-Free: [phone], Fax: [phone] Page: 2 of 2 Call Id: [id number] Tift Disagree/Comply Comply Caller Understands Yes PreDisposition InappropriateToAsk Care Advice Given Per Guideline SEE PCP WITHIN 24 HOURS: * IF OFFICE WILL BE OPEN: You need to be examined within the next 24 hours. Call your doctor (or NP/PA) when the office opens and make an appointment. CALL BACK IF: * You become worse PAIN MEDICINES: * For pain relief, you can take either acetaminophen, ibuprofen, or naproxen. Referrals REFERRED TO PCP OFFICE

## 2020-03-29 NOTE — Telephone Encounter (Signed)
Pt was sent to [facility] due to URI/SOB sxs. They advise pt she needs to be seen in the next 24 hrs. We have no appts available and given sxs I advise pt she should be seen at a UC. Pt said she will "see how she feels tomorrow" and if she still feels sick she will go to an UC then. Pt advise if sxs worsen today she should also go to ER or UC  FYI to PCP

## 2020-03-29 NOTE — Telephone Encounter (Signed)
Aware Unfortunately I am out of the office this week  Agree with advisement and will watch for correspondence

## 2020-03-29 NOTE — Telephone Encounter (Signed)
[place] CMA has already spoken with pt in this phone note.

## 2020-05-23 ENCOUNTER — Other Ambulatory Visit: Payer: Self-pay

## 2020-05-23 ENCOUNTER — Telehealth: Payer: Self-pay

## 2020-05-23 ENCOUNTER — Ambulatory Visit
Admission: EM | Admit: 2020-05-23 | Discharge: 2020-05-23 | Disposition: A | Discharge status: Home / Self Care | Payer: Medicare PPO | Attending: Emergency Medicine | Admitting: Emergency Medicine

## 2020-05-23 DIAGNOSIS — M546 Pain in thoracic spine: Secondary | ICD-10-CM | POA: Diagnosis not present

## 2020-05-23 DIAGNOSIS — R1011 Right upper quadrant pain: Secondary | ICD-10-CM | POA: Diagnosis not present

## 2020-05-23 MED ORDER — DICLOFENAC SODIUM 1 % EX GEL: 2.0000 g | Freq: Four times a day (QID) | CUTANEOUS | 0 refills | Status: DC

## 2020-05-23 NOTE — Telephone Encounter (Signed)
If she has cp and sob -needs to go to the ER for urgent eval

## 2020-05-23 NOTE — Telephone Encounter (Addendum)
Pt left v/m that last night started with CP under rt breast; pain continues today.unable to reach pt or her husband by phone at any contact # left v/m requesting pt to cb. Will send note to Dr Glori Bickers and [place] CMA.  I spoke with pt and she went to pharmacy and got gas relief and so far no relief. Pt said the rt dull CP comes and goes. Pt said last night CP lasted for over 30';pain did not radiate any where. Pt has SOB upon exertion; that is a new symptom. Pt usually walks 3 miles per day without SOB. No covid symptoms; pt is audibly SOB on phone while walking in pts home. Pt does not have way to ck BP; I spoke with Dr Glori Bickers because pt does not want to go to [facility] or ED. Dr Glori Bickers said pt should go to UC now. Pt said her husband will take pt to [facility] on [address] now. FYI to Dr Glori Bickers.

## 2020-05-23 NOTE — Discharge Instructions (Signed)
If a rash pops up, please come back.  Heat therapy (hot compress, warm wash rag, hot showers, etc.) can help relax muscles and soothe muscle aches. Cold therapy (ice packs) can be used to help swelling both after injury and after prolonged use of areas of chronic pain/aches.  Pain medication:  350 mg-1000 mg of Tylenol (acetaminophen) and/or 200 mg - 800 mg of Advil (ibuprofen, Motrin) every 8 hours as needed.  May alternate between the two throughout the day as they are generally safe to take together.  DO NOT exceed more than 3000 mg of Tylenol or 3200 mg of ibuprofen in a 24 hour period as this could damage your stomach, kidneys, liver, or increase your bleeding risk.

## 2020-05-23 NOTE — ED Provider Notes (Signed)
[facility]    CSN: [id number] Arrival date & time: 05/23/20  1733      History   Chief Complaint Chief Complaint  Patient presents with  . Flank Pain    HPI Bailey Stephens is a 74 y.o. female  W/ hx as below presenting for RUQ & R thoracic back pain x yesterday.  No inciting event/trauma.  Has tried single tab motrin w/ minimal relief.  Has been vax'd for shingles; denies h/o chicken pox.  No CP/SOB, cough, fever.  Overall feels well.  Past Medical History:  Diagnosis Date  . Back pain    due to large breasts  . Fever blister   . GERD (gastroesophageal reflux disease)   . No pertinent past medical history   . Overweight(278.02)     Patient Active Problem List   Diagnosis Date Noted  . H/O fracture of rib 02/03/2019  . Colon cancer screening 07/26/2017  . Vitamin D deficiency 07/26/2017  . Elevated glucose 07/21/2017  . Contact dermatitis 02/29/2016  . Hyperlipidemia 06/02/2015  . Routine general medical examination at a health care facility 06/02/2015  . 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 Osteopenia 07/15/2014  . Estrogen deficiency 06/07/2014  . Abnormal breast biopsy 06/07/2014  . Encounter for Medicare annual wellness exam 05/24/2013  . 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 Diabetes mellitus screening 02/12/2011  . GERD (gastroesophageal reflux disease) 02/12/2011    Past Surgical History:  Procedure Laterality Date  . BREAST REDUCTION SURGERY  12/01/13  . BUNIONECTOMY  1988  . cataract surgery  2010  . CESAREAN SECTION    . MASS EXCISION  06/08/2011   Procedure: EXCISION MASS;  Surgeon: Cammie Sickle., MD;  Location: [place];  Service: Orthopedics;  Laterality: Right;  excisional biopsy of exostosis right thumb  . REDUCTION MAMMAPLASTY Bilateral 2014  . TONSILLECTOMY      OB History   No obstetric history on file.      Home Medications    Prior to Admission medications    Medication Sig Start Date End Date Taking? Authorizing Provider  Calcium Carbonate-Vitamin D (CALCIUM + D PO) Take 1 capsule by mouth 2 (two) times daily.    [provider]  Cholecalciferol (VITAMIN D) 1000 UNITS capsule Take 2,000 Units by mouth daily.     [provider]  diclofenac Sodium (VOLTAREN) 1 % GEL Apply 2 g topically 4 (four) times daily. 05/23/20   Hall-Potvin, [place], PA-C  Multiple Vitamin (MULTIVITAMIN) tablet Take 1 tablet by mouth daily.      [provider]  triamcinolone cream (KENALOG) 0.1 % APPLY TOPICALLYTO THE AFFECTED AREAS 2 (TWO) TIMES DAILY AS NEEDED. 08/26/19   Tower, Wynelle Fanny, MD    Family History Family History  Problem Relation Age of Onset  . Asthma Mother   . Cancer Father        brain tumor  . Heart disease Maternal Grandfather        MI  . Breast cancer Sister     Social History Social History   Tobacco Use  . Smoking status: Former Smoker    Quit date: 06/07/1975    Years since quitting: 44.9  . Smokeless tobacco: Never Used  . Tobacco comment: quit 1976  Substance Use Topics  . Alcohol use: Yes    Alcohol/week: 2.0 - 3.0 standard drinks    Types: 2 - 3 Glasses of wine per week    Comment: occasional wine/alcohol  .  Drug use: No     Allergies   Penicillins   Review of Systems Review of Systems  Constitutional: Negative for fatigue and fever.  HENT: Negative for ear pain, sinus pain, sore throat and voice change.   Eyes: Negative for pain, redness and visual disturbance.  Respiratory: Negative for cough and shortness of breath.   Cardiovascular: Negative for chest pain and palpitations.  Gastrointestinal: Positive for abdominal pain. Negative for diarrhea and vomiting.  Musculoskeletal: Positive for back pain. Negative for arthralgias and myalgias.  Skin: Negative for rash and wound.  Neurological: Negative for syncope and headaches.     Physical Exam Triage Vital Signs ED Triage Vitals  Enc  Vitals Group     BP 05/23/20 1745 139/82     Pulse Rate 05/23/20 1745 83     Resp 05/23/20 1745 17     Temp 05/23/20 1745 (!) 97.2 F (36.2 C)     Temp Source 05/23/20 1745 Oral     SpO2 05/23/20 1745 96 %     Weight --      Height --      Head Circumference --      Peak Flow --      Pain Score 05/23/20 1742 7     Pain Loc --      Pain Edu? --      Excl. in [place]? --    No data found.  Updated Vital Signs BP 139/82 (BP Location: Left Arm)   Pulse 83   Temp (!) 97.2 F (36.2 C) (Oral)   Resp 17   SpO2 96%   Visual Acuity Right Eye Distance:   Left Eye Distance:   Bilateral Distance:    Right Eye Near:   Left Eye Near:    Bilateral Near:     Physical Exam Constitutional:      General: She is not in acute distress. HENT:     Head: Normocephalic and atraumatic.  Eyes:     General: No scleral icterus.    Pupils: Pupils are equal, round, and reactive to light.  Cardiovascular:     Rate and Rhythm: Normal rate and regular rhythm.  Pulmonary:     Effort: Pulmonary effort is normal. No respiratory distress.     Breath sounds: No wheezing.  Chest:    Abdominal:     Tenderness: There is no abdominal tenderness.  Musculoskeletal:     Cervical back: Normal.     Thoracic back: Tenderness present. No bony tenderness.     Lumbar back: Normal.       Back:  Skin:    Coloration: Skin is not jaundiced or pale.     Findings: No rash.  Neurological:     General: No focal deficit present.     Mental Status: She is alert and oriented to person, place, and time.      UC Treatments / Results  Labs (all labs ordered are listed, but only abnormal results are displayed) Labs Reviewed - No data to display  EKG   Radiology No results found.  Procedures Procedures (including critical care time)  Medications Ordered in UC Medications - No data to display  Initial Impression / Assessment and Plan / UC Course  I have reviewed the triage vital signs and the nursing  notes.  Pertinent labs & imaging results that were available during my care of the patient were reviewed by me and considered in my medical decision making (see chart for details).  Appears well today.  Does have dermatonal distribution w/o rash or h/o chicken pox in pt who is vax'd for shingles.  Discussed return precautions of shingles.  Will treat supportively as below in the interim.  Return precautions discussed, pt verbalized understanding and is agreeable to plan. Final Clinical Impressions(s) / UC Diagnoses   Final diagnoses:  RUQ pain  Acute right-sided thoracic back pain     Discharge Instructions     If a rash pops up, please come back.  Heat therapy (hot compress, warm wash rag, hot showers, etc.) can help relax muscles and soothe muscle aches. Cold therapy (ice packs) can be used to help swelling both after injury and after prolonged use of areas of chronic pain/aches.  Pain medication:  350 mg-1000 mg of Tylenol (acetaminophen) and/or 200 mg - 800 mg of Advil (ibuprofen, Motrin) every 8 hours as needed.  May alternate between the two throughout the day as they are generally safe to take together.  DO NOT exceed more than 3000 mg of Tylenol or 3200 mg of ibuprofen in a 24 hour period as this could damage your stomach, kidneys, liver, or increase your bleeding risk.    ED Prescriptions    Medication Sig Dispense Auth. Provider   diclofenac Sodium (VOLTAREN) 1 % GEL Apply 2 g topically 4 (four) times daily. 100 g Hall-Potvin, [place], PA-C     PDMP not reviewed this encounter.   Neldon Mc Andrews, [place] 05/23/20 1905

## 2020-05-23 NOTE — ED Triage Notes (Signed)
Pt is here with right flank pain that started yesterday, pt has taken gas relief meds to relieve discomfort.

## 2020-05-24 ENCOUNTER — Ambulatory Visit: Payer: Medicare PPO | Admitting: Family Medicine

## 2020-05-24 NOTE — Telephone Encounter (Signed)
I don't see anything in epic re: UC or ER Please check in with her  Thanks

## 2020-05-24 NOTE — Telephone Encounter (Signed)
I see that , thanks  Reassuring

## 2020-05-24 NOTE — Telephone Encounter (Signed)
Pt went to UC last night, looks like she only told them she was having stomach pain and back pain and per their note it says she told them she wasn't having any SOB or Chest pain, please review UC note

## 2020-08-12 ENCOUNTER — Other Ambulatory Visit: Payer: Self-pay | Admitting: Family Medicine

## 2020-08-12 DIAGNOSIS — Z1231 Encounter for screening mammogram for malignant neoplasm of breast: Secondary | ICD-10-CM

## 2020-08-21 ENCOUNTER — Telehealth: Payer: Self-pay | Admitting: Family Medicine

## 2020-08-21 DIAGNOSIS — R7309 Other abnormal glucose: Secondary | ICD-10-CM

## 2020-08-21 DIAGNOSIS — E7800 Pure hypercholesterolemia, unspecified: Secondary | ICD-10-CM

## 2020-08-21 DIAGNOSIS — E559 Vitamin D deficiency, unspecified: Secondary | ICD-10-CM

## 2020-08-21 DIAGNOSIS — Z0000 Encounter for general adult medical examination without abnormal findings: Secondary | ICD-10-CM

## 2020-08-21 NOTE — Telephone Encounter (Signed)
-----   Message from Cloyd Stagers, RT sent at 08/08/2020  1:32 PM EST ----- Regarding: Lab Orders for Monday 2.21.2022 Please place lab orders for Monday 2.21.2022, office visit for physical on Monday 2.28.2022 Thank you, Dyke Maes RT(R)

## 2020-08-22 ENCOUNTER — Other Ambulatory Visit (INDEPENDENT_AMBULATORY_CARE_PROVIDER_SITE_OTHER): Payer: Medicare PPO

## 2020-08-22 ENCOUNTER — Other Ambulatory Visit: Payer: Self-pay

## 2020-08-22 DIAGNOSIS — E559 Vitamin D deficiency, unspecified: Secondary | ICD-10-CM | POA: Diagnosis not present

## 2020-08-22 DIAGNOSIS — Z0000 Encounter for general adult medical examination without abnormal findings: Secondary | ICD-10-CM

## 2020-08-22 DIAGNOSIS — E7800 Pure hypercholesterolemia, unspecified: Secondary | ICD-10-CM | POA: Diagnosis not present

## 2020-08-22 DIAGNOSIS — R7309 Other abnormal glucose: Secondary | ICD-10-CM

## 2020-08-22 LAB — COMPREHENSIVE METABOLIC PANEL
ALT: 13 U/L (ref 0–35)
AST: 16 U/L (ref 0–37)
Albumin: 4.2 g/dL (ref 3.5–5.2)
Alkaline Phosphatase: 66 U/L (ref 39–117)
BUN: 19 mg/dL (ref 6–23)
CO2: 26 mEq/L (ref 19–32)
Calcium: 9.5 mg/dL (ref 8.4–10.5)
Chloride: 103 mEq/L (ref 96–112)
Creatinine, Ser: 0.88 mg/dL (ref 0.40–1.20)
GFR: 63.59 mL/min (ref >60.00)
Glucose, Bld: 96 mg/dL (ref 70–99)
Potassium: 4.1 mEq/L (ref 3.5–5.1)
Sodium: 140 mEq/L (ref 135–145)
Total Bilirubin: 0.6 mg/dL (ref 0.2–1.2)
Total Protein: 6.7 g/dL (ref 6.0–8.3)

## 2020-08-22 LAB — CBC WITH DIFFERENTIAL/PLATELET
Basophils Absolute: 0 10*3/uL (ref 0.0–0.1)
Basophils Relative: 0.7 % (ref 0.0–3.0)
Eosinophils Absolute: 0.1 10*3/uL (ref 0.0–0.7)
Eosinophils Relative: 1.9 % (ref 0.0–5.0)
HCT: 41.4 % (ref 36.0–46.0)
Hemoglobin: 13.9 g/dL (ref 12.0–15.0)
Lymphocytes Relative: 30.8 % (ref 12.0–46.0)
Lymphs Abs: 2.1 10*3/uL (ref 0.7–4.0)
MCHC: 33.7 g/dL (ref 30.0–36.0)
MCV: 92.1 fl (ref 78.0–100.0)
Monocytes Absolute: 0.5 10*3/uL (ref 0.1–1.0)
Monocytes Relative: 7.2 % (ref 3.0–12.0)
Neutro Abs: 4.1 10*3/uL (ref 1.4–7.7)
Neutrophils Relative %: 59.4 % (ref 43.0–77.0)
Platelets: 288 10*3/uL (ref 150.0–400.0)
RBC: 4.5 Mil/uL (ref 3.87–5.11)
RDW: 13.6 % (ref 11.5–15.5)
WBC: 6.9 10*3/uL (ref 4.0–10.5)

## 2020-08-22 LAB — TSH: TSH: 2.55 u[IU]/mL (ref 0.35–4.50)

## 2020-08-22 LAB — HEMOGLOBIN A1C: Hgb A1c MFr Bld: 5.6 % (ref 4.6–6.5)

## 2020-08-22 LAB — LIPID PANEL
Cholesterol: 194 mg/dL (ref 0–200)
HDL: 75.4 mg/dL (ref >39.00)
LDL Cholesterol: 92 mg/dL (ref 0–99)
NonHDL: 118.51
Total CHOL/HDL Ratio: 3
Triglycerides: 135 mg/dL (ref 0.0–149.0)
VLDL: 27 mg/dL (ref 0.0–40.0)

## 2020-08-22 LAB — VITAMIN D 25 HYDROXY (VIT D DEFICIENCY, FRACTURES): VITD: 33.08 ng/mL (ref 30.00–100.00)

## 2020-08-29 ENCOUNTER — Encounter: Payer: Self-pay | Admitting: Family Medicine

## 2020-08-29 ENCOUNTER — Ambulatory Visit: Payer: Medicare PPO | Admitting: Family Medicine

## 2020-08-29 ENCOUNTER — Other Ambulatory Visit: Payer: Self-pay

## 2020-08-29 VITALS — BP 122/78 | HR 73 | Temp 95.5°F | Ht 60.25 in | Wt 136.0 lb

## 2020-08-29 DIAGNOSIS — E7800 Pure hypercholesterolemia, unspecified: Secondary | ICD-10-CM

## 2020-08-29 DIAGNOSIS — Z1211 Encounter for screening for malignant neoplasm of colon: Secondary | ICD-10-CM | POA: Diagnosis not present

## 2020-08-29 DIAGNOSIS — N6099 Unspecified benign mammary dysplasia of unspecified breast: Secondary | ICD-10-CM

## 2020-08-29 DIAGNOSIS — R7309 Other abnormal glucose: Secondary | ICD-10-CM

## 2020-08-29 DIAGNOSIS — E559 Vitamin D deficiency, unspecified: Secondary | ICD-10-CM | POA: Diagnosis not present

## 2020-08-29 DIAGNOSIS — M8588 Other specified disorders of bone density and structure, other site: Secondary | ICD-10-CM | POA: Diagnosis not present

## 2020-08-29 DIAGNOSIS — L237 Allergic contact dermatitis due to plants, except food: Secondary | ICD-10-CM

## 2020-08-29 DIAGNOSIS — Z0000 Encounter for general adult medical examination without abnormal findings: Secondary | ICD-10-CM

## 2020-08-29 MED ORDER — ESTRADIOL 0.1 MG/GM VA CREA: TOPICAL_CREAM | VAGINAL | 3 refills | Status: DC

## 2020-08-29 MED ORDER — TRIAMCINOLONE ACETONIDE 0.1 % EX CREA: TOPICAL_CREAM | CUTANEOUS | 3 refills | Status: DC

## 2020-08-29 NOTE — Assessment & Plan Note (Signed)
Level is stable in 30s Vitamin D level is therapeutic with current supplementation Disc importance of this to bone and overall health

## 2020-08-29 NOTE — Progress Notes (Signed)
Subjective:    Patient ID: Bailey Stephens, female    DOB: Feb 08, 1946, 75 y.o.   MRN: [id number]  This visit occurred during the SARS-CoV-2 public health emergency.  Safety protocols were in place, including screening questions prior to the visit, additional usage of staff PPE, and extensive cleaning of exam room while observing appropriate contact time as indicated for disinfecting solutions.    HPI Pt presents for amw visit and health mt exam   I have personally reviewed the Medicare Annual Wellness questionnaire and have noted 1. The patient's medical and social history 2. Their use of alcohol, tobacco or illicit drugs 3. Their current medications and supplements 4. The patient's functional ability including ADL's, fall risks, home safety risks and hearing or visual             impairment. 5. Diet and physical activities 6. Evidence for depression or mood disorders  The patients weight, height, BMI have been recorded in the chart and visual acuity is per eye clinic.  I have made referrals, counseling and provided education to the patient based review of the above and I have provided the pt with a written personalized care plan for preventive services. Reviewed and updated provider list, see scanned forms.  See scanned forms.  Routine anticipatory guidance given to patient.  See health maintenance. Colon cancer screening  Negative cologuard 2/19 (due) Breast cancer screening  Mammogram 3/21 (has It scheduled next mo)  History of atypical lobular hyperplasia and has declined tamoxifen Self breast exam-no lumps (checks here q 6 mo)  Flu vaccine 9/21 covid vaccinated- had booster (unsure of date)  Tetanus vaccine  2/11 Td  Pneumovax-completed Zoster vaccine-shingrix series 2019 Dexa 9/20 osteopenia Falls-none (last 2 y ago) [employer]  Ca and D with extra D  Vitamin D level stable in the 30s  Exercise - walks in or outside and some gardening   Advance  directive up to date  Cognitive function addressed- see scanned forms- and if abnormal then additional documentation follows.   Takes her a little longer to remember things (like names)  Comes to her later  She reads all the time  Does finances without problems    PMH and SH reviewed  Meds, vitals, and allergies reviewed.   ROS: See HPI.  Otherwise negative.    Weight :    Wt Readings from Last 3 Encounters:  08/29/20 136 lb (61.7 kg)  02/03/19 137 lb 4 oz (62.3 kg)  01/25/19 128 lb (58.1 kg)   26.34 kg/m   She quit drinking wine on weekdays and tries to eat better    Hearing/vision:  Hearing Screening   125Hz  250Hz  500Hz  1000Hz  2000Hz  3000Hz  4000Hz  6000Hz  8000Hz   Right ear:   40 40 40  40    Left ear:   40 40 40  40    Vision Screening Comments: Dr. Kathrin Penner did eye exam in April 2021   Care team : Mansour Balboa-pcp [place]-podiatry  Had shingles since last visit  Mild-had pain under breast no rash (immunized)   BP Readings from Last 3 Encounters:  08/29/20 122/78  05/23/20 139/82  08/26/19 128/80   Pulse Readings from Last 3 Encounters:  08/29/20 73  05/23/20 83  08/26/19 86     Past elevated glucose Lab Results  Component Value Date   HGBA1C 5.6 08/22/2020    Hyperlipidemia  Lab Results  Component Value Date   CHOL 194 08/22/2020   CHOL 155 08/14/2019  CHOL 176 08/08/2018   Lab Results  Component Value Date   HDL 75.40 08/22/2020   HDL 67.50 08/14/2019   HDL 68.90 08/08/2018   Lab Results  Component Value Date   LDLCALC 92 08/22/2020   LDLCALC 65 08/14/2019   LDLCALC 82 08/08/2018   Lab Results  Component Value Date   TRIG 135.0 08/22/2020   TRIG 117.0 08/14/2019   TRIG 128.0 08/08/2018   Lab Results  Component Value Date   CHOLHDL 3 08/22/2020   CHOLHDL 2 08/14/2019   CHOLHDL 3 08/08/2018   No results found for: LDLDIRECT Diet controlled  HDL is good   Uses estrogen vag cream twice weekly  Vit D def   Level 33.0 , stable   Other labs Lab Results  Component Value Date   CREATININE 0.88 08/22/2020   BUN 19 08/22/2020   NA 140 08/22/2020   K 4.1 08/22/2020   CL 103 08/22/2020   CO2 26 08/22/2020   Lab Results  Component Value Date   ALT 13 08/22/2020   AST 16 08/22/2020   ALKPHOS 66 08/22/2020   BILITOT 0.6 08/22/2020   Lab Results  Component Value Date   WBC 6.9 08/22/2020   HGB 13.9 08/22/2020   HCT 41.4 08/22/2020   MCV 92.1 08/22/2020   PLT 288.0 08/22/2020   Lab Results  Component Value Date   TSH 2.55 08/22/2020    Patient Active Problem List   Diagnosis Date Noted  . H/O fracture of rib 02/03/2019  . Colon cancer screening 07/26/2017  . Vitamin D deficiency 07/26/2017  . Elevated glucose 07/21/2017  . Contact dermatitis 02/29/2016  . Hyperlipidemia 06/02/2015  . Routine general medical examination at a health care facility 06/02/2015  . 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 Osteopenia 07/15/2014  . Estrogen deficiency 06/07/2014  . Abnormal breast biopsy 06/07/2014  . Encounter for Medicare annual wellness exam 05/24/2013  . 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 Diabetes mellitus screening 02/12/2011  . GERD (gastroesophageal reflux disease) 02/12/2011   Past Medical History:  Diagnosis Date  . Back pain    due to large breasts  . Fever blister   . GERD (gastroesophageal reflux disease)   . No pertinent past medical history   . Overweight(278.02)    Past Surgical History:  Procedure Laterality Date  . BREAST REDUCTION SURGERY  12/01/13  . BUNIONECTOMY  1988  . cataract surgery  2010  . CESAREAN SECTION    . MASS EXCISION  06/08/2011   Procedure: EXCISION MASS;  Surgeon: Cammie Sickle., MD;  Location: [place] [facility];  Service: Orthopedics;  Laterality: Right;  excisional biopsy of exostosis right thumb  . REDUCTION MAMMAPLASTY Bilateral 2014  . TONSILLECTOMY     Social History   Tobacco Use  .  Smoking status: Former Smoker    Quit date: 06/07/1975    Years since quitting: 45.2  . Smokeless tobacco: Never Used  . Tobacco comment: quit 1976  Substance Use Topics  . Alcohol use: Yes    Alcohol/week: 2.0 - 3.0 standard drinks    Types: 2 - 3 Glasses of wine per week    Comment: occasional wine/alcohol  . Drug use: No   Family History  Problem Relation Age of Onset  . Asthma Mother   . Cancer Father        brain tumor  . Heart disease Maternal Grandfather        MI  . Breast cancer Sister  Allergies  Allergen Reactions  . Penicillins Other (See Comments)    Unsure; was told 40+ years ago: Has patient had a PCN reaction causing immediate rash, facial/tongue/throat swelling, SOB or lightheadedness with hypotension: Unknown Has patient had a PCN reaction causing severe rash involving mucus membranes or skin necrosis: Unknown Has patient had a PCN reaction that required hospitalization: Unknown Has patient had a PCN reaction occurring within the last 10 years: No If all of the above answers are "NO", then may proceed with Cephalosporin use.    Current Outpatient Medications on File Prior to Visit  Medication Sig Dispense Refill  . Calcium Carbonate-Vitamin D (CALCIUM + D PO) Take 1 capsule by mouth 2 (two) times daily.    . Cholecalciferol (VITAMIN D) 1000 UNITS capsule Take 2,000 Units by mouth daily.    . Multiple Vitamin (MULTIVITAMIN) tablet Take 1 tablet by mouth daily.     No current facility-administered medications on file prior to visit.     Review of Systems  Constitutional: Negative for activity change, appetite change, fatigue, fever and unexpected weight change.  HENT: Negative for congestion, ear pain, rhinorrhea, sinus pressure and sore throat.   Eyes: Negative for pain, redness and visual disturbance.  Respiratory: Negative for cough, shortness of breath and wheezing.   Cardiovascular: Negative for chest pain and palpitations.  Gastrointestinal:  Negative for abdominal pain, blood in stool, constipation and diarrhea.  Endocrine: Negative for polydipsia and polyuria.  Genitourinary: Negative for dysuria, frequency and urgency.  Musculoskeletal: Positive for arthralgias. Negative for back pain and myalgias.  Skin: Negative for pallor and rash.  Allergic/Immunologic: Negative for environmental allergies.  Neurological: Negative for dizziness, syncope and headaches.  Hematological: Negative for adenopathy. Does not bruise/bleed easily.  Psychiatric/Behavioral: Negative for decreased concentration and dysphoric mood. The patient is not nervous/anxious.        Objective:   Physical Exam Constitutional:      General: She is not in acute distress.    Appearance: Normal appearance. She is well-developed and normal weight. She is not ill-appearing or diaphoretic.  HENT:     Head: Normocephalic and atraumatic.     Right Ear: Tympanic membrane, ear canal and external ear normal.     Left Ear: Tympanic membrane, ear canal and external ear normal.     Nose: Nose normal. No congestion.     Mouth/Throat:     Mouth: Mucous membranes are moist.     Pharynx: Oropharynx is clear. No posterior oropharyngeal erythema.  Eyes:     General: No scleral icterus.    Extraocular Movements: Extraocular movements intact.     Conjunctiva/sclera: Conjunctivae normal.     Pupils: Pupils are equal, round, and reactive to light.  Neck:     Thyroid: No thyromegaly.     Vascular: No carotid bruit or JVD.  Cardiovascular:     Rate and Rhythm: Normal rate and regular rhythm.     Pulses: Normal pulses.     Heart sounds: Normal heart sounds. No gallop.   Pulmonary:     Effort: Pulmonary effort is normal. No respiratory distress.     Breath sounds: Normal breath sounds. No wheezing.     Comments: Good air exch Chest:     Chest wall: No tenderness.  Abdominal:     General: Bowel sounds are normal. There is no distension or abdominal bruit.     Palpations:  Abdomen is soft. There is no mass.     Tenderness: There is no abdominal  tenderness.     Hernia: No hernia is present.  Genitourinary:    Comments: Breast exam: No mass, nodules, thickening, tenderness, bulging, retraction, inflamation, nipple discharge or skin changes noted.  No axillary or clavicular LA.     Baseline scars noted Musculoskeletal:        General: No tenderness. Normal range of motion.     Cervical back: Normal range of motion and neck supple. No rigidity. No muscular tenderness.     Right lower leg: No edema.     Left lower leg: No edema.     Comments: Borderline kyphosis   Lymphadenopathy:     Cervical: No cervical adenopathy.  Skin:    General: Skin is warm and dry.     Coloration: Skin is not pale.     Findings: No erythema or rash.     Comments: Solar lentigines diffusely   Neurological:     Mental Status: She is alert. Mental status is at baseline.     Cranial Nerves: No cranial nerve deficit.     Motor: No abnormal muscle tone.     Coordination: Coordination normal.     Gait: Gait normal.     Deep Tendon Reflexes: Reflexes are normal and symmetric. Reflexes normal.  Psychiatric:        Mood and Affect: Mood normal.        Cognition and Memory: Cognition and memory normal.           Assessment & Plan:   Problem List Items Addressed This Visit      Musculoskeletal and Integument   Osteopenia    dexa 9/20  No falls or fractures Vit D level stable in 30s- continues this and ca  Exercise reviewed  Re check at 2 y      Contact dermatitis    Keeps triamcinolone on hand for poison ivy        Other   Encounter for Medicare annual wellness exam - Primary    Reviewed health habits including diet and exercise and skin cancer prevention Reviewed appropriate screening tests for age  Also reviewed health mt list, fam hx and immunization status , as well as social and family history   See HPI Labs reviewed  She will call with covid booster date   Mammogram is scheduled next month  cologuard ref done  No falls/fractures , urged to continue supplements and exercise  Adv directive utd  No cognitive concerns Nl hearing screen and utd with vision/eye care        Atypical lobular hyperplasia of breast    History of  Nl breast exam  Mammogram utd  F/u 6 mo for routine breast exam      Hyperlipidemia    Disc goals for lipids and reasons to control them Rev last labs with pt Rev low sat fat diet in detail Stable with high HDL and LDL under 100  Diet controlled       Routine general medical examination at a health care facility    Reviewed health habits including diet and exercise and skin cancer prevention Reviewed appropriate screening tests for age  Also reviewed health mt list, fam hx and immunization status , as well as social and family history   See HPI Labs reviewed  She will call with covid booster date  Mammogram is scheduled next month  cologuard ref done  No falls/fractures , urged to continue supplements and exercise  Adv directive utd  No cognitive concerns Nl hearing  screen and utd with vision/eye care       Elevated glucose    Lab Results  Component Value Date   HGBA1C 5.6 08/22/2020   disc imp of low glycemic diet and wt loss to prevent DM2       Colon cancer screening    cologuard ref done (3 y)      Vitamin D deficiency    Level is stable in 61s Vitamin D level is therapeutic with current supplementation Disc importance of this to bone and overall health

## 2020-08-29 NOTE — Assessment & Plan Note (Signed)
cologuard ref done (3 y)

## 2020-08-29 NOTE — Patient Instructions (Addendum)
We will sign you up for the cologuard program   Keep up the good work with exercise and healthy diet  Wear sun protection   I sent in your topical creams   Take care of yourself   Follow up in 6 months for breast exam

## 2020-08-29 NOTE — Assessment & Plan Note (Signed)
History of  Nl breast exam  Mammogram utd  F/u 6 mo for routine breast exam

## 2020-08-29 NOTE — Assessment & Plan Note (Signed)
Disc goals for lipids and reasons to control them Rev last labs with pt Rev low sat fat diet in detail Stable with high HDL and LDL under 100  Diet controlled

## 2020-08-29 NOTE — Assessment & Plan Note (Signed)
Reviewed health habits including diet and exercise and skin cancer prevention Reviewed appropriate screening tests for age  Also reviewed health mt list, fam hx and immunization status , as well as social and family history   See HPI Labs reviewed  She will call with covid booster date  Mammogram is scheduled next month  cologuard ref done  No falls/fractures , urged to continue supplements and exercise  Adv directive utd  No cognitive concerns Nl hearing screen and utd with vision/eye care

## 2020-08-29 NOTE — Assessment & Plan Note (Signed)
Keeps triamcinolone on hand for poison ivy

## 2020-08-29 NOTE — Assessment & Plan Note (Signed)
Lab Results  Component Value Date   HGBA1C 5.6 08/22/2020   disc imp of low glycemic diet and wt loss to prevent DM2

## 2020-08-29 NOTE — Assessment & Plan Note (Signed)
dexa 9/20  No falls or fractures Vit D level stable in 30s- continues this and ca  Exercise reviewed  Re check at 2 y

## 2020-09-05 DIAGNOSIS — Z1212 Encounter for screening for malignant neoplasm of rectum: Secondary | ICD-10-CM | POA: Diagnosis not present

## 2020-09-05 DIAGNOSIS — Z1211 Encounter for screening for malignant neoplasm of colon: Secondary | ICD-10-CM | POA: Diagnosis not present

## 2020-09-05 LAB — COLOGUARD: Cologuard: NEGATIVE

## 2020-09-13 LAB — COLOGUARD
COLOGUARD: NEGATIVE
Cologuard: NEGATIVE

## 2020-09-13 LAB — EXTERNAL GENERIC LAB PROCEDURE: COLOGUARD: NEGATIVE

## 2020-09-23 ENCOUNTER — Encounter: Payer: Self-pay | Admitting: Family Medicine

## 2020-10-03 ENCOUNTER — Inpatient Hospital Stay: Admission: RE | Admit: 2020-10-03 | Payer: Medicare PPO | Source: Ambulatory Visit

## 2020-11-10 DIAGNOSIS — D225 Melanocytic nevi of trunk: Secondary | ICD-10-CM | POA: Diagnosis not present

## 2020-11-10 DIAGNOSIS — L570 Actinic keratosis: Secondary | ICD-10-CM | POA: Diagnosis not present

## 2020-11-10 DIAGNOSIS — L821 Other seborrheic keratosis: Secondary | ICD-10-CM | POA: Diagnosis not present

## 2020-11-10 DIAGNOSIS — D2271 Melanocytic nevi of right lower limb, including hip: Secondary | ICD-10-CM | POA: Diagnosis not present

## 2020-11-10 DIAGNOSIS — Z85828 Personal history of other malignant neoplasm of skin: Secondary | ICD-10-CM | POA: Diagnosis not present

## 2020-11-22 ENCOUNTER — Other Ambulatory Visit: Payer: Self-pay

## 2020-11-22 ENCOUNTER — Ambulatory Visit
Admission: RE | Admit: 2020-11-22 | Discharge: 2020-11-22 | Disposition: A | Discharge status: Home / Self Care | Payer: Medicare PPO | Source: Ambulatory Visit | Attending: Family Medicine | Admitting: Family Medicine

## 2020-11-22 DIAGNOSIS — Z1231 Encounter for screening mammogram for malignant neoplasm of breast: Secondary | ICD-10-CM

## 2021-02-28 ENCOUNTER — Encounter: Payer: Self-pay | Admitting: Family Medicine

## 2021-02-28 ENCOUNTER — Ambulatory Visit: Payer: Medicare PPO | Admitting: Family Medicine

## 2021-02-28 ENCOUNTER — Other Ambulatory Visit: Payer: Self-pay

## 2021-02-28 VITALS — BP 142/78 | HR 74 | Temp 97.6°F | Ht 60.25 in | Wt 136.0 lb

## 2021-02-28 DIAGNOSIS — N6099 Unspecified benign mammary dysplasia of unspecified breast: Secondary | ICD-10-CM | POA: Diagnosis not present

## 2021-02-28 DIAGNOSIS — Z23 Encounter for immunization: Secondary | ICD-10-CM

## 2021-02-28 MED ORDER — TRIAMCINOLONE ACETONIDE 0.1 % EX CREA: TOPICAL_CREAM | CUTANEOUS | 3 refills | Status: DC

## 2021-02-28 NOTE — Patient Instructions (Addendum)
Flu shot today   Take care of yourself and stay active  Make good food choices  Try to get most of your carbohydrates from produce (with the exception of white potatoes)  Eat less bread/pasta/rice/snack foods/cereals/sweets and other items from the middle of the grocery store (processed carbs)  Breast exam is normal  Keep doing your exams  Mammogram is due in May   I refilled your triamcinolone

## 2021-02-28 NOTE — Assessment & Plan Note (Signed)
Nl 6 month breast exam Reviewed records and last mammogram from may 2022 Enc regular self exams F/u in May for visit and also mammogram

## 2021-02-28 NOTE — Progress Notes (Signed)
Subjective:    Patient ID: Bailey Stephens, female    DOB: 1946-03-12, 75 y.o.   MRN: [id number]  This visit occurred during the SARS-CoV-2 public health emergency.  Safety protocols were in place, including screening questions prior to the visit, additional usage of staff PPE, and extensive cleaning of exam room while observing appropriate contact time as indicated for disinfecting solutions.   HPI Pt presents for follow up and breast exam   Wt Readings from Last 3 Encounters:  02/28/21 136 lb (61.7 kg)  08/29/20 136 lb (61.7 kg)  02/03/19 137 lb 4 oz (62.3 kg)   26.34 kg/m  Feeling good  No breast lumps or nipple d/c    Past h/o atypical lobular hyperplasia of the breast  Diagnosed incidentally from breast tissue after breast reduction surgery in 2015 [facility] Declined tamoxifen  Has a fam h/o breast cancer as well (sister) Gets yearly mammograms  Needs breast exam every 6 months   Normal mammogram 5/22 MM 3D SCREEN BREAST BILATERAL (Accession [id number]) (Order [id number]) Imaging Date: 11/22/2020 Department: [facility] Released By: Wilburn Mylar B Authorizing: Ronell Boldin, Wynelle Fanny, MD   Exam Status  Status  Final [99]   PACS Intelerad Image Link   Show images for MM 3D SCREEN BREAST BILATERAL  Study Result  Narrative & Impression  CLINICAL DATA:  Screening.   EXAM: DIGITAL SCREENING BILATERAL MAMMOGRAM WITH TOMOSYNTHESIS AND CAD   TECHNIQUE: Bilateral screening digital craniocaudal and mediolateral oblique mammograms were obtained. Bilateral screening digital breast tomosynthesis was performed. The images were evaluated with computer-aided detection.   COMPARISON:  Previous exam(s).   ACR Breast Density Category c: The breast tissue is heterogeneously dense, which may obscure small masses.   FINDINGS: There are no findings suspicious for malignancy. The images were evaluated with computer-aided detection.    IMPRESSION: No mammographic evidence of malignancy. A result letter of this screening mammogram will be mailed directly to the patient.   RECOMMENDATION: Screening mammogram in one year. (Code:[id number]-01Y)   BI-RADS CATEGORY  1: Negative.    Wants a flu shot today also  Of note, for colon cancer screening cologuard was neg 3/22   Patient Active Problem List   Diagnosis Date Noted   H/O fracture of rib 02/03/2019   Colon cancer screening 07/26/2017   Vitamin D deficiency 07/26/2017   Elevated glucose 07/21/2017   Contact dermatitis 02/29/2016   Hyperlipidemia 06/02/2015   Routine general medical examination at a health care facility 06/02/2015   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   Osteopenia 07/15/2014   Estrogen deficiency 06/07/2014   Abnormal breast biopsy 06/07/2014   Encounter for Medicare annual wellness exam 05/24/2013   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   Diabetes mellitus screening 02/12/2011   GERD (gastroesophageal reflux disease) 02/12/2011   Past Medical History:  Diagnosis Date   Back pain    due to large breasts   Fever blister    GERD (gastroesophageal reflux disease)    No pertinent past medical history    Overweight(278.02)    Past Surgical History:  Procedure Laterality Date   BREAST REDUCTION SURGERY  12/01/13   BUNIONECTOMY  1988   cataract surgery  2010   CESAREAN SECTION     MASS EXCISION  06/08/2011   Procedure: EXCISION MASS;  Surgeon: Cammie Sickle., MD;  Location: [facility];  Service: Orthopedics;  Laterality: Right;  excisional biopsy of exostosis right thumb   REDUCTION MAMMAPLASTY Bilateral  2014   TONSILLECTOMY     Social History   Tobacco Use   Smoking status: Former    Types: Cigarettes    Quit date: 06/07/1975    Years since quitting: 45.7   Smokeless tobacco: Never   Tobacco comments:    quit 1976  Substance Use Topics   Alcohol use: Yes    Alcohol/week: 2.0 - 3.0 standard  drinks    Types: 2 - 3 Glasses of wine per week    Comment: occasional wine/alcohol   Drug use: No   Family History  Problem Relation Age of Onset   Asthma Mother    Cancer Father        brain tumor   Heart disease Maternal Grandfather        MI   Breast cancer Sister    Allergies  Allergen Reactions   Penicillins Other (See Comments)    Unsure; was told 40+ years ago: Has patient had a PCN reaction causing immediate rash, facial/tongue/throat swelling, SOB or lightheadedness with hypotension: Unknown Has patient had a PCN reaction causing severe rash involving mucus membranes or skin necrosis: Unknown Has patient had a PCN reaction that required hospitalization: Unknown Has patient had a PCN reaction occurring within the last 10 years: No If all of the above answers are "NO", then may proceed with Cephalosporin use.    Current Outpatient Medications on File Prior to Visit  Medication Sig Dispense Refill   Calcium Carbonate-Vitamin D (CALCIUM + D PO) Take 1 capsule by mouth 2 (two) times daily.     Cholecalciferol (VITAMIN D) 1000 UNITS capsule Take 2,000 Units by mouth daily.     estradiol (ESTRACE) 0.1 MG/GM vaginal cream Use 1-2 cm in applicator intravaginally twice a week 42.5 g 3   Multiple Vitamin (MULTIVITAMIN) tablet Take 1 tablet by mouth daily.     No current facility-administered medications on file prior to visit.    Review of Systems  Constitutional:  Negative for activity change, appetite change, fatigue, fever and unexpected weight change.  HENT:  Negative for congestion, ear pain, rhinorrhea, sinus pressure and sore throat.   Eyes:  Negative for pain, redness and visual disturbance.  Respiratory:  Negative for cough, shortness of breath and wheezing.   Cardiovascular:  Negative for chest pain and palpitations.  Gastrointestinal:  Negative for abdominal pain, blood in stool, constipation and diarrhea.  Endocrine: Negative for polydipsia and polyuria.   Genitourinary:  Negative for dysuria, frequency and urgency.  Musculoskeletal:  Negative for arthralgias, back pain and myalgias.  Skin:  Negative for pallor and rash.  Allergic/Immunologic: Negative for environmental allergies.  Neurological:  Negative for dizziness, syncope and headaches.  Hematological:  Negative for adenopathy. Does not bruise/bleed easily.  Psychiatric/Behavioral:  Negative for decreased concentration and dysphoric mood. The patient is not nervous/anxious.       Objective:   Physical Exam Constitutional:      General: She is not in acute distress.    Appearance: Normal appearance. She is normal weight. She is not ill-appearing.  Eyes:     General: No scleral icterus.    Conjunctiva/sclera: Conjunctivae normal.     Pupils: Pupils are equal, round, and reactive to light.  Cardiovascular:     Rate and Rhythm: Normal rate and regular rhythm.  Genitourinary:    Comments: Breast exam: No mass, nodules, thickening, tenderness, bulging, retraction, inflamation, nipple discharge or skin changes noted.  No axillary or clavicular LA.     Scars from past  breast reduction noted Musculoskeletal:     Cervical back: Normal range of motion and neck supple. No rigidity.  Lymphadenopathy:     Cervical: No cervical adenopathy.  Skin:    Findings: No erythema or rash.  Neurological:     Mental Status: She is alert.  Psychiatric:        Mood and Affect: Mood normal.          Assessment & Plan:   Problem List Items Addressed This Visit       Other   Atypical lobular hyperplasia of breast - Primary    Nl 6 month breast exam Reviewed records and last mammogram from may 2022 [facility] regular self exams F/u in May for visit and also mammogram

## 2021-09-25 ENCOUNTER — Telehealth: Payer: Self-pay | Admitting: Family Medicine

## 2021-09-25 NOTE — Telephone Encounter (Signed)
Left message for patient to call back and schedule Medicare Annual Wellness Visit (AWV).  ? ?Please offer to do virtually or by telephone.  ? ?Last AWV: 08/29/2020 ? ?Please schedule at anytime with [facility] Advisor schedule 2 ? ?45 minute appointent ? ?If any questions, please contact me at [phone]  ?

## 2021-10-01 ENCOUNTER — Telehealth: Payer: Self-pay | Admitting: Family Medicine

## 2021-10-01 DIAGNOSIS — Z0000 Encounter for general adult medical examination without abnormal findings: Secondary | ICD-10-CM

## 2021-10-01 DIAGNOSIS — E7800 Pure hypercholesterolemia, unspecified: Secondary | ICD-10-CM

## 2021-10-01 DIAGNOSIS — E559 Vitamin D deficiency, unspecified: Secondary | ICD-10-CM

## 2021-10-01 DIAGNOSIS — R7309 Other abnormal glucose: Secondary | ICD-10-CM

## 2021-10-01 NOTE — Telephone Encounter (Signed)
-----   Message from Ellamae Sia sent at 09/26/2021  2:13 PM EDT ----- ?Regarding: Lab orders for Tuesday, 4.4.23 ?Patient is scheduled for CPX labs, please order future labs, Thanks , Terri ? ? ?

## 2021-10-03 ENCOUNTER — Other Ambulatory Visit (INDEPENDENT_AMBULATORY_CARE_PROVIDER_SITE_OTHER): Payer: Medicare PPO

## 2021-10-03 DIAGNOSIS — R7309 Other abnormal glucose: Secondary | ICD-10-CM | POA: Diagnosis not present

## 2021-10-03 DIAGNOSIS — Z0000 Encounter for general adult medical examination without abnormal findings: Secondary | ICD-10-CM | POA: Diagnosis not present

## 2021-10-03 DIAGNOSIS — E7800 Pure hypercholesterolemia, unspecified: Secondary | ICD-10-CM | POA: Diagnosis not present

## 2021-10-03 DIAGNOSIS — E559 Vitamin D deficiency, unspecified: Secondary | ICD-10-CM | POA: Diagnosis not present

## 2021-10-03 LAB — COMPREHENSIVE METABOLIC PANEL
ALT: 11 U/L (ref 0–35)
AST: 15 U/L (ref 0–37)
Albumin: 4.3 g/dL (ref 3.5–5.2)
Alkaline Phosphatase: 56 U/L (ref 39–117)
BUN: 19 mg/dL (ref 6–23)
CO2: 27 mEq/L (ref 19–32)
Calcium: 9.7 mg/dL (ref 8.4–10.5)
Chloride: 104 mEq/L (ref 96–112)
Creatinine, Ser: 0.79 mg/dL (ref 0.40–1.20)
GFR: 71.82 mL/min (ref >60.00)
Glucose, Bld: 90 mg/dL (ref 70–99)
Potassium: 4 mEq/L (ref 3.5–5.1)
Sodium: 140 mEq/L (ref 135–145)
Total Bilirubin: 0.6 mg/dL (ref 0.2–1.2)
Total Protein: 6.4 g/dL (ref 6.0–8.3)

## 2021-10-03 LAB — CBC WITH DIFFERENTIAL/PLATELET
Basophils Absolute: 0 10*3/uL (ref 0.0–0.1)
Basophils Relative: 0.7 % (ref 0.0–3.0)
Eosinophils Absolute: 0.2 10*3/uL (ref 0.0–0.7)
Eosinophils Relative: 2.4 % (ref 0.0–5.0)
HCT: 39.7 % (ref 36.0–46.0)
Hemoglobin: 13.3 g/dL (ref 12.0–15.0)
Lymphocytes Relative: 25.5 % (ref 12.0–46.0)
Lymphs Abs: 1.7 10*3/uL (ref 0.7–4.0)
MCHC: 33.6 g/dL (ref 30.0–36.0)
MCV: 92.2 fl (ref 78.0–100.0)
Monocytes Absolute: 0.5 10*3/uL (ref 0.1–1.0)
Monocytes Relative: 7.9 % (ref 3.0–12.0)
Neutro Abs: 4.3 10*3/uL (ref 1.4–7.7)
Neutrophils Relative %: 63.5 % (ref 43.0–77.0)
Platelets: 288 10*3/uL (ref 150.0–400.0)
RBC: 4.3 Mil/uL (ref 3.87–5.11)
RDW: 13.2 % (ref 11.5–15.5)
WBC: 6.7 10*3/uL (ref 4.0–10.5)

## 2021-10-03 LAB — LIPID PANEL
Cholesterol: 176 mg/dL (ref 0–200)
HDL: 68.3 mg/dL (ref >39.00)
LDL Cholesterol: 87 mg/dL (ref 0–99)
NonHDL: 107.95
Total CHOL/HDL Ratio: 3
Triglycerides: 107 mg/dL (ref 0.0–149.0)
VLDL: 21.4 mg/dL (ref 0.0–40.0)

## 2021-10-03 LAB — VITAMIN D 25 HYDROXY (VIT D DEFICIENCY, FRACTURES): VITD: 26.72 ng/mL — ABNORMAL LOW (ref 30.00–100.00)

## 2021-10-03 LAB — HEMOGLOBIN A1C: Hgb A1c MFr Bld: 5.8 % (ref 4.6–6.5)

## 2021-10-03 LAB — TSH: TSH: 3.67 u[IU]/mL (ref 0.35–5.50)

## 2021-10-05 ENCOUNTER — Ambulatory Visit (INDEPENDENT_AMBULATORY_CARE_PROVIDER_SITE_OTHER): Payer: Medicare PPO | Admitting: *Deleted

## 2021-10-05 ENCOUNTER — Encounter: Payer: Medicare PPO | Admitting: Family Medicine

## 2021-10-05 DIAGNOSIS — Z780 Asymptomatic menopausal state: Secondary | ICD-10-CM | POA: Diagnosis not present

## 2021-10-05 DIAGNOSIS — Z0000 Encounter for general adult medical examination without abnormal findings: Secondary | ICD-10-CM

## 2021-10-05 NOTE — Patient Instructions (Signed)
Bailey Stephens , ?Thank you for taking time to come for your Medicare Wellness Visit. I appreciate your ongoing commitment to your health goals. Please review the following plan we discussed and let me know if I can assist you in the future.  ? ?Screening recommendations/referrals: ?Colonoscopy: no longer required ?Mammogram: no longer required ?Bone Density: up to date ?Recommended yearly ophthalmology/optometry visit for glaucoma screening and checkup ?Recommended yearly dental visit for hygiene and checkup ? ?Vaccinations: ?Influenza vaccine: up to date ?Pneumococcal vaccine: up to date ?Tdap vaccine: Education provided ?Shingles vaccine: up to date   ? ?Advanced directives: on file ? ?Conditions/risks identified:  ? ?Next appointment: 10-10-2021 @ 10:00   T[facility] ? ? ?Preventive Care 35 Years and Older, Female ?Preventive care refers to lifestyle choices and visits with your health care provider that can promote health and wellness. ?What does preventive care include? ?A yearly physical exam. This is also called an annual well check. ?Dental exams once or twice a year. ?Routine eye exams. Ask your health care provider how often you should have your eyes checked. ?Personal lifestyle choices, including: ?Daily care of your teeth and gums. ?Regular physical activity. ?Eating a healthy diet. ?Avoiding tobacco and drug use. ?Limiting alcohol use. ?Practicing safe sex. ?Taking low-dose aspirin every day. ?Taking vitamin and mineral supplements as recommended by your health care provider. ?What happens during an annual well check? ?The services and screenings done by your health care provider during your annual well check will depend on your age, overall health, lifestyle risk factors, and family history of disease. ?Counseling  ?Your health care provider may ask you questions about your: ?Alcohol use. ?Tobacco use. ?Drug use. ?Emotional well-being. ?Home and relationship well-being. ?Sexual activity. ?Eating  habits. ?History of falls. ?Memory and ability to understand (cognition). ?Work and work Statistician. ?Reproductive health. ?Screening  ?You may have the following tests or measurements: ?Height, weight, and BMI. ?Blood pressure. ?Lipid and cholesterol levels. These may be checked every 5 years, or more frequently if you are over 61 years old. ?Skin check. ?Lung cancer screening. You may have this screening every year starting at age 31 if you have a 30-pack-year history of smoking and currently smoke or have quit within the past 15 years. ?Fecal occult blood test (FOBT) of the stool. You may have this test every year starting at age 74. ?Flexible sigmoidoscopy or colonoscopy. You may have a sigmoidoscopy every 5 years or a colonoscopy every 10 years starting at age 71. ?Hepatitis C blood test. ?Hepatitis B blood test. ?Sexually transmitted disease (STD) testing. ?Diabetes screening. This is done by checking your blood sugar (glucose) after you have not eaten for a while (fasting). You may have this done every 1-3 years. ?Bone density scan. This is done to screen for osteoporosis. You may have this done starting at age 2. ?Mammogram. This may be done every 1-2 years. Talk to your health care provider about how often you should have regular mammograms. ?Talk with your health care provider about your test results, treatment options, and if necessary, the need for more tests. ?Vaccines  ?Your health care provider may recommend certain vaccines, such as: ?Influenza vaccine. This is recommended every year. ?Tetanus, diphtheria, and acellular pertussis (Tdap, Td) vaccine. You may need a Td booster every 10 years. ?Zoster vaccine. You may need this after age 64. ?Pneumococcal 13-valent conjugate (PCV13) vaccine. One dose is recommended after age 21. ?Pneumococcal polysaccharide (PPSV23) vaccine. One dose is recommended after age 63. ?Talk to your health  care provider about which screenings and vaccines you need and how  often you need them. ?This information is not intended to replace advice given to you by your health care provider. Make sure you discuss any questions you have with your health care provider. ?Document Released: 07/15/2015 Document Revised: 03/07/2016 Document Reviewed: 04/19/2015 ?Elsevier Interactive Patient Education ? 2017 [employer]. ? ?Fall Prevention in the Home ?Falls can cause injuries. They can happen to people of all ages. There are many things you can do to make your home safe and to help prevent falls. ?What can I do on the outside of my home? ?Regularly fix the edges of walkways and driveways and fix any cracks. ?Remove anything that might make you trip as you walk through a door, such as a raised step or threshold. ?Trim any bushes or trees on the path to your home. ?Use bright outdoor lighting. ?Clear any walking paths of anything that might make someone trip, such as rocks or tools. ?Regularly check to see if handrails are loose or broken. Make sure that both sides of any steps have handrails. ?Any raised decks and porches should have guardrails on the edges. ?Have any leaves, snow, or ice cleared regularly. ?Use sand or salt on walking paths during winter. ?Clean up any spills in your garage right away. This includes oil or grease spills. ?What can I do in the bathroom? ?Use night lights. ?Install grab bars by the toilet and in the tub and shower. Do not use towel bars as grab bars. ?Use non-skid mats or decals in the tub or shower. ?If you need to sit down in the shower, use a plastic, non-slip stool. ?Keep the floor dry. Clean up any water that spills on the floor as soon as it happens. ?Remove soap buildup in the tub or shower regularly. ?Attach bath mats securely with double-sided non-slip rug tape. ?Do not have throw rugs and other things on the floor that can make you trip. ?What can I do in the bedroom? ?Use night lights. ?Make sure that you have a light by your bed that is easy to  reach. ?Do not use any sheets or blankets that are too big for your bed. They should not hang down onto the floor. ?Have a firm chair that has side arms. You can use this for support while you get dressed. ?Do not have throw rugs and other things on the floor that can make you trip. ?What can I do in the kitchen? ?Clean up any spills right away. ?Avoid walking on wet floors. ?Keep items that you use a lot in easy-to-reach places. ?If you need to reach something above you, use a strong step stool that has a grab bar. ?Keep electrical cords out of the way. ?Do not use floor polish or wax that makes floors slippery. If you must use wax, use non-skid floor wax. ?Do not have throw rugs and other things on the floor that can make you trip. ?What can I do with my stairs? ?Do not leave any items on the stairs. ?Make sure that there are handrails on both sides of the stairs and use them. Fix handrails that are broken or loose. Make sure that handrails are as long as the stairways. ?Check any carpeting to make sure that it is firmly attached to the stairs. Fix any carpet that is loose or worn. ?Avoid having throw rugs at the top or bottom of the stairs. If you do have throw rugs, attach them to  the floor with carpet tape. ?Make sure that you have a light switch at the top of the stairs and the bottom of the stairs. If you do not have them, ask someone to add them for you. ?What else can I do to help prevent falls? ?Wear shoes that: ?Do not have high heels. ?Have rubber bottoms. ?Are comfortable and fit you well. ?Are closed at the toe. Do not wear sandals. ?If you use a stepladder: ?Make sure that it is fully opened. Do not climb a closed stepladder. ?Make sure that both sides of the stepladder are locked into place. ?Ask someone to hold it for you, if possible. ?Clearly mark and make sure that you can see: ?Any grab bars or handrails. ?First and last steps. ?Where the edge of each step is. ?Use tools that help you move  around (mobility aids) if they are needed. These include: ?Canes. ?Walkers. ?Scooters. ?Crutches. ?Turn on the lights when you go into a dark area. Replace any light bulbs as soon as they burn out. ?Set up your furniture so you

## 2021-10-05 NOTE — Progress Notes (Signed)
? ?Subjective:  ? Bailey Stephens is a 76 y.o. female who presents for Medicare Annual (Subsequent) preventive examination. ? ?I connected with  [name] on 10/05/21 by a telephone enabled telemedicine application and verified that I am speaking with the correct person using two identifiers. ?  ?I discussed the limitations of evaluation and management by telemedicine. The patient expressed understanding and agreed to proceed. ? ?Patient location: home ? ?Provider location:  Tele-Health not in office ? ? ? ?Review of Systems    ? ?Cardiac Risk Factors include: advanced age (>45mn, >>44women);family history of premature cardiovascular disease ? ?   ?Objective:  ?  ?Today's Vitals  ? ?There is no height or weight on file to calculate BMI. ? ? ?  10/05/2021  ? 11:13 AM 08/14/2019  ? 10:30 AM 01/25/2019  ?  9:36 PM 07/23/2017  ?  8:28 AM 05/06/2017  ?  1:00 PM 09/08/2016  ?  2:50 PM 07/23/2016  ?  1:58 PM  ?Advanced Directives  ?Does Patient Have a Medical Advance Directive? Yes Yes No Yes No No Yes  ?Type of AParamedicof [place]Living will  [place] will   [place]  ?Copy of [place]? Yes - validated most recent copy scanned in chart (See row information) No - copy requested  No - copy requested   No - copy requested  ?Would patient like information on creating a medical advance directive?   No - Patient declined      ? ? ?Current Medications (verified) ?Outpatient Encounter Medications as of 10/05/2021  ?Medication Sig  ? Calcium Carbonate-Vitamin D (CALCIUM + D PO) Take 1 capsule by mouth 2 (two) times daily.  ? Cholecalciferol (VITAMIN D) 1000 UNITS capsule Take 2,000 Units by mouth daily.  ? estradiol (ESTRACE) 0.1 MG/GM vaginal cream Use 1-2 cm in applicator intravaginally twice a week  ? Multiple Vitamin (MULTIVITAMIN) tablet Take 1 tablet by mouth daily.  ?  triamcinolone cream (KENALOG) 0.1 % APPLY TOPICALLYTO THE AFFECTED AREAS 2 (TWO) TIMES DAILY AS NEEDED for poison ivy  ? ?No facility-administered encounter medications on file as of 10/05/2021.  ? ? ?Allergies (verified) ?Penicillins  ? ?History: ?Past Medical History:  ?Diagnosis Date  ? Back pain   ? due to large breasts  ? Fever blister   ? GERD (gastroesophageal reflux disease)   ? No pertinent past medical history   ? Overweight(278.02)   ? ?Past Surgical History:  ?Procedure Laterality Date  ? BREAST REDUCTION SURGERY  12/01/13  ? BUNIONECTOMY  1988  ? cataract surgery  2010  ? CESAREAN SECTION    ? MASS EXCISION  06/08/2011  ? Procedure: EXCISION MASS;  Surgeon: RCammie Sickle, MD;  [place] [facility]  Service: Orthopedics;  Laterality: Right;  excisional biopsy of exostosis right thumb  ? REDUCTION MAMMAPLASTY Bilateral 2014  ? TONSILLECTOMY    ? ?Family History  ?Problem Relation Age of Onset  ? Asthma Mother   ? Cancer Father   ?     brain tumor  ? Heart disease Maternal Grandfather   ?     MI  ? Breast cancer Sister   ? ?Social History  ? ?Socioeconomic History  ? Marital status: Married  ?  Spouse name: Not on file  ? Number of children: Not on file  ? Years of education: Not on file  ?  Highest education level: Not on file  ?Occupational History  ? Not on file  ?Tobacco Use  ? Smoking status: Former  ?  Types: Cigarettes  ?  Quit date: 06/07/1975  ?  Years since quitting: 46.3  ? Smokeless tobacco: Never  ? Tobacco comments:  ?  quit 1976  ?Substance and Sexual Activity  ? Alcohol use: Yes  ?  Alcohol/week: 2.0 - 3.0 standard drinks  ?  Types: 2 - 3 Glasses of wine per week  ?  Comment: occasional wine/alcohol  ? Drug use: No  ? Sexual activity: Yes  ?  Birth control/protection: None  ?  Comment: Married  ?Other Topics Concern  ? Not on file  ?Social History Narrative  ? Not on file  ? ?Social Determinants of Health  ? ?Financial Resource Strain: Low Risk   ? Difficulty of Paying Living  Expenses: Not hard at all  ?Food Insecurity: No Food Insecurity  ? Worried About Charity fundraiser in the Last Year: Never true  ? Ran Out of Food in the Last Year: Never true  ?Transportation Needs: No Transportation Needs  ? Lack of Transportation (Medical): No  ? Lack of Transportation (Non-Medical): No  ?Physical Activity: Sufficiently Active  ? Days of Exercise per Week: 5 days  ? Minutes of Exercise per Session: 40 min  ?Stress: No Stress Concern Present  ? Feeling of Stress : Not at all  ?Social Connections: Moderately Integrated  ? Frequency of Communication with Friends and Family: More than three times a week  ? Frequency of Social Gatherings with Friends and Family: More than three times a week  ? Attends Religious Services: Never  ? Active Member of Clubs or Organizations: Yes  ? Attends Archivist Meetings: More than 4 times per year  ? Marital Status: Married  ? ? ?Tobacco Counseling ?Counseling given: Not Answered ?Tobacco comments: quit 1976 ? ? ?Clinical Intake: ? ?Pre-visit preparation completed: Yes ? ?Pain : No/denies pain ? ?  ? ?Nutritional Risks: None ?Diabetes: No ? ?How often do you need to have someone help you when you read instructions, pamphlets, or other written materials from your doctor or pharmacy?: 1 - Never ? ?Diabetic?no ? ?Interpreter Needed?: No ? ?Information entered by :: Leroy Kennedy LPN ? ? ?Activities of Daily Living ? ?  10/05/2021  ? 11:23 AM 10/05/2021  ? 11:14 AM  ?In your present state of health, do you have any difficulty performing the following activities:  ?Hearing? 0   ?Vision? 0 0  ?Difficulty concentrating or making decisions? 0 0  ?Walking or climbing stairs? 0 0  ?Dressing or bathing? 0 0  ?Doing errands, shopping? 0 0  ?Preparing Food and eating ? N N  ?Using the Toilet? N N  ?In the past six months, have you accidently leaked urine? N N  ?Do you have problems with loss of bowel control? N N  ?Managing your Medications? N N  ?Managing your Finances? N  N  ?Housekeeping or managing your Housekeeping? N N  ? ? ?Patient Care Team: ?Tower, Wynelle Fanny, MD as PCP - General ?Shon Hough, MD as Consulting Physician (Ophthalmology) ? ?Indicate any recent Medical Services you may have received from other than [facility] providers in the past year (date may be approximate). ? ?   ?Assessment:  ? This is a routine wellness examination for Bailey Stephens. ? ?Hearing/Vision screen ?Hearing Screening - Comments:: No trouble hearing ?Vision Screening - Comments:: Up to date  ?  [place] ? ?Dietary issues and exercise activities discussed: ?Current Exercise Habits: Home exercise routine, Type of exercise: walking, Time (Minutes): 40, Frequency (Times/Week): 5, Weekly Exercise (Minutes/Week): 200, Intensity: Mild ? ? Goals Addressed   ? ?  ?  ?  ?  ? This Visit's Progress  ?  Weight (lb) < 200 lb (90.7 kg)     ?  Like to loose 5 pounds ?  ? ?  ? ?Depression Screen ? ?  10/05/2021  ? 11:17 AM 08/29/2020  ? 11:29 AM 08/26/2019  ?  4:19 PM 08/14/2019  ? 10:32 AM 08/11/2018  ?  9:13 AM 07/23/2017  ?  8:26 AM 07/23/2016  ?  1:47 PM  ?PHQ 2/9 Scores  ?PHQ - 2 Score 0 0 0 0 0 0 0  ?PHQ- 9 Score   0 0  0   ?  ?Fall Risk ? ?  10/05/2021  ? 11:13 AM 08/29/2020  ? 11:29 AM 08/14/2019  ? 10:30 AM 08/11/2018  ?  9:12 AM 07/23/2017  ?  8:26 AM  ?Fall Risk   ?Falls in the past year? 0 0 1 0 Yes  ?Comment   tripped on steps and fell  accidental fall; fell backwards off step; stitches to back of head  ?Number falls in past yr: 0  0 0 1  ?Injury with Fall? 0  1  Yes  ?Comment   broke 2 ribs    ?Risk for fall due to :   History of fall(s)    ?Follow up Education provided;Falls evaluation completed Falls evaluation completed Falls evaluation completed;Falls prevention discussed    ? ? ?FALL RISK PREVENTION PERTAINING TO THE HOME: ? ?Any stairs in or around the home? Yes  ?If so, are there any without handrails? No  ?Home free of loose throw rugs in walkways, pet beds, electrical cords, etc? Yes  ?Adequate lighting in your  home to reduce risk of falls? Yes  ? ?ASSISTIVE DEVICES UTILIZED TO PREVENT FALLS: ? ?Life alert? No  ?Use of a cane, walker or w/c? No  ?Grab bars in the bathroom? No  ?Shower chair or bench in shower? Yes  ?[place]

## 2021-10-10 ENCOUNTER — Ambulatory Visit (INDEPENDENT_AMBULATORY_CARE_PROVIDER_SITE_OTHER): Payer: Medicare PPO | Admitting: Family Medicine

## 2021-10-10 ENCOUNTER — Encounter: Payer: Self-pay | Admitting: Family Medicine

## 2021-10-10 VITALS — BP 131/70 | HR 65 | Temp 97.9°F | Ht 59.75 in | Wt 137.5 lb

## 2021-10-10 DIAGNOSIS — G8929 Other chronic pain: Secondary | ICD-10-CM

## 2021-10-10 DIAGNOSIS — Z1211 Encounter for screening for malignant neoplasm of colon: Secondary | ICD-10-CM | POA: Diagnosis not present

## 2021-10-10 DIAGNOSIS — Z0000 Encounter for general adult medical examination without abnormal findings: Secondary | ICD-10-CM

## 2021-10-10 DIAGNOSIS — E2839 Other primary ovarian failure: Secondary | ICD-10-CM | POA: Diagnosis not present

## 2021-10-10 DIAGNOSIS — R7309 Other abnormal glucose: Secondary | ICD-10-CM | POA: Diagnosis not present

## 2021-10-10 DIAGNOSIS — M25561 Pain in right knee: Secondary | ICD-10-CM

## 2021-10-10 DIAGNOSIS — M8588 Other specified disorders of bone density and structure, other site: Secondary | ICD-10-CM

## 2021-10-10 DIAGNOSIS — E559 Vitamin D deficiency, unspecified: Secondary | ICD-10-CM | POA: Diagnosis not present

## 2021-10-10 DIAGNOSIS — E7800 Pure hypercholesterolemia, unspecified: Secondary | ICD-10-CM | POA: Diagnosis not present

## 2021-10-10 DIAGNOSIS — M25562 Pain in left knee: Secondary | ICD-10-CM | POA: Insufficient documentation

## 2021-10-10 DIAGNOSIS — N6099 Unspecified benign mammary dysplasia of unspecified breast: Secondary | ICD-10-CM

## 2021-10-10 DIAGNOSIS — N952 Postmenopausal atrophic vaginitis: Secondary | ICD-10-CM

## 2021-10-10 MED ORDER — ESTRADIOL 0.1 MG/GM VA CREA: TOPICAL_CREAM | VAGINAL | 3 refills | Status: DC

## 2021-10-10 NOTE — Progress Notes (Signed)
? ?Subjective:  ? ? Patient ID: Bailey Stephens, female    DOB: 28-Jan-1946, 76 y.o.   MRN: [id number] ? ?HPI ?Here for health maintenance exam and to review chronic medical problems   ? ?Had amw, reviewed  ? ?Wt Readings from Last 3 Encounters:  ?10/10/21 137 lb 8 oz (62.4 kg)  ?02/28/21 136 lb (61.7 kg)  ?08/29/20 136 lb (61.7 kg)  ? ?27.08 kg/m? ? ?Mammogram 10/2020  ?Self breast exam : no lumps  ?H/o atypical lobular hyperplasia of breast in past  ? ?Td postponed for ins  ?Flu shot in fall  ?Pna vaccines utd  ?Had shingrix  ? ?Dexa 03/2019-due for this ?Osteopenia  ?Falls-none ?Fractures -none  ?Takes vit D, level was lower at 26.7  ? ?Colon cancer screening 08/2020 negative  ? ? ?BP Readings from Last 3 Encounters:  ?10/10/21 131/70  ?02/28/21 (!) 142/78  ?08/29/20 122/78  ? ? ?Pulse Readings from Last 3 Encounters:  ?10/10/21 65  ?02/28/21 74  ?08/29/20 73  ? ?Knees are killing her  ?Has gained weight due to less exercise  ?Ran marathons in the past  ?Both hurt the same  ?Thinks she has arthritis  ?Takes tylenol occ  ?Has not used voltaren gel  ? ?Had to quit walking  ? ?Other than that she is doing great ?Feeling her age  ? ? ?H/o elevated glucose ?Lab Results  ?Component Value Date  ? HGBA1C 5.8 10/03/2021  ? ?Tries not to eat a lot of sugar  ? ?Hyperlipidemia ?Lab Results  ?Component Value Date  ? CHOL 176 10/03/2021  ? CHOL 194 08/22/2020  ? CHOL 155 08/14/2019  ? ?Lab Results  ?Component Value Date  ? HDL 68.30 10/03/2021  ? HDL 75.40 08/22/2020  ? HDL 67.50 08/14/2019  ? ?Lab Results  ?Component Value Date  ? [place] 87 10/03/2021  ? [place] 92 08/22/2020  ? [place] 65 08/14/2019  ? ?Lab Results  ?Component Value Date  ? TRIG 107.0 10/03/2021  ? TRIG 135.0 08/22/2020  ? TRIG 117.0 08/14/2019  ? ?Lab Results  ?Component Value Date  ? CHOLHDL 3 10/03/2021  ? CHOLHDL 3 08/22/2020  ? CHOLHDL 2 08/14/2019  ? ?No results found for: LDLDIRECT ? ?Lab Results  ?Component Value Date  ? CREATININE 0.79 10/03/2021  ?  BUN 19 10/03/2021  ? NA 140 10/03/2021  ? K 4.0 10/03/2021  ? CL 104 10/03/2021  ? CO2 27 10/03/2021  ? ?Lab Results  ?Component Value Date  ? ALT 11 10/03/2021  ? AST 15 10/03/2021  ? ALKPHOS 56 10/03/2021  ? BILITOT 0.6 10/03/2021  ?  ?Lab Results  ?Component Value Date  ? WBC 6.7 10/03/2021  ? HGB 13.3 10/03/2021  ? HCT 39.7 10/03/2021  ? MCV 92.2 10/03/2021  ? PLT 288.0 10/03/2021  ? ?Lab Results  ?Component Value Date  ? TSH 3.67 10/03/2021  ? ? ?Patient Active Problem List  ? Diagnosis Date Noted  ? Bilateral knee pain 10/10/2021  ? H/O fracture of rib 02/03/2019  ? Colon cancer screening 07/26/2017  ? Vitamin D deficiency 07/26/2017  ? Elevated glucose 07/21/2017  ? Contact dermatitis 02/29/2016  ? Hyperlipidemia 06/02/2015  ? Routine general medical examination at a health care facility 06/02/2015  ? 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  ? Osteopenia 07/15/2014  ? Estrogen deficiency 06/07/2014  ? Abnormal breast biopsy 06/07/2014  ? Encounter for Medicare annual wellness exam 05/24/2013  ? 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  ? Diabetes mellitus  screening 02/12/2011  ? ?Past Medical History:  ?Diagnosis Date  ? Back pain   ? due to large breasts  ? Fever blister   ? GERD (gastroesophageal reflux disease)   ? No pertinent past medical history   ? Overweight(278.02)   ? ?Past Surgical History:  ?Procedure Laterality Date  ? BREAST REDUCTION SURGERY  12/01/13  ? BUNIONECTOMY  1988  ? cataract surgery  2010  ? CESAREAN SECTION    ? MASS EXCISION  06/08/2011  ? Procedure: EXCISION MASS;  Surgeon: Cammie Sickle., MD;  Location: [facility];  Service: Orthopedics;  Laterality: Right;  excisional biopsy of exostosis right thumb  ? REDUCTION MAMMAPLASTY Bilateral 2014  ? TONSILLECTOMY    ? ?Social History  ? ?Tobacco Use  ? Smoking status: Former  ?  Types: Cigarettes  ?  Quit date: 06/07/1975  ?  Years since quitting: 46.3  ? Smokeless tobacco: Never  ? Tobacco comments:  ?   quit 1976  ?Substance Use Topics  ? Alcohol use: Yes  ?  Alcohol/week: 2.0 - 3.0 standard drinks  ?  Types: 2 - 3 Glasses of wine per week  ?  Comment: occasional wine/alcohol  ? Drug use: No  ? ?Family History  ?Problem Relation Age of Onset  ? Asthma Mother   ? Cancer Father   ?     brain tumor  ? Heart disease Maternal Grandfather   ?     MI  ? Breast cancer Sister   ? ?Allergies  ?Allergen Reactions  ? Penicillins Other (See Comments)  ?  Unsure; was told 40+ years ago: ?Has patient had a PCN reaction causing immediate rash, facial/tongue/throat swelling, SOB or lightheadedness with hypotension: Unknown ?Has patient had a PCN reaction causing severe rash involving mucus membranes or skin necrosis: Unknown ?Has patient had a PCN reaction that required hospitalization: Unknown ?Has patient had a PCN reaction occurring within the last 10 years: No ?If all of the above answers are "NO", then may proceed with Cephalosporin use. ?  ? ?Current Outpatient Medications on File Prior to Visit  ?Medication Sig Dispense Refill  ? Calcium Carbonate-Vitamin D (CALCIUM + D PO) Take 1 capsule by mouth 2 (two) times daily.    ? Cholecalciferol (VITAMIN D) 1000 UNITS capsule Take 2,000 Units by mouth daily.    ? Multiple Vitamin (MULTIVITAMIN) tablet Take 1 tablet by mouth daily.    ? triamcinolone cream (KENALOG) 0.1 % APPLY TOPICALLYTO THE AFFECTED AREAS 2 (TWO) TIMES DAILY AS NEEDED for poison ivy 30 g 3  ? ?No current facility-administered medications on file prior to visit.  ?  ?Review of Systems  ?Constitutional:  Negative for activity change, appetite change, fatigue, fever and unexpected weight change.  ?HENT:  Negative for congestion, ear pain, rhinorrhea, sinus pressure and sore throat.   ?Eyes:  Negative for pain, redness and visual disturbance.  ?Respiratory:  Negative for cough, shortness of breath and wheezing.   ?Cardiovascular:  Negative for chest pain and palpitations.  ?Gastrointestinal:  Negative for abdominal  pain, blood in stool, constipation and diarrhea.  ?Endocrine: Negative for polydipsia and polyuria.  ?Genitourinary:  Negative for dysuria, frequency and urgency.  ?Musculoskeletal:  Positive for arthralgias. Negative for back pain and myalgias.  ?     Significant knee pain bilateral  ?Skin:  Negative for pallor and rash.  ?Allergic/Immunologic: Negative for environmental allergies.  ?Neurological:  Negative for dizziness, syncope and headaches.  ?Hematological:  Negative for  adenopathy. Does not bruise/bleed easily.  ?Psychiatric/Behavioral:  Negative for decreased concentration and dysphoric mood. The patient is not nervous/anxious.   ? ?   ?Objective:  ? Physical Exam ?Constitutional:   ?   General: She is not in acute distress. ?   Appearance: Normal appearance. She is well-developed and normal weight. She is not ill-appearing or diaphoretic.  ?HENT:  ?   Head: Normocephalic and atraumatic.  ?   Right Ear: Tympanic membrane, ear canal and external ear normal.  ?   Left Ear: Tympanic membrane, ear canal and external ear normal.  ?   Nose: Nose normal. No congestion.  ?   Mouth/Throat:  ?   Mouth: Mucous membranes are moist.  ?   Pharynx: Oropharynx is clear. No posterior oropharyngeal erythema.  ?Eyes:  ?   General: No scleral icterus. ?   Extraocular Movements: Extraocular movements intact.  ?   Conjunctiva/sclera: Conjunctivae normal.  ?   Pupils: Pupils are equal, round, and reactive to light.  ?Neck:  ?   Thyroid: No thyromegaly.  ?   Vascular: No carotid bruit or JVD.  ?Cardiovascular:  ?   Rate and Rhythm: Normal rate and regular rhythm.  ?   Pulses: Normal pulses.  ?   Heart sounds: Normal heart sounds.  ?  No gallop.  ?Pulmonary:  ?   Effort: Pulmonary effort is normal. No respiratory distress.  ?   Breath sounds: Normal breath sounds. No wheezing.  ?   Comments: Good air exch ?Chest:  ?   Chest wall: No tenderness.  ?Abdominal:  ?   General: Bowel sounds are normal. There is no distension or abdominal  bruit.  ?   Palpations: Abdomen is soft. There is no mass.  ?   Tenderness: There is no abdominal tenderness.  ?   Hernia: No hernia is present.  ?Genitourinary: ?   Comments: Breast exam: No mass, nodules, t

## 2021-10-10 NOTE — Assessment & Plan Note (Signed)
cologuard neg from 08/2020 ?

## 2021-10-10 NOTE — Assessment & Plan Note (Signed)
Level of 26  ?Discussed importance of D for bone and overall health ?Will inc her dose of D3 to 2000 iu daily  ?

## 2021-10-10 NOTE — Assessment & Plan Note (Signed)
Due for 2 y dexa/ordered  ?No falls or fractures  ?Needs to inc vit D3 to 2000 iu daily  ?Level is mildly low at 26 ? ?

## 2021-10-10 NOTE — Assessment & Plan Note (Signed)
Disc goals for lipids and reasons to control them ?Rev last labs with pt ?Rev low sat fat diet in detail ?Good control with diet  ?LDL of 87 ?

## 2021-10-10 NOTE — Patient Instructions (Addendum)
Let's consider some xray of you knees if they don't improve  ? ? ?Get voltaren gel 1% over the counter for knees to try  ? ?Bike and water exercise are best for your knees  ? ?Call and schedule your mammogram and bone density test for May ? ?You need vitamin D3    2000 iu daily  ? ? ? ? ? ?

## 2021-10-10 NOTE — Assessment & Plan Note (Signed)
Past marathon runner ?Had to stop walking  ? ?Adv to try voltaren gel ?Tylenol prn  ?If not improved plan xrays ?

## 2021-10-10 NOTE — Assessment & Plan Note (Signed)
Reviewed mammogram 10/2020  ?Nl exam today  ? ?Will get mammogram in may  ?Continue self exams ?F/u 6 mo for re check ?

## 2021-10-10 NOTE — Assessment & Plan Note (Signed)
Lab Results  ?Component Value Date  ? HGBA1C 5.8 10/03/2021  ? ?disc imp of low glycemic diet and wt loss to prevent DM2  ?Less exercise due to knee pain recently ?

## 2021-10-10 NOTE — Assessment & Plan Note (Signed)
Estrace vaginal cream twice weekly is helpful ?0.1 mg/gm ?No complaints  ?Refilled  ?

## 2021-10-10 NOTE — Assessment & Plan Note (Signed)
Reviewed health habits including diet and exercise and skin cancer prevention ?Reviewed appropriate screening tests for age  ?Also reviewed health mt list, fam hx and immunization status , as well as social and family history   ?amw reviewed  ?Pt will call [facility]  ?Td postponed for ins  ?Other vaccines utd  ?No falls or fractures ?Discussed vit D intake  ?cologuard neg from 08/2020  ? ?

## 2021-10-12 ENCOUNTER — Other Ambulatory Visit: Payer: Medicare PPO

## 2021-10-12 ENCOUNTER — Telehealth: Payer: Self-pay | Admitting: Family Medicine

## 2021-10-12 DIAGNOSIS — G8929 Other chronic pain: Secondary | ICD-10-CM

## 2021-10-12 NOTE — Telephone Encounter (Signed)
Pt notified xray orders are in and no appt needed  ?

## 2021-10-12 NOTE — Telephone Encounter (Signed)
I ordered knee xrays ? ?She can call to schedule time to come in for xray only ?

## 2021-10-19 ENCOUNTER — Ambulatory Visit (INDEPENDENT_AMBULATORY_CARE_PROVIDER_SITE_OTHER)
Admission: RE | Admit: 2021-10-19 | Discharge: 2021-10-19 | Disposition: A | Discharge status: Home / Self Care | Payer: Medicare PPO | Source: Ambulatory Visit | Attending: Family Medicine | Admitting: Family Medicine

## 2021-10-19 DIAGNOSIS — M25561 Pain in right knee: Secondary | ICD-10-CM | POA: Diagnosis not present

## 2021-10-19 DIAGNOSIS — G8929 Other chronic pain: Secondary | ICD-10-CM | POA: Diagnosis not present

## 2021-10-19 DIAGNOSIS — L821 Other seborrheic keratosis: Secondary | ICD-10-CM | POA: Diagnosis not present

## 2021-10-19 DIAGNOSIS — L820 Inflamed seborrheic keratosis: Secondary | ICD-10-CM | POA: Diagnosis not present

## 2021-10-19 DIAGNOSIS — M25562 Pain in left knee: Secondary | ICD-10-CM | POA: Diagnosis not present

## 2021-10-19 DIAGNOSIS — Z85828 Personal history of other malignant neoplasm of skin: Secondary | ICD-10-CM | POA: Diagnosis not present

## 2021-10-20 DIAGNOSIS — H524 Presbyopia: Secondary | ICD-10-CM | POA: Diagnosis not present

## 2021-10-20 DIAGNOSIS — Z961 Presence of intraocular lens: Secondary | ICD-10-CM | POA: Diagnosis not present

## 2021-10-24 ENCOUNTER — Telehealth: Payer: Self-pay | Admitting: Family Medicine

## 2021-10-24 DIAGNOSIS — G8929 Other chronic pain: Secondary | ICD-10-CM

## 2021-10-24 NOTE — Telephone Encounter (Signed)
-----   Message from [name], [place] sent at 10/24/2021 12:26 PM EDT ----- ?Pt notified of xray results and Dr. Marliss Coots comments. Pt agrees with ortho referral, she would like to see someone in [place]. I advised referrals can take up to 2 weeks so if she hasn't heard anything in 2 weeks to let us know  ?

## 2021-10-24 NOTE — Assessment & Plan Note (Signed)
OA on xray  ?Ref to ortho  ?

## 2021-11-23 ENCOUNTER — Ambulatory Visit: Payer: Medicare PPO | Admitting: Orthopedic Surgery

## 2021-11-28 ENCOUNTER — Telehealth: Payer: Self-pay | Admitting: Family Medicine

## 2021-11-28 NOTE — Telephone Encounter (Signed)
Pt called and said she wanted to send a note to Dr Glori Bickers, She said she might have a yeast infection and wanted a prescription, I scheduled her for an appointment. We didn't have anything today and she didn't want to be seen somewhere else so she scheduled with Dr Glori Bickers for tomorrow.

## 2021-11-29 ENCOUNTER — Encounter: Payer: Self-pay | Admitting: Family Medicine

## 2021-11-29 ENCOUNTER — Ambulatory Visit: Payer: Medicare PPO | Admitting: Family Medicine

## 2021-11-29 VITALS — BP 132/78 | HR 78 | Temp 97.8°F | Ht 59.75 in | Wt 136.0 lb

## 2021-11-29 DIAGNOSIS — N3000 Acute cystitis without hematuria: Secondary | ICD-10-CM | POA: Insufficient documentation

## 2021-11-29 DIAGNOSIS — N3001 Acute cystitis with hematuria: Secondary | ICD-10-CM | POA: Diagnosis not present

## 2021-11-29 DIAGNOSIS — N898 Other specified noninflammatory disorders of vagina: Secondary | ICD-10-CM | POA: Diagnosis not present

## 2021-11-29 DIAGNOSIS — R300 Dysuria: Secondary | ICD-10-CM | POA: Diagnosis not present

## 2021-11-29 LAB — POC URINALSYSI DIPSTICK (AUTOMATED)
Bilirubin, UA: NEGATIVE
Blood, UA: 50
Glucose, UA: NEGATIVE
Ketones, UA: NEGATIVE
Nitrite, UA: NEGATIVE
Protein, UA: POSITIVE — AB
Spec Grav, UA: >=1.03 — AB (ref 1.010–1.025)
Urobilinogen, UA: 0.2 E.U./dL
pH, UA: 5.5 (ref 5.0–8.0)

## 2021-11-29 LAB — POCT WET PREP (WET MOUNT): Trichomonas Wet Prep HPF POC: ABSENT

## 2021-11-29 MED ORDER — TRIAMCINOLONE ACETONIDE 0.1 % EX CREA: TOPICAL_CREAM | CUTANEOUS | 3 refills | Status: DC

## 2021-11-29 MED ORDER — SULFAMETHOXAZOLE-TRIMETHOPRIM 800-160 MG PO TABS: 1.0000 | ORAL_TABLET | Freq: Two times a day (BID) | ORAL | 0 refills | Status: DC

## 2021-11-29 NOTE — Assessment & Plan Note (Signed)
ua pos /voiding symptm Px bactrim  Fluids cx pending  ER prec  Handout given  Disc prevention  Update if not starting to improve in a week or if worsening

## 2021-11-29 NOTE — Patient Instructions (Signed)
I think you have a uti  Take the bactrim as directed  We will contact you with the urine culture result   If symptoms suddenly worsen let us know    When you are better from uti let us know if you still have vaginal symptoms  Eat yogurt several times daily if you can (with active cultures)

## 2021-11-29 NOTE — Assessment & Plan Note (Signed)
Few clue cells on wet prep Nl exam  Will wait to tx until uti tx is done if still symptomatic   At risk for yeast upcoming

## 2021-11-29 NOTE — Progress Notes (Signed)
Subjective:    Patient ID: Bailey Stephens, female    DOB: [birth year], 76 y.o.   MRN: [id number]  HPI Pt presents for urinary symptoms and vaginal itching   Needs refill of traimcinolone for poison ivy   Wt Readings from Last 3 Encounters:  11/29/21 136 lb (61.7 kg)  10/10/21 137 lb 8 oz (62.4 kg)  02/28/21 136 lb (61.7 kg)   26.78 kg/m  Symptoms started Saturday when she had to urinate  Got some otc medicine - azo  Improved -then came back   Then vaginal itching , no pain  No discharge  A little scratching    Some blood in urine -pink/flight  Had urgency and frequency  Had a little pain to urinate   No trouble emptying  No fever  No n/v   Results for orders placed or performed in visit on 11/29/21  POCT Urinalysis Dipstick (Automated)  Result Value Ref Range   Color, UA Yellow    Clarity, UA Cloudy    Glucose, UA Negative Negative   Bilirubin, UA Negative    Ketones, UA Negative    Spec Grav, UA >=1.030 (A) 1.010 - 1.025   Blood, UA 50 Ery/uL    pH, UA 5.5 5.0 - 8.0   Protein, UA Positive (A) Negative   Urobilinogen, UA 0.2 0.2 or 1.0 E.U./dL   Nitrite, UA Negative    Leukocytes, UA Large (3+) (A) Negative  POCT Wet Prep ([place] [place])  Result Value Ref Range   Source Wet Prep POC vaginal    WBC, Wet Prep HPF POC few    Bacteria Wet Prep HPF POC Moderate (A) Few   BACTERIA WET PREP MORPHOLOGY POC     Clue Cells Wet Prep HPF POC Moderate (A) None   Clue Cells Wet Prep [place] POC     Yeast Wet Prep HPF POC None None   KOH Wet Prep POC None None   Trichomonas Wet Prep HPF POC Absent Absent     Lab Results  Component Value Date   CREATININE 0.79 10/03/2021   BUN 19 10/03/2021   NA 140 10/03/2021   K 4.0 10/03/2021   CL 104 10/03/2021   CO2 27 10/03/2021    Patient Active Problem List   Diagnosis Date Noted   Dysuria 11/29/2021   Vaginal itching 11/29/2021   Acute cystitis 11/29/2021   Bilateral knee pain 10/10/2021   H/O fracture of rib  02/03/2019   Colon cancer screening 07/26/2017   Vitamin D deficiency 07/26/2017   Elevated glucose 07/21/2017   Contact dermatitis 02/29/2016   Hyperlipidemia 06/02/2015   Routine general medical examination at a health care facility 06/02/2015   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   Osteopenia 07/15/2014   Estrogen deficiency 06/07/2014   Abnormal breast biopsy 06/07/2014   Encounter for Medicare annual wellness exam 05/24/2013   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   Diabetes mellitus screening 02/12/2011   Past Medical History:  Diagnosis Date   Back pain    due to large breasts   Fever blister    GERD (gastroesophageal reflux disease)    No pertinent past medical history    Overweight(278.02)    Past Surgical History:  Procedure Laterality Date   BREAST REDUCTION SURGERY  12/01/13   BUNIONECTOMY  1988   cataract surgery  2010   CESAREAN SECTION     MASS EXCISION  06/08/2011   Procedure: EXCISION MASS;  Surgeon: Cammie Sickle., MD;  Location: [facility];  Service: Orthopedics;  Laterality: Right;  excisional biopsy of exostosis right thumb   REDUCTION MAMMAPLASTY Bilateral 2014   TONSILLECTOMY     Social History   Tobacco Use   Smoking status: Former    Types: Cigarettes    Quit date: 06/07/1975    Years since quitting: 46.5   Smokeless tobacco: Never   Tobacco comments:    quit 1976  Substance Use Topics   Alcohol use: Yes    Alcohol/week: 2.0 - 3.0 standard drinks    Types: 2 - 3 Glasses of wine per week    Comment: occasional wine/alcohol   Drug use: No   Family History  Problem Relation Age of Onset   Asthma Mother    Cancer Father        brain tumor   Heart disease Maternal Grandfather        MI   Breast cancer Sister    Allergies  Allergen Reactions   Penicillins Other (See Comments)    Unsure; was told 40+ years ago: Has patient had a PCN reaction causing immediate rash, facial/tongue/throat swelling,  SOB or lightheadedness with hypotension: Unknown Has patient had a PCN reaction causing severe rash involving mucus membranes or skin necrosis: Unknown Has patient had a PCN reaction that required hospitalization: Unknown Has patient had a PCN reaction occurring within the last 10 years: No If all of the above answers are "NO", then may proceed with Cephalosporin use.    Current Outpatient Medications on File Prior to Visit  Medication Sig Dispense Refill   Calcium Carbonate-Vitamin D (CALCIUM + D PO) Take 1 capsule by mouth 2 (two) times daily.     Cholecalciferol (VITAMIN D) 1000 UNITS capsule Take 2,000 Units by mouth daily.     estradiol (ESTRACE) 0.1 MG/GM vaginal cream Use 1-2 cm in applicator intravaginally twice a week 42.5 g 3   Multiple Vitamin (MULTIVITAMIN) tablet Take 1 tablet by mouth daily.     No current facility-administered medications on file prior to visit.      Review of Systems  Constitutional:  Negative for activity change, appetite change, fatigue, fever and unexpected weight change.  HENT:  Negative for congestion, ear pain, rhinorrhea, sinus pressure and sore throat.   Eyes:  Negative for pain, redness and visual disturbance.  Respiratory:  Negative for cough, shortness of breath and wheezing.   Cardiovascular:  Negative for chest pain and palpitations.  Gastrointestinal:  Negative for abdominal pain, blood in stool, constipation and diarrhea.  Endocrine: Negative for polydipsia and polyuria.  Genitourinary:  Positive for dysuria, urgency and vaginal discharge. Negative for frequency.  Musculoskeletal:  Negative for arthralgias, back pain and myalgias.  Skin:  Negative for pallor and rash.  Allergic/Immunologic: Negative for environmental allergies.  Neurological:  Negative for dizziness, syncope and headaches.  Hematological:  Negative for adenopathy. Does not bruise/bleed easily.  Psychiatric/Behavioral:  Negative for decreased concentration and dysphoric  mood. The patient is not nervous/anxious.       Objective:   Physical Exam Exam conducted with a chaperone present.  Constitutional:      General: She is not in acute distress.    Appearance: Normal appearance. She is well-developed and normal weight.  HENT:     Head: Normocephalic and atraumatic.  Eyes:     Conjunctiva/sclera: Conjunctivae normal.     Pupils: Pupils are equal, round, and reactive to light.  Cardiovascular:     Rate and Rhythm: Normal rate and regular rhythm.  Heart sounds: Normal heart sounds.  Pulmonary:     Effort: Pulmonary effort is normal.     Breath sounds: Normal breath sounds.  Abdominal:     General: Bowel sounds are normal. There is no distension.     Palpations: Abdomen is soft.     Tenderness: There is abdominal tenderness. There is no rebound.     Comments: No cva tenderness  Mild suprapubic tenderness  Genitourinary:    Labia:        Right: No rash or tenderness.        Left: No rash or tenderness.      Urethra: No prolapse.     Vagina: Normal. No vaginal discharge, erythema or tenderness.  Musculoskeletal:     Cervical back: Normal range of motion and neck supple.  Lymphadenopathy:     Cervical: No cervical adenopathy.  Skin:    Findings: No rash.  Neurological:     Mental Status: She is alert.          Assessment & Plan:   Problem List Items Addressed This Visit       Genitourinary   Acute cystitis - Primary    ua pos /voiding symptm Px bactrim  Fluids cx pending  ER prec  Handout given  Disc prevention  Update if not starting to improve in a week or if worsening         Relevant Orders   Urine Culture   Vaginal itching    Few clue cells on wet prep Nl exam  Will wait to tx until uti tx is done if still symptomatic   At risk for yeast upcoming        Relevant Orders   POCT Wet Prep [facility]) (Completed)     Other   Dysuria   Relevant Orders   POCT Urinalysis Dipstick (Automated) (Completed)

## 2021-11-30 LAB — URINE CULTURE
MICRO NUMBER:: 13464763
SPECIMEN QUALITY:: ADEQUATE

## 2021-12-04 ENCOUNTER — Telehealth: Payer: Self-pay

## 2021-12-04 NOTE — Telephone Encounter (Signed)
I have attempted without success to contact this patient by phone to discuss lab results and I left a message on answering machine.

## 2021-12-04 NOTE — Telephone Encounter (Signed)
-----   Message from Abner Greenspan, MD sent at 12/01/2021  7:59 AM EDT ----- Your urine culture is not conclusive  If your urinary symptoms do not improve with the bactrim antibiotic please let us know

## 2021-12-06 ENCOUNTER — Other Ambulatory Visit: Payer: Medicare PPO

## 2021-12-06 ENCOUNTER — Other Ambulatory Visit: Payer: Self-pay

## 2021-12-06 DIAGNOSIS — R300 Dysuria: Secondary | ICD-10-CM

## 2021-12-06 NOTE — Progress Notes (Signed)
r 

## 2021-12-07 ENCOUNTER — Ambulatory Visit: Payer: Medicare PPO | Admitting: Orthopedic Surgery

## 2021-12-07 LAB — URINE CULTURE
MICRO NUMBER:: 13495176
SPECIMEN QUALITY:: ADEQUATE

## 2021-12-08 ENCOUNTER — Encounter: Payer: Self-pay | Admitting: Family Medicine

## 2021-12-08 MED ORDER — METRONIDAZOLE 0.75 % VA GEL: 1.0000 | Freq: Every day | VAGINAL | 0 refills | Status: DC

## 2021-12-27 ENCOUNTER — Encounter: Payer: Self-pay | Admitting: Orthopedic Surgery

## 2021-12-27 ENCOUNTER — Ambulatory Visit: Payer: Medicare PPO | Admitting: Orthopedic Surgery

## 2021-12-27 VITALS — Ht 60.0 in | Wt 130.0 lb

## 2021-12-27 DIAGNOSIS — M170 Bilateral primary osteoarthritis of knee: Secondary | ICD-10-CM | POA: Diagnosis not present

## 2021-12-27 NOTE — Progress Notes (Signed)
Office Visit Note   Patient: Bailey Stephens           Date of Birth: [birth year]           MRN: [id number] Visit Date: 12/27/2021 Requested by: Tower, Wynelle Fanny, MD [place],  [place] [address] PCP: Glori Bickers, Wynelle Fanny, MD  Subjective: Chief Complaint  Patient presents with   Right Knee - Pain   Left Knee - Pain    HPI: Bailey Stephens is a 76 year old active patient with bilateral knee pain right equal to left.  Been going on for 4 to 5 months.  Denies a history of injury.  Does not wake her from sleep at night.  Reports occasional locking and popping but no weakness or giving way.  She has run 5 marathons in her life as well as a lot of 10K's.  Stopped running but currently walks about 3 miles a day.  Enjoys gardening walking and playing bridge.  No prior surgery on either knee.  Takes occasional Aleve.  No medication except for vitamins.  Her husband is having knee replacement in August.              ROS: All systems reviewed are negative as they relate to the chief complaint within the history of present illness.  Patient denies  fevers or chills.   Assessment & Plan: Visit Diagnoses:  1. Primary osteoarthritis of both knees     Plan: Impression is bilateral knee arthritis which is very mild.  No effusion in either knee.  Overall her degenerative changes are fairly age-appropriate.  If she is having a little bit of knee pain and she could consider walking fewer miles a day and instead doing some stationary bike and quad strengthening exercises.  If her symptoms worsen then we would proceed with injections.  Follow-up as needed for injections should her symptoms worsen  Follow-Up Instructions: No follow-ups on file.   Orders:  No orders of the defined types were placed in this encounter.  No orders of the defined types were placed in this encounter.     Procedures: No procedures performed   Clinical Data: No additional findings.  Objective: Vital Signs: Ht 5'  (1.524 m)   Wt 130 lb (59 kg)   BMI 25.39 kg/m   Physical Exam:   Constitutional: Patient appears well-developed HEENT:  Head: Normocephalic Eyes:EOM are normal Neck: Normal range of motion Cardiovascular: Normal rate Pulmonary/chest: Effort normal Neurologic: Patient is alert Skin: Skin is warm Psychiatric: Patient has normal mood and affect   Ortho Exam: Ortho exam demonstrates fit appearing 76 year old female who has a spry gait.  Pedal pulses palpable.  Excellent range of motion of both knees from 0-1 25.  Collateral cruciate ligaments are stable.  Extensor mechanism intact.  No patellofemoral crepitus.  No joint line tenderness.  No groin pain with internal/external Tatian of the leg.  No effusion in either knee.  Specialty Comments:  No specialty comments available.  Imaging: No results found.   PMFS History: Patient Active Problem List   Diagnosis Date Noted   Dysuria 11/29/2021   Vaginal itching 11/29/2021   Acute cystitis 11/29/2021   Bilateral knee pain 10/10/2021   H/O fracture of rib 02/03/2019   Colon cancer screening 07/26/2017   Vitamin D deficiency 07/26/2017   Elevated glucose 07/21/2017   Contact dermatitis 02/29/2016   Hyperlipidemia 06/02/2015   Routine general medical examination at a health care facility 06/02/2015   Atypical lobular hyperplasia  of breast 12/07/2014   Osteopenia 07/15/2014   Estrogen deficiency 06/07/2014   Abnormal breast biopsy 06/07/2014   Encounter for Medicare annual wellness exam 05/24/2013   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   Diabetes mellitus screening 02/12/2011   Past Medical History:  Diagnosis Date   Back pain    due to large breasts   Fever blister    GERD (gastroesophageal reflux disease)    No pertinent past medical history    Overweight(278.02)     Family History  Problem Relation Age of Onset   Asthma Mother    Cancer Father        brain tumor   Heart disease Maternal Grandfather         MI   Breast cancer Sister     Past Surgical History:  Procedure Laterality Date   BREAST REDUCTION SURGERY  12/01/13   BUNIONECTOMY  1988   cataract surgery  2010   CESAREAN SECTION     MASS EXCISION  06/08/2011   Procedure: EXCISION MASS;  Surgeon: Cammie Sickle., MD;  Location: [facility];  Service: Orthopedics;  Laterality: Right;  excisional biopsy of exostosis right thumb   REDUCTION MAMMAPLASTY Bilateral 2014   TONSILLECTOMY     Social History   Occupational History   Not on file  Tobacco Use   Smoking status: Former    Types: Cigarettes    Quit date: 06/07/1975    Years since quitting: 46.5   Smokeless tobacco: Never   Tobacco comments:    quit 1976  Substance and Sexual Activity   Alcohol use: Yes    Alcohol/week: 2.0 - 3.0 standard drinks of alcohol    Types: 2 - 3 Glasses of wine per week    Comment: occasional wine/alcohol   Drug use: No   Sexual activity: Yes    Birth control/protection: None    Comment: Married

## 2022-01-09 DIAGNOSIS — D225 Melanocytic nevi of trunk: Secondary | ICD-10-CM | POA: Diagnosis not present

## 2022-01-09 DIAGNOSIS — D692 Other nonthrombocytopenic purpura: Secondary | ICD-10-CM | POA: Diagnosis not present

## 2022-01-09 DIAGNOSIS — L820 Inflamed seborrheic keratosis: Secondary | ICD-10-CM | POA: Diagnosis not present

## 2022-01-09 DIAGNOSIS — L821 Other seborrheic keratosis: Secondary | ICD-10-CM | POA: Diagnosis not present

## 2022-01-09 DIAGNOSIS — Z85828 Personal history of other malignant neoplasm of skin: Secondary | ICD-10-CM | POA: Diagnosis not present

## 2022-04-13 ENCOUNTER — Ambulatory Visit
Admission: RE | Admit: 2022-04-13 | Discharge: 2022-04-13 | Disposition: A | Discharge status: Home / Self Care | Payer: Medicare PPO | Source: Ambulatory Visit | Attending: Family Medicine | Admitting: Family Medicine

## 2022-04-13 DIAGNOSIS — Z780 Asymptomatic menopausal state: Secondary | ICD-10-CM | POA: Diagnosis not present

## 2022-04-13 DIAGNOSIS — M8589 Other specified disorders of bone density and structure, multiple sites: Secondary | ICD-10-CM | POA: Diagnosis not present

## 2022-04-16 ENCOUNTER — Encounter: Payer: Self-pay | Admitting: *Deleted

## 2022-04-19 ENCOUNTER — Ambulatory Visit: Payer: Medicare PPO | Admitting: Family Medicine

## 2022-04-19 ENCOUNTER — Encounter: Payer: Self-pay | Admitting: Family Medicine

## 2022-04-19 VITALS — BP 126/82 | HR 66 | Temp 97.2°F | Ht 59.75 in | Wt 132.5 lb

## 2022-04-19 DIAGNOSIS — Z1231 Encounter for screening mammogram for malignant neoplasm of breast: Secondary | ICD-10-CM | POA: Diagnosis not present

## 2022-04-19 DIAGNOSIS — M8588 Other specified disorders of bone density and structure, other site: Secondary | ICD-10-CM

## 2022-04-19 DIAGNOSIS — N6099 Unspecified benign mammary dysplasia of unspecified breast: Secondary | ICD-10-CM | POA: Diagnosis not present

## 2022-04-19 NOTE — Assessment & Plan Note (Signed)
Due for mammogram in may -pt forgot Plans to call and schedule Enc self exams Nl exam today   H/o atypical lobular hyperplasia in past and fam h/o breast cancer Will follow closely

## 2022-04-19 NOTE — Progress Notes (Signed)
Subjective:    Patient ID: Bailey Stephens, female    DOB: 01-02-1946, 76 y.o.   MRN: [id number]  HPI Pt presents for 6 month breast exam and f/u of chronic health problems  Wt Readings from Last 3 Encounters:  04/19/22 132 lb 8 oz (60.1 kg)  12/27/21 130 lb (59 kg)  11/29/21 136 lb (61.7 kg)   26.09 kg/m  Doing ok  Husband had a knee replacement - he is doing great   She is going to [place] - soon, excited  Had flu and covid shot    Self breast exam-no lumps or changes  H/o atypical lobular hyperplasia of breast    Osteopenia Dexa 04/2022 - this was down slt still in openia range  Falls: none Fractures:none  Supplements  Exercise : walking   Mammogram 10/2020- [facility]  Unsure if she got one this may  Sister had breast cancer   Patient Active Problem List   Diagnosis Date Noted   Encounter for screening mammogram for breast cancer 04/19/2022   Dysuria 11/29/2021   Vaginal itching 11/29/2021   Bilateral knee pain 10/10/2021   H/O fracture of rib 02/03/2019   Colon cancer screening 07/26/2017   Vitamin D deficiency 07/26/2017   Elevated glucose 07/21/2017   Contact dermatitis 02/29/2016   Hyperlipidemia 06/02/2015   Routine general medical examination at a health care facility 06/02/2015   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   Osteopenia 07/15/2014   Estrogen deficiency 06/07/2014   Abnormal breast biopsy 06/07/2014   Encounter for Medicare annual wellness exam 05/24/2013   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   Diabetes mellitus screening 02/12/2011   Past Medical History:  Diagnosis Date   Back pain    due to large breasts   Fever blister    GERD (gastroesophageal reflux disease)    No pertinent past medical history    Overweight(278.02)    Past Surgical History:  Procedure Laterality Date   BREAST REDUCTION SURGERY  12/01/13   BUNIONECTOMY  1988   cataract surgery  2010   CESAREAN SECTION     MASS EXCISION  06/08/2011    Procedure: EXCISION MASS;  Surgeon: Cammie Sickle., MD;  Location: [facility];  Service: Orthopedics;  Laterality: Right;  excisional biopsy of exostosis right thumb   REDUCTION MAMMAPLASTY Bilateral 2014   TONSILLECTOMY     Social History   Tobacco Use   Smoking status: Former    Types: Cigarettes    Quit date: 06/07/1975    Years since quitting: 46.8   Smokeless tobacco: Never   Tobacco comments:    quit 1976  Substance Use Topics   Alcohol use: Yes    Alcohol/week: 2.0 - 3.0 standard drinks of alcohol    Types: 2 - 3 Glasses of wine per week    Comment: occasional wine/alcohol   Drug use: No   Family History  Problem Relation Age of Onset   Asthma Mother    Cancer Father        brain tumor   Heart disease Maternal Grandfather        MI   Breast cancer Sister    Allergies  Allergen Reactions   Penicillins Other (See Comments)    Unsure; was told 40+ years ago: Has patient had a PCN reaction causing immediate rash, facial/tongue/throat swelling, SOB or lightheadedness with hypotension: Unknown Has patient had a PCN reaction causing severe rash involving mucus membranes or skin necrosis: Unknown Has patient  had a PCN reaction that required hospitalization: Unknown Has patient had a PCN reaction occurring within the last 10 years: No If all of the above answers are "NO", then may proceed with Cephalosporin use.    Current Outpatient Medications on File Prior to Visit  Medication Sig Dispense Refill   Calcium Carbonate-Vitamin D (CALCIUM + D PO) Take 1 capsule by mouth 2 (two) times daily.     Cholecalciferol (VITAMIN D) 1000 UNITS capsule Take 2,000 Units by mouth daily.     estradiol (ESTRACE) 0.1 MG/GM vaginal cream Use 1-2 cm in applicator intravaginally twice a week 42.5 g 3   metroNIDAZOLE (METROGEL VAGINAL) 0.75 % vaginal gel Place 1 Applicatorful vaginally at bedtime. 70 g 0   Multiple Vitamin (MULTIVITAMIN) tablet Take 1 tablet by mouth  daily.     triamcinolone cream (KENALOG) 0.1 % APPLY TOPICALLYTO THE AFFECTED AREAS 2 (TWO) TIMES DAILY AS NEEDED for poison ivy 30 g 3   No current facility-administered medications on file prior to visit.     Review of Systems  Constitutional:  Negative for activity change, appetite change, fatigue, fever and unexpected weight change.  HENT:  Negative for congestion, ear pain, rhinorrhea, sinus pressure and sore throat.   Eyes:  Negative for pain, redness and visual disturbance.  Respiratory:  Negative for cough, shortness of breath and wheezing.   Cardiovascular:  Negative for chest pain and palpitations.  Gastrointestinal:  Negative for abdominal pain, blood in stool, constipation and diarrhea.  Endocrine: Negative for polydipsia and polyuria.  Genitourinary:  Negative for dysuria, frequency and urgency.  Musculoskeletal:  Negative for arthralgias, back pain and myalgias.  Skin:  Negative for pallor and rash.  Allergic/Immunologic: Negative for environmental allergies.  Neurological:  Negative for dizziness, syncope and headaches.  Hematological:  Negative for adenopathy. Does not bruise/bleed easily.  Psychiatric/Behavioral:  Negative for decreased concentration and dysphoric mood. The patient is not nervous/anxious.        Objective:   Physical Exam Constitutional:      General: She is not in acute distress.    Appearance: Normal appearance. She is normal weight. She is not ill-appearing.  HENT:     Head: Normocephalic and atraumatic.  Cardiovascular:     Rate and Rhythm: Normal rate and regular rhythm.  Pulmonary:     Effort: Pulmonary effort is normal. No respiratory distress.  Abdominal:     General: Abdomen is flat.  Genitourinary:    Comments: Breast exam: No mass, nodules, thickening, tenderness, bulging, retraction, inflamation, nipple discharge or skin changes noted.  No axillary or clavicular LA.    Scars from breast reduction noted  No changes   Musculoskeletal:     Comments: No kyphosis  No acute joint changes   Neurological:     Mental Status: She is alert.  Psychiatric:        Mood and Affect: Mood normal.           Assessment & Plan:   Problem List Items Addressed This Visit       Musculoskeletal and Integument   Osteopenia    Reviewed dexa from 04/2022 Osteopenia , slt dec bmd No falls or fx Urged to keep walking and add some strength training  Continue ca and D  Fall prevention discussed  Re check 2 y         Other   Atypical lobular hyperplasia of breast - Primary    No changes on breast exam today  Overdue for yearly  mammogram (was due in may)  She will call and schedule that       Encounter for screening mammogram for breast cancer    Due for mammogram in may -pt forgot Plans to call and schedule Enc self exams Nl exam today   H/o atypical lobular hyperplasia in past and fam h/o breast cancer Will follow closely

## 2022-04-19 NOTE — Assessment & Plan Note (Signed)
Reviewed dexa from 04/2022 Osteopenia , slt dec bmd No falls or fx Urged to keep walking and add some strength training  Continue ca and D  Fall prevention discussed  Re check 2 y

## 2022-04-19 NOTE — Assessment & Plan Note (Signed)
No changes on breast exam today  Overdue for yearly mammogram (was due in may)  She will call and schedule that

## 2022-04-19 NOTE — Patient Instructions (Addendum)
Please schedule your mammogram at the Breast center   Think about adding some strength training - weights, machines, exercise bands, gym, trainer  It makes a big difference   Continue walking  Continue your calcium and vitamin D

## 2022-04-26 ENCOUNTER — Ambulatory Visit: Payer: Medicare PPO | Admitting: Family Medicine

## 2022-04-26 ENCOUNTER — Encounter: Payer: Self-pay | Admitting: Family Medicine

## 2022-04-26 VITALS — BP 100/64 | HR 101 | Temp 98.1°F | Ht 59.75 in | Wt 132.4 lb

## 2022-04-26 DIAGNOSIS — R300 Dysuria: Secondary | ICD-10-CM | POA: Diagnosis not present

## 2022-04-26 DIAGNOSIS — N3001 Acute cystitis with hematuria: Secondary | ICD-10-CM

## 2022-04-26 LAB — POC URINALSYSI DIPSTICK (AUTOMATED)
Bilirubin, UA: NEGATIVE
Blood, UA: POSITIVE
Glucose, UA: NEGATIVE
Ketones, UA: NEGATIVE
Nitrite, UA: NEGATIVE
Protein, UA: NEGATIVE
Spec Grav, UA: <=1.005 — AB (ref 1.010–1.025)
Urobilinogen, UA: 0.2 E.U./dL
pH, UA: 6 (ref 5.0–8.0)

## 2022-04-26 MED ORDER — SULFAMETHOXAZOLE-TRIMETHOPRIM 800-160 MG PO TABS: 1.0000 | ORAL_TABLET | Freq: Two times a day (BID) | ORAL | 0 refills | Status: DC

## 2022-04-26 NOTE — Assessment & Plan Note (Signed)
Acute Urinalysis concerning for possible cystitis.  We will send urine for culture.  She will continue pushing fluid and complete a course of 3 days of Bactrim.  Return and ER precautions provided.

## 2022-04-26 NOTE — Progress Notes (Signed)
Patient ID: Bailey Stephens, female    DOB: April 13, 1946, 76 y.o.   MRN: [id number]  This visit was conducted in person.  BP 100/64   Pulse (!) 101   Temp 98.1 F (36.7 C) (Oral)   Ht 4' 11.75" (1.518 m)   Wt 132 lb 6 oz (60 kg)   SpO2 98%   BMI 26.07 kg/m    CC:  Chief Complaint  Patient presents with   Urinary Urgency    dys   Dysuria    Subjective:   HPI: Bailey Stephens is a 76 y.o. female  pt of Dr. Marliss Coots presenting on 04/26/2022 for Urinary Urgency (dys) and Dysuria  Dysuria  This is a new problem. The current episode started in the past 7 days (2-3). The problem has been gradually improving. The quality of the pain is described as burning. The pain is mild. There has been no fever. There is No history of pyelonephritis. Associated symptoms include frequency, hematuria and urgency. Pertinent negatives include no chills, discharge, flank pain, hesitancy, nausea or vomiting. Associated symptoms comments:  Dyruria, occ incontinence. She has tried increased fluids for the symptoms. The treatment provided mild relief. There is no history of catheterization, kidney stones, recurrent UTIs, a single kidney, urinary stasis or a urological procedure.   Last UTI last year.  No antibiotics in last month.  Reviewed last years urine sample and culture for comparison.     Relevant past medical, surgical, family and social history reviewed and updated as indicated. Interim medical history since our last visit reviewed. Allergies and medications reviewed and updated. Outpatient Medications Prior to Visit  Medication Sig Dispense Refill   Calcium Carbonate-Vitamin D (CALCIUM + D PO) Take 1 capsule by mouth 2 (two) times daily.     Cholecalciferol (VITAMIN D) 1000 UNITS capsule Take 2,000 Units by mouth daily.     estradiol (ESTRACE) 0.1 MG/GM vaginal cream Use 1-2 cm in applicator intravaginally twice a week 42.5 g 3   Multiple Vitamin (MULTIVITAMIN) tablet Take 1 tablet by mouth  daily.     triamcinolone cream (KENALOG) 0.1 % APPLY TOPICALLYTO THE AFFECTED AREAS 2 (TWO) TIMES DAILY AS NEEDED for poison ivy 30 g 3   metroNIDAZOLE (METROGEL VAGINAL) 0.75 % vaginal gel Place 1 Applicatorful vaginally at bedtime. 70 g 0   No facility-administered medications prior to visit.     Per HPI unless specifically indicated in ROS section below Review of Systems  Constitutional:  Negative for chills.  Gastrointestinal:  Negative for nausea and vomiting.  Genitourinary:  Positive for dysuria, frequency, hematuria and urgency. Negative for flank pain and hesitancy.   Objective:  BP 100/64   Pulse (!) 101   Temp 98.1 F (36.7 C) (Oral)   Ht 4' 11.75" (1.518 m)   Wt 132 lb 6 oz (60 kg)   SpO2 98%   BMI 26.07 kg/m   Wt Readings from Last 3 Encounters:  04/26/22 132 lb 6 oz (60 kg)  04/19/22 132 lb 8 oz (60.1 kg)  12/27/21 130 lb (59 kg)      Physical Exam Constitutional:      General: She is not in acute distress.    Appearance: Normal appearance. She is well-developed. She is not ill-appearing or toxic-appearing.  HENT:     Head: Normocephalic.     Right Ear: Hearing, tympanic membrane, ear canal and external ear normal. Tympanic membrane is not erythematous, retracted or bulging.     Left Ear:  Hearing, tympanic membrane, ear canal and external ear normal. Tympanic membrane is not erythematous, retracted or bulging.     Nose: No mucosal edema or rhinorrhea.     Right Sinus: No maxillary sinus tenderness or frontal sinus tenderness.     Left Sinus: No maxillary sinus tenderness or frontal sinus tenderness.     Mouth/Throat:     Pharynx: Uvula midline.  Eyes:     General: Lids are normal. Lids are everted, no foreign bodies appreciated.     Conjunctiva/sclera: Conjunctivae normal.     Pupils: Pupils are equal, round, and reactive to light.  Neck:     Thyroid: No thyroid mass or thyromegaly.     Vascular: No carotid bruit.     Trachea: Trachea normal.   Cardiovascular:     Rate and Rhythm: Normal rate and regular rhythm.     Pulses: Normal pulses.     Heart sounds: Normal heart sounds, S1 normal and S2 normal. No murmur heard.    No friction rub. No gallop.  Pulmonary:     Effort: Pulmonary effort is normal. No tachypnea or respiratory distress.     Breath sounds: Normal breath sounds. No decreased breath sounds, wheezing, rhonchi or rales.  Abdominal:     General: Bowel sounds are normal.     Palpations: Abdomen is soft.     Tenderness: There is abdominal tenderness in the suprapubic area.  Musculoskeletal:     Cervical back: Normal range of motion and neck supple.  Skin:    General: Skin is warm and dry.     Findings: No rash.  Neurological:     Mental Status: She is alert.  Psychiatric:        Mood and Affect: Mood is not anxious or depressed.        Speech: Speech normal.        Behavior: Behavior normal. Behavior is cooperative.        Thought Content: Thought content normal.        Judgment: Judgment normal.       Results for orders placed or performed in visit on 12/06/21  Urine Culture   Specimen: Urine  Result Value Ref Range   MICRO NUMBER: [id number]    SPECIMEN QUALITY: Adequate    Sample Source NOT GIVEN    STATUS: FINAL    ISOLATE 1:      Mixed genital flora isolated. These superficial bacteria are not indicative of a urinary tract infection. No further organism identification is warranted on this specimen. If clinically indicated, recollect clean-catch, mid-stream urine and transfer  immediately to Urine Culture Transport Tube.      COVID 19 screen:  No recent travel or known exposure to COVID19 The patient denies respiratory symptoms of COVID 19 at this time. The importance of social distancing was discussed today.   Assessment and Plan Problem List Items Addressed This Visit     Acute cystitis with hematuria    Acute Urinalysis concerning for possible cystitis.  We will send urine for culture.   She will continue pushing fluid and complete a course of 3 days of Bactrim.  Return and ER precautions provided.      Dysuria - Primary   Relevant Orders   POCT Urinalysis Dipstick (Automated) (Completed)   Urine Culture   Meds ordered this encounter  Medications   sulfamethoxazole-trimethoprim (BACTRIM DS) 800-160 MG tablet    Sig: Take 1 tablet by mouth 2 (two) times daily.    Dispense:  6 tablet    Refill:  0        Eliezer Lofts, MD

## 2022-04-26 NOTE — Patient Instructions (Signed)
Push fluids.  Complete the 3 days of antibiotic.  We will call with culture results.  If have worsening symptoms.. call or call if not improving as expected.  Got ER fever on antibiotics etc.

## 2022-04-29 DIAGNOSIS — N390 Urinary tract infection, site not specified: Secondary | ICD-10-CM | POA: Diagnosis not present

## 2022-04-29 LAB — URINE CULTURE
MICRO NUMBER:: 14105377
SPECIMEN QUALITY:: ADEQUATE

## 2022-05-21 DIAGNOSIS — N390 Urinary tract infection, site not specified: Secondary | ICD-10-CM | POA: Diagnosis not present

## 2022-05-21 DIAGNOSIS — J00 Acute nasopharyngitis [common cold]: Secondary | ICD-10-CM | POA: Diagnosis not present

## 2022-05-31 ENCOUNTER — Encounter: Payer: Self-pay | Admitting: Family Medicine

## 2022-05-31 ENCOUNTER — Ambulatory Visit: Payer: Medicare PPO | Admitting: Family Medicine

## 2022-05-31 VITALS — BP 124/84 | HR 67 | Temp 97.4°F | Ht 59.75 in | Wt 132.8 lb

## 2022-05-31 DIAGNOSIS — R102 Pelvic and perineal pain: Secondary | ICD-10-CM | POA: Diagnosis not present

## 2022-05-31 LAB — POC URINALSYSI DIPSTICK (AUTOMATED)
Bilirubin, UA: NEGATIVE
Blood, UA: NEGATIVE
Glucose, UA: NEGATIVE
Ketones, UA: NEGATIVE
Leukocytes, UA: NEGATIVE
Nitrite, UA: NEGATIVE
Protein, UA: NEGATIVE
Spec Grav, UA: >=1.03 — AB (ref 1.010–1.025)
Urobilinogen, UA: 0.2 E.U./dL
pH, UA: 5.5 (ref 5.0–8.0)

## 2022-05-31 NOTE — Assessment & Plan Note (Signed)
Acute New true urinary infection symptoms at this time.  Symptoms are more vaginal.  Most likely secondary to postmenopausal changes of vaginal region versus yeast infection.  We will send wet prep for evaluation.  She will go ahead and restart her topical estrogen cream to both prevent recurrent UTI and to decrease symptoms of atrophic vaginitis.

## 2022-05-31 NOTE — Progress Notes (Signed)
Patient ID: Bailey Stephens, female    DOB: 1946-06-30, 76 y.o.   MRN: [id number]  This visit was conducted in person.  BP 124/84   Pulse 67   Temp (!) 97.4 F (36.3 C) (Temporal)   Ht 4' 11.75" (1.518 m)   Wt 132 lb 12.8 oz (60.2 kg)   SpO2 97%   BMI 26.15 kg/m    CC:  Chief Complaint  Patient presents with   Vaginal Pain    C/o constant vaginal pain/burning.  Seen previously for similar sxs and treated.  Sxs improved but have returned.     Subjective:   HPI: Bailey Stephens is a 76 y.o. female presenting on 05/31/2022 for Vaginal Pain (C/o constant vaginal pain/burning.  Seen previously for similar sxs and treated.  Sxs improved but have returned. )  Treated for urinary tract infection with Bactrim on April 26, 2022 Urine culture showed pansensitive E. coli.     Symptoms did not resolve.. went to UC... given another antibiotics for UTi.  Still did  not resolve.. went back to urgent care. Given nitrofurantoin x7 days, completed 2 days ago.  When she was on this symptoms improved, now has returned.  She is currently feeling stinging in vaginal area, around the opening to vagina.  No bladder pressure or pain.   Has not taken estrogen vaginal cream... stopped but stopped being sexual activity.  No vaginal  discharge.     Relevant past medical, surgical, family and social history reviewed and updated as indicated. Interim medical history since our last visit reviewed. Allergies and medications reviewed and updated. Outpatient Medications Prior to Visit  Medication Sig Dispense Refill   Calcium Carbonate-Vitamin D (CALCIUM + D PO) Take 1 capsule by mouth 2 (two) times daily.     Cholecalciferol (VITAMIN D) 1000 UNITS capsule Take 2,000 Units by mouth daily.     estradiol (ESTRACE) 0.1 MG/GM vaginal cream Use 1-2 cm in applicator intravaginally twice a week 42.5 g 3   triamcinolone cream (KENALOG) 0.1 % APPLY TOPICALLYTO THE AFFECTED AREAS 2 (TWO) TIMES DAILY AS  NEEDED for poison ivy 30 g 3   Multiple Vitamin (MULTIVITAMIN) tablet Take 1 tablet by mouth daily. (Patient not taking: Reported on 05/31/2022)     sulfamethoxazole-trimethoprim (BACTRIM DS) 800-160 MG tablet Take 1 tablet by mouth 2 (two) times daily. 6 tablet 0   No facility-administered medications prior to visit.     Per HPI unless specifically indicated in ROS section below Review of Systems  Constitutional:  Negative for fatigue and fever.  HENT:  Negative for congestion.   Eyes:  Negative for pain.  Respiratory:  Negative for cough and shortness of breath.   Cardiovascular:  Negative for chest pain, palpitations and leg swelling.  Gastrointestinal:  Negative for abdominal pain.  Genitourinary:  Positive for vaginal pain. Negative for dysuria, vaginal bleeding and vaginal discharge.  Musculoskeletal:  Negative for back pain.  Neurological:  Negative for syncope, light-headedness and headaches.  Psychiatric/Behavioral:  Negative for dysphoric mood.    Objective:  BP 124/84   Pulse 67   Temp (!) 97.4 F (36.3 C) (Temporal)   Ht 4' 11.75" (1.518 m)   Wt 132 lb 12.8 oz (60.2 kg)   SpO2 97%   BMI 26.15 kg/m   Wt Readings from Last 3 Encounters:  05/31/22 132 lb 12.8 oz (60.2 kg)  04/26/22 132 lb 6 oz (60 kg)  04/19/22 132 lb 8 oz (60.1 kg)  Physical Exam Exam conducted with a chaperone present.  Constitutional:      General: She is not in acute distress.    Appearance: Normal appearance. She is well-developed. She is not ill-appearing or toxic-appearing.  HENT:     Head: Normocephalic.     Right Ear: Hearing, tympanic membrane, ear canal and external ear normal. Tympanic membrane is not erythematous, retracted or bulging.     Left Ear: Hearing, tympanic membrane, ear canal and external ear normal. Tympanic membrane is not erythematous, retracted or bulging.     Nose: No mucosal edema or rhinorrhea.     Right Sinus: No maxillary sinus tenderness or frontal sinus  tenderness.     Left Sinus: No maxillary sinus tenderness or frontal sinus tenderness.     Mouth/Throat:     Pharynx: Uvula midline.  Eyes:     General: Lids are normal. Lids are everted, no foreign bodies appreciated.     Conjunctiva/sclera: Conjunctivae normal.     Pupils: Pupils are equal, round, and reactive to light.  Neck:     Thyroid: No thyroid mass or thyromegaly.     Vascular: No carotid bruit.     Trachea: Trachea normal.  Cardiovascular:     Rate and Rhythm: Normal rate and regular rhythm.     Pulses: Normal pulses.     Heart sounds: Normal heart sounds, S1 normal and S2 normal. No murmur heard.    No friction rub. No gallop.  Pulmonary:     Effort: Pulmonary effort is normal. No tachypnea or respiratory distress.     Breath sounds: Normal breath sounds. No decreased breath sounds, wheezing, rhonchi or rales.  Abdominal:     General: Bowel sounds are normal.     Palpations: Abdomen is soft.     Tenderness: There is no abdominal tenderness.  Genitourinary:    Exam position: Supine.     Labia:        Right: No rash, tenderness or lesion.        Left: No rash, tenderness or lesion.      Urethra: No prolapse.     Vagina: Erythema and tenderness present. No vaginal discharge.  Musculoskeletal:     Cervical back: Normal range of motion and neck supple.  Skin:    General: Skin is warm and dry.     Findings: No rash.  Neurological:     Mental Status: She is alert.  Psychiatric:        Mood and Affect: Mood is not anxious or depressed.        Speech: Speech normal.        Behavior: Behavior normal. Behavior is cooperative.        Thought Content: Thought content normal.        Judgment: Judgment normal.       Results for orders placed or performed in visit on 04/26/22  Urine Culture   Specimen: Urine  Result Value Ref Range   MICRO NUMBER: [id number]    SPECIMEN QUALITY: Adequate    Sample Source URINE    STATUS: FINAL    ISOLATE 1: Escherichia coli (A)        Susceptibility   Escherichia coli - URINE CULTURE, REFLEX    AMOX/CLAVULANIC <=2 Sensitive     AMPICILLIN <=2 Sensitive     AMPICILLIN/SULBACTAM <=2 Sensitive     CEFAZOLIN* <=4 Not Reportable      * For infections other than uncomplicated UTI caused by E. coli, K. pneumoniae  or P. mirabilis: Cefazolin is resistant if MIC > or = 8 mcg/mL. (Distinguishing susceptible versus intermediate for isolates with MIC < or = 4 mcg/mL requires additional testing.) For uncomplicated UTI caused by E. coli, K. pneumoniae or P. mirabilis: Cefazolin is susceptible if MIC <32 mcg/mL and predicts susceptible to the oral agents cefaclor, cefdinir, cefpodoxime, cefprozil, cefuroxime, cephalexin and loracarbef.     CEFTAZIDIME <=1 Sensitive     CEFEPIME <=1 Sensitive     CEFTRIAXONE <=1 Sensitive     CIPROFLOXACIN <=0.25 Sensitive     LEVOFLOXACIN <=0.12 Sensitive     GENTAMICIN <=1 Sensitive     IMIPENEM <=0.25 Sensitive     NITROFURANTOIN <=16 Sensitive     PIP/TAZO <=4 Sensitive     TOBRAMYCIN <=1 Sensitive     TRIMETH/SULFA* <=20 Sensitive      * For infections other than uncomplicated UTI caused by E. coli, K. pneumoniae or P. mirabilis: Cefazolin is resistant if MIC > or = 8 mcg/mL. (Distinguishing susceptible versus intermediate for isolates with MIC < or = 4 mcg/mL requires additional testing.) For uncomplicated UTI caused by E. coli, K. pneumoniae or P. mirabilis: Cefazolin is susceptible if MIC <32 mcg/mL and predicts susceptible to the oral agents cefaclor, cefdinir, cefpodoxime, cefprozil, cefuroxime, cephalexin and loracarbef. Legend: S = Susceptible  I = Intermediate R = Resistant  NS = Not susceptible * = Not tested  NR = Not reported **NN = See antimicrobic comments   POCT Urinalysis Dipstick (Automated)  Result Value Ref Range   Color, UA light yellow    Clarity, UA clear    Glucose, UA Negative Negative   Bilirubin, UA negative    Ketones, UA negative    Spec Grav,  UA <=1.005 (A) 1.010 - 1.025   Blood, UA positive    pH, UA 6.0 5.0 - 8.0   Protein, UA Negative Negative   Urobilinogen, UA 0.2 0.2 or 1.0 E.U./dL   Nitrite, UA negative    Leukocytes, UA Small (1+) (A) Negative     COVID 19 screen:  No recent travel or known exposure to COVID19 The patient denies respiratory symptoms of COVID 19 at this time. The importance of social distancing was discussed today.   Assessment and Plan    Problem List Items Addressed This Visit     Vaginal pain - Primary    Acute New true urinary infection symptoms at this time.  Symptoms are more vaginal.  Most likely secondary to postmenopausal changes of vaginal region versus yeast infection.  We will send wet prep for evaluation.  She will go ahead and restart her topical estrogen cream to both prevent recurrent UTI and to decrease symptoms of atrophic vaginitis.      Relevant Orders   POCT Urinalysis Dipstick (Automated) (Completed)   WET PREP BY MOLECULAR PROBE     Eliezer Lofts, MD

## 2022-06-01 LAB — WET PREP BY MOLECULAR PROBE
Candida species: NOT DETECTED
Gardnerella vaginalis: NOT DETECTED
MICRO NUMBER:: 14252319
SPECIMEN QUALITY:: ADEQUATE
Trichomonas vaginosis: NOT DETECTED

## 2022-06-12 ENCOUNTER — Other Ambulatory Visit: Payer: Self-pay | Admitting: Family Medicine

## 2022-06-12 DIAGNOSIS — Z1231 Encounter for screening mammogram for malignant neoplasm of breast: Secondary | ICD-10-CM

## 2022-08-07 ENCOUNTER — Ambulatory Visit: Payer: Medicare PPO

## 2022-08-07 ENCOUNTER — Ambulatory Visit
Admission: RE | Admit: 2022-08-07 | Discharge: 2022-08-07 | Disposition: A | Discharge status: Home / Self Care | Payer: Medicare PPO | Source: Ambulatory Visit | Attending: Family Medicine | Admitting: Family Medicine

## 2022-08-07 DIAGNOSIS — Z1231 Encounter for screening mammogram for malignant neoplasm of breast: Secondary | ICD-10-CM

## 2022-08-29 ENCOUNTER — Ambulatory Visit: Payer: Medicare PPO | Admitting: Family Medicine

## 2022-08-29 ENCOUNTER — Encounter: Payer: Self-pay | Admitting: Family Medicine

## 2022-08-29 VITALS — BP 121/80 | HR 71 | Temp 97.6°F | Ht 59.75 in | Wt 130.5 lb

## 2022-08-29 DIAGNOSIS — R102 Pelvic and perineal pain: Secondary | ICD-10-CM | POA: Diagnosis not present

## 2022-08-29 DIAGNOSIS — R413 Other amnesia: Secondary | ICD-10-CM | POA: Diagnosis not present

## 2022-08-29 NOTE — Assessment & Plan Note (Signed)
Ongoing stinging sensation despite nl wet prep last time  Also non imp with vag est cream Will ref to gyn

## 2022-08-29 NOTE — Patient Instructions (Signed)
Socialize Read  Do math  Play games   Keep a balanced diet  with lots of fruits and veggies   Exercise - keep walking  I want you to add some strength training  Light weights , exercise bands, machines, trainer , chair yoga -all good options   Minimize alcohol   Your memory screen is good today  We will continue to watch   I will add some memory labs to your next panel   I will place a referral to gyn for the vaginal symptoms Please let us know if you don't hear in 1-2 weeks

## 2022-08-29 NOTE — Progress Notes (Signed)
Subjective:    Patient ID: Bailey Stephens, female    DOB: May 13, 1946, 77 y.o.   MRN: [id number]  HPI Pt presents for f/u of chronic health problems   Discomfort in vagina   Worried about memory     Wt Readings from Last 3 Encounters:  08/29/22 130 lb 8 oz (59.2 kg)  05/31/22 132 lb 12.8 oz (60.2 kg)  04/26/22 132 lb 6 oz (60 kg)   25.70 kg/m  Vitals:   08/29/22 1015  BP: (!) 144/78  Pulse: 71  Temp: 97.6 F (36.4 C)  SpO2: 97%      Pt had several visits with Dr Diona Browner for uti/vaginal symptoms    H/o atypical lobular hyperplasia of breast  Mammogram nl 08/2022  Disc menopausal vaginal discomfort at last visit  Had neg wet prep  Inst to re start topical estrogen cream -made it worse instead of better  This did not help Stings  Not itching  Inside and outside  Is sexually active but has not had for 2 weeks / was uncomfortable   No bladder problems    No discharge   Has not seen gyn in the area  [facility] issues for a while 2 months worse   No changes in mood or stress  Makes her worried   Has trouble with words/names  In the moment cannot remember a word and then it comes to her later   Concentration is pretty good- reads a lot  Does not struggle with finances and math  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   No confusion  No hallucinations   No neuro changes   Walks 1-3 mi per day   Plays bridge a lot    Today MMSE scoe is 29/30  Forgot one of the 3 words     Patient Active Problem List   Diagnosis Date Noted   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   Vaginal pain 05/31/2022   Encounter for screening mammogram for breast cancer 04/19/2022   Dysuria 11/29/2021   Vaginal itching 11/29/2021   Bilateral knee pain 10/10/2021   H/O fracture of rib 02/03/2019   Colon cancer screening 07/26/2017   Vitamin D deficiency 07/26/2017   Elevated glucose 07/21/2017   Contact dermatitis 02/29/2016   Hyperlipidemia 06/02/2015    Routine general medical examination at a health care facility 06/02/2015   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   Osteopenia 07/15/2014   Estrogen deficiency 06/07/2014   Abnormal breast biopsy 06/07/2014   Encounter for Medicare annual wellness exam 05/24/2013   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   Diabetes mellitus screening 02/12/2011   Past Medical History:  Diagnosis Date   Back pain    due to large breasts   Fever blister    GERD (gastroesophageal reflux disease)    No pertinent past medical history    Overweight(278.02)    Past Surgical History:  Procedure Laterality Date   BREAST REDUCTION SURGERY  12/01/13   BUNIONECTOMY  1988   cataract surgery  2010   CESAREAN SECTION     MASS EXCISION  06/08/2011   Procedure: EXCISION MASS;  Surgeon: Cammie Sickle., MD;  Location: [place];  Service: Orthopedics;  Laterality: Right;  excisional biopsy of exostosis right thumb   REDUCTION MAMMAPLASTY Bilateral 2014   TONSILLECTOMY     Social History   Tobacco Use   Smoking status: Former    Types: Cigarettes    Quit  date: 06/07/1975    Years since quitting: 47.2   Smokeless tobacco: Never   Tobacco comments:    quit 1976  Substance Use Topics   Alcohol use: Yes    Alcohol/week: 2.0 - 3.0 standard drinks of alcohol    Types: 2 - 3 Glasses of wine per week    Comment: occasional wine/alcohol   Drug use: No   Family History  Problem Relation Age of Onset   Asthma Mother    Cancer Father        brain tumor   Heart disease Maternal Grandfather        MI   Breast cancer Sister    Allergies  Allergen Reactions   Penicillins Other (See Comments)    Unsure; was told 40+ years ago: Has patient had a PCN reaction causing immediate rash, facial/tongue/throat swelling, SOB or lightheadedness with hypotension: Unknown Has patient had a PCN reaction causing severe rash involving mucus membranes or skin necrosis: Unknown Has patient had  a PCN reaction that required hospitalization: Unknown Has patient had a PCN reaction occurring within the last 10 years: No If all of the above answers are "NO", then may proceed with Cephalosporin use.    Current Outpatient Medications on File Prior to Visit  Medication Sig Dispense Refill   Calcium Carbonate-Vitamin D (CALCIUM + D PO) Take 1 capsule by mouth 2 (two) times daily.     Cholecalciferol (VITAMIN D) 1000 UNITS capsule Take 2,000 Units by mouth daily.     estradiol (ESTRACE) 0.1 MG/GM vaginal cream Use 1-2 cm in applicator intravaginally twice a week 42.5 g 3   Multiple Vitamin (MULTIVITAMIN) tablet Take 1 tablet by mouth daily.     triamcinolone cream (KENALOG) 0.1 % APPLY TOPICALLYTO THE AFFECTED AREAS 2 (TWO) TIMES DAILY AS NEEDED for poison ivy 30 g 3   No current facility-administered medications on file prior to visit.     Review of Systems  Constitutional:  Negative for activity change, appetite change, fatigue, fever and unexpected weight change.  HENT:  Negative for congestion, ear pain, rhinorrhea, sinus pressure and sore throat.   Eyes:  Negative for pain, redness and visual disturbance.  Respiratory:  Negative for cough, shortness of breath and wheezing.   Cardiovascular:  Negative for chest pain and palpitations.  Gastrointestinal:  Negative for abdominal pain, blood in stool, constipation and diarrhea.  Endocrine: Negative for polydipsia and polyuria.  Genitourinary:  Positive for vaginal pain. Negative for dysuria, frequency, urgency, vaginal bleeding and vaginal discharge.  Musculoskeletal:  Negative for arthralgias, back pain and myalgias.  Skin:  Negative for pallor and rash.  Allergic/Immunologic: Negative for environmental allergies.  Neurological:  Negative for dizziness, syncope and headaches.  Hematological:  Negative for adenopathy. Does not bruise/bleed easily.  Psychiatric/Behavioral:  Negative for agitation, confusion, decreased concentration,  dysphoric mood and sleep disturbance. The patient is not nervous/anxious.        Objective:   Physical Exam Constitutional:      General: She is not in acute distress.    Appearance: Normal appearance. She is well-developed and normal weight. She is not ill-appearing or diaphoretic.  HENT:     Head: Normocephalic and atraumatic.  Eyes:     General: No scleral icterus.    Conjunctiva/sclera: Conjunctivae normal.     Pupils: Pupils are equal, round, and reactive to light.  Neck:     Thyroid: No thyromegaly.     Vascular: No carotid bruit or JVD.  Cardiovascular:  Rate and Rhythm: Normal rate and regular rhythm.     Heart sounds: Normal heart sounds.     No gallop.  Pulmonary:     Effort: Pulmonary effort is normal. No respiratory distress.     Breath sounds: Normal breath sounds. No wheezing or rales.  Abdominal:     General: There is no abdominal bruit.  Musculoskeletal:     Cervical back: Normal range of motion and neck supple.     Right lower leg: No edema.     Left lower leg: No edema.  Lymphadenopathy:     Cervical: No cervical adenopathy.  Skin:    General: Skin is warm and dry.     Coloration: Skin is not pale.     Findings: No rash.  Neurological:     General: No focal deficit present.     Mental Status: She is alert.     Cranial Nerves: No cranial nerve deficit.     Motor: No weakness.     Coordination: Coordination normal.     Deep Tendon Reflexes: Reflexes are normal and symmetric. Reflexes normal.     Comments: MMSE of 29 out of 30  Mentally sharp Nl clock draw  Psychiatric:        Attention and Perception: Attention normal.        Mood and Affect: Mood normal. Mood is not anxious or depressed.        Speech: Speech normal.        Behavior: Behavior normal.        Thought Content: Thought content is not paranoid.        Cognition and Memory: Cognition and memory normal.           Assessment & Plan:   Problem List Items Addressed This Visit        Other   Short-term memory loss - Primary    Pt is having word recall issues/ they come to her later No issues with concentration and no confusion  No neuro symptoms  She socializes and plays bridge frequently , has good diet and exercise  MMSE today is reassuring with score of 29/30  (only missed one of 3 word recall) and nl clock draw  Disc results  Disc ways to help prevent age rel memory loss Will monitor  Consider neuro ref if worse Will add memory labs to her upcoming PE         Vaginal pain    Ongoing stinging sensation despite nl wet prep last time  Also non imp with vag est cream Will ref to gyn      Relevant Orders   Ambulatory referral to Gynecology

## 2022-08-29 NOTE — Assessment & Plan Note (Signed)
Pt is having word recall issues/ they come to her later No issues with concentration and no confusion  No neuro symptoms  She socializes and plays bridge frequently , has good diet and exercise  MMSE today is reassuring with score of 29/30  (only missed one of 3 word recall) and nl clock draw  Disc results  Disc ways to help prevent age rel memory loss Will monitor  Consider neuro ref if worse Will add memory labs to her upcoming PE

## 2022-09-07 ENCOUNTER — Other Ambulatory Visit (HOSPITAL_COMMUNITY)
Admission: RE | Admit: 2022-09-07 | Discharge: 2022-09-07 | Disposition: A | Discharge status: Home / Self Care | Payer: Medicare PPO | Source: Ambulatory Visit | Attending: Family Medicine | Admitting: Family Medicine

## 2022-09-07 ENCOUNTER — Encounter: Payer: Self-pay | Admitting: Family Medicine

## 2022-09-07 ENCOUNTER — Ambulatory Visit: Payer: Medicare PPO | Admitting: Family Medicine

## 2022-09-07 VITALS — BP 174/102 | HR 59 | Ht 60.0 in | Wt 131.8 lb

## 2022-09-07 DIAGNOSIS — R8279 Other abnormal findings on microbiological examination of urine: Secondary | ICD-10-CM | POA: Diagnosis not present

## 2022-09-07 DIAGNOSIS — R300 Dysuria: Secondary | ICD-10-CM | POA: Diagnosis not present

## 2022-09-07 DIAGNOSIS — R102 Pelvic and perineal pain: Secondary | ICD-10-CM

## 2022-09-07 DIAGNOSIS — N939 Abnormal uterine and vaginal bleeding, unspecified: Secondary | ICD-10-CM

## 2022-09-07 DIAGNOSIS — N3000 Acute cystitis without hematuria: Secondary | ICD-10-CM

## 2022-09-07 LAB — MICROSCOPIC EXAMINATION
Casts: NONE SEEN /lpf
Epithelial Cells (non renal): >10 /hpf — AB (ref 0–10)
WBC, UA: >30 /hpf — AB (ref 0–5)

## 2022-09-07 LAB — URINALYSIS, ROUTINE W REFLEX MICROSCOPIC
Bilirubin, UA: NEGATIVE
Glucose, UA: NEGATIVE
Ketones, UA: NEGATIVE
Nitrite, UA: NEGATIVE
Protein,UA: NEGATIVE
Specific Gravity, UA: 1.016 (ref 1.005–1.030)
Urobilinogen, Ur: 0.2 mg/dL (ref 0.2–1.0)
pH, UA: 6.5 (ref 5.0–7.5)

## 2022-09-07 NOTE — Progress Notes (Signed)
   GYNECOLOGY PROBLEM  VISIT ENCOUNTER NOTE  Subjective:   Bailey Stephens is a 77 y.o. G61P1001 female here for a problem GYN visit.  Current complaints:      Vaginal Pain-- started after going to [place]. She was seen at PCP in Nov and tested for BV, yeast and trich. She was put on estrogen cream which did help that but she feels she might have had a scratch or injury that is presenting with new pain. She is sexually active with her partner of 50 years, they use lubricant and vibrators. She reports no concerns for extra-marital relationships or for STIs. The pain is described as a stinging pain, worsens with urination. Denies any dysuria, polyuria. Denies pelvic pressure, pain, bulging. She was using estradiol cream until about 4 weeks ago  Vaginal bleeding   She reports 1 prior CS, no other surgeries. Has her uterus and ovaries Menopause in 40s.    Denies abnormal vaginal bleeding, discharge, pelvic pain, problems with intercourse or other gynecologic concerns.    Gynecologic History No LMP recorded. Patient is postmenopausal.  Contraception: post menopausal status  Health Maintenance Due  Topic Date Due   Medicare Annual Wellness (AWV)  10/06/2022    The following portions of the patient's history were reviewed and updated as appropriate: allergies, current medications, past family history, past medical history, past social history, past surgical history and problem list.  Review of Systems Pertinent items are noted in HPI.   Objective:  BP (!) 174/102   Pulse (!) 59   Ht 5' (1.524 m)   Wt 131 lb 12.8 oz (59.8 kg)   BMI 25.74 kg/m  Gen: well appearing, NAD HEENT: no scleral icterus CV: RR Lung: Normal WOB Ext: warm well perfused  PELVIC: Normal appearing external genitalia; normal appearing vaginal mucosa and cervix.  There is an area of tenderness just behind the right hymenal ring. Point tenderness area was examined and was mildly erythematous but no open lesion. No  abnormal discharge noted.  No blood in the vault. Normal uterine size, no other palpable masses, no uterine or adnexal tenderness.   Assessment and Plan:   1. Vaginal pain Likely an abrasion?  Postmenopausal vaginal atrophy Discussed use of crisco or coconut for tissue hydration - Urinalysis, Routine w reflex microscopic - Cervicovaginal ancillary only( [place]) - US PELVIS TRANSVAGINAL NON-OB (TV ONLY); Future  2. Vaginal bleeding [place] for bleeding orders to evaluate the endometrium No signs of blood on exam, did not do endometrial sampling today   Please refer to After Visit Summary for other counseling recommendations.   Return in about 4 weeks (around 10/05/2022) for follow up vaginal pain and bleeding.  Future Appointments  Date Time Provider [place]  09/12/2022 10:00 AM [facility] US[facility]  10/15/2022  8:15 AM [facility]  10/22/2022  8:30 AM [place], Wynelle Fanny, MD [facility]     Caren Macadam, MD, MPH, ABFM Attending [place] for [facility]

## 2022-09-10 ENCOUNTER — Telehealth: Payer: Self-pay | Admitting: *Deleted

## 2022-09-10 LAB — CERVICOVAGINAL ANCILLARY ONLY
Bacterial Vaginitis (gardnerella): NEGATIVE
Candida Glabrata: NEGATIVE
Candida Vaginitis: NEGATIVE
Comment: NEGATIVE
Comment: NEGATIVE
Comment: NEGATIVE

## 2022-09-10 NOTE — Telephone Encounter (Addendum)
Called pt per request of Dr. Ernestina Patches. I informed her that her urine test indicates she may have a bladder infection. This needs to be followed up with urine culture. We will need a new specimen to send to the lab. Pt voiced understanding and agreed to lab appt on 3/12 @ 1430. She also stated that she will be out of town on 3/13 and needs to reschedule the ultrasound. I rescheduled to 3/20 @ 1300 and she voiced understanding.

## 2022-09-10 NOTE — Addendum Note (Signed)
Addended by: Langston Reusing on: 09/10/2022 03:08 PM   Modules accepted: Orders

## 2022-09-11 ENCOUNTER — Other Ambulatory Visit: Payer: Medicare PPO

## 2022-09-11 DIAGNOSIS — R8279 Other abnormal findings on microbiological examination of urine: Secondary | ICD-10-CM

## 2022-09-12 ENCOUNTER — Ambulatory Visit: Payer: Medicare PPO

## 2022-09-14 LAB — URINE CULTURE

## 2022-09-18 MED ORDER — SULFAMETHOXAZOLE-TRIMETHOPRIM 800-160 MG PO TABS: 1.0000 | ORAL_TABLET | Freq: Two times a day (BID) | ORAL | 0 refills | Status: DC

## 2022-09-18 NOTE — Addendum Note (Signed)
Addended by: Caryl Bis on: 09/18/2022 08:29 PM   Modules accepted: Orders

## 2022-09-19 ENCOUNTER — Telehealth: Payer: Self-pay | Admitting: Lactation Services

## 2022-09-19 ENCOUNTER — Ambulatory Visit
Admission: RE | Admit: 2022-09-19 | Discharge: 2022-09-19 | Disposition: A | Discharge status: Home / Self Care | Payer: Medicare PPO | Source: Ambulatory Visit | Attending: Family Medicine | Admitting: Family Medicine

## 2022-09-19 DIAGNOSIS — R102 Pelvic and perineal pain: Secondary | ICD-10-CM | POA: Diagnosis not present

## 2022-09-19 DIAGNOSIS — D250 Submucous leiomyoma of uterus: Secondary | ICD-10-CM | POA: Diagnosis not present

## 2022-09-19 DIAGNOSIS — D251 Intramural leiomyoma of uterus: Secondary | ICD-10-CM | POA: Diagnosis not present

## 2022-09-19 NOTE — Telephone Encounter (Signed)
Called patient with results of Urine Culture. She did not answer. LM for her to check her My Chart message and that a prescription has been sent to her Pharmacy. Asked her to call the office with any further questions or concerns.

## 2022-09-21 ENCOUNTER — Telehealth: Payer: Self-pay

## 2022-09-21 NOTE — Telephone Encounter (Signed)
Calling for ultrasound results. Cannot access MyChart

## 2022-09-26 NOTE — Telephone Encounter (Signed)
Returned patients call. She did not answer. LM for her to call the office at her convenience for results she is requesting.

## 2022-09-27 ENCOUNTER — Telehealth: Payer: Self-pay | Admitting: Family Medicine

## 2022-09-27 NOTE — Telephone Encounter (Signed)
Contacted Bailey Stephens to schedule their annual wellness visit. Appointment made for 10/18/2022.  [place] Direct Dial: [phone]

## 2022-10-14 ENCOUNTER — Telehealth: Payer: Self-pay | Admitting: Family Medicine

## 2022-10-14 DIAGNOSIS — E7800 Pure hypercholesterolemia, unspecified: Secondary | ICD-10-CM

## 2022-10-14 DIAGNOSIS — E559 Vitamin D deficiency, unspecified: Secondary | ICD-10-CM

## 2022-10-14 DIAGNOSIS — R7309 Other abnormal glucose: Secondary | ICD-10-CM

## 2022-10-14 DIAGNOSIS — Z131 Encounter for screening for diabetes mellitus: Secondary | ICD-10-CM

## 2022-10-14 NOTE — Telephone Encounter (Signed)
-----   Message from Alvina Chou sent at 10/01/2022  9:59 AM EDT ----- Regarding: Lab orders for Monday, 4.15.24 Patient is scheduled for CPX labs, please order future labs, Thanks , Camelia Eng

## 2022-10-15 ENCOUNTER — Other Ambulatory Visit (INDEPENDENT_AMBULATORY_CARE_PROVIDER_SITE_OTHER): Payer: Medicare PPO

## 2022-10-15 DIAGNOSIS — E559 Vitamin D deficiency, unspecified: Secondary | ICD-10-CM

## 2022-10-15 DIAGNOSIS — Z131 Encounter for screening for diabetes mellitus: Secondary | ICD-10-CM

## 2022-10-15 DIAGNOSIS — E7800 Pure hypercholesterolemia, unspecified: Secondary | ICD-10-CM | POA: Diagnosis not present

## 2022-10-15 DIAGNOSIS — R7309 Other abnormal glucose: Secondary | ICD-10-CM | POA: Diagnosis not present

## 2022-10-15 LAB — COMPREHENSIVE METABOLIC PANEL
ALT: 24 U/L (ref 0–35)
AST: 19 U/L (ref 0–37)
Albumin: 4.3 g/dL (ref 3.5–5.2)
Alkaline Phosphatase: 62 U/L (ref 39–117)
BUN: 12 mg/dL (ref 6–23)
CO2: 27 mEq/L (ref 19–32)
Calcium: 9.3 mg/dL (ref 8.4–10.5)
Chloride: 106 mEq/L (ref 96–112)
Creatinine, Ser: 0.83 mg/dL (ref 0.40–1.20)
GFR: 67.2 mL/min (ref >60.00)
Glucose, Bld: 87 mg/dL (ref 70–99)
Potassium: 4 mEq/L (ref 3.5–5.1)
Sodium: 141 mEq/L (ref 135–145)
Total Bilirubin: 0.6 mg/dL (ref 0.2–1.2)
Total Protein: 6.5 g/dL (ref 6.0–8.3)

## 2022-10-15 LAB — LIPID PANEL
Cholesterol: 182 mg/dL (ref 0–200)
HDL: 77.9 mg/dL (ref >39.00)
LDL Cholesterol: 82 mg/dL (ref 0–99)
NonHDL: 103.87
Total CHOL/HDL Ratio: 2
Triglycerides: 108 mg/dL (ref 0.0–149.0)
VLDL: 21.6 mg/dL (ref 0.0–40.0)

## 2022-10-15 LAB — VITAMIN D 25 HYDROXY (VIT D DEFICIENCY, FRACTURES): VITD: 26.81 ng/mL — ABNORMAL LOW (ref 30.00–100.00)

## 2022-10-15 LAB — TSH: TSH: 2.46 u[IU]/mL (ref 0.35–5.50)

## 2022-10-15 LAB — HEMOGLOBIN A1C: Hgb A1c MFr Bld: 5.7 % (ref 4.6–6.5)

## 2022-10-18 ENCOUNTER — Ambulatory Visit (INDEPENDENT_AMBULATORY_CARE_PROVIDER_SITE_OTHER): Payer: Medicare PPO

## 2022-10-18 VITALS — Ht 60.0 in | Wt 128.0 lb

## 2022-10-18 DIAGNOSIS — Z0000 Encounter for general adult medical examination without abnormal findings: Secondary | ICD-10-CM | POA: Diagnosis not present

## 2022-10-18 DIAGNOSIS — Z780 Asymptomatic menopausal state: Secondary | ICD-10-CM | POA: Diagnosis not present

## 2022-10-18 NOTE — Addendum Note (Signed)
Addended by: Maryan Puls on: 10/18/2022 08:53 AM   Modules accepted: Orders

## 2022-10-18 NOTE — Progress Notes (Signed)
I connected with  Londell Moh on 10/18/22 by a audio enabled telemedicine application and verified that I am speaking with the correct person using two identifiers.  Patient Location: Home  Provider Location: Office/Clinic  I discussed the limitations of evaluation and management by telemedicine. The patient expressed understanding and agreed to proceed.  Subjective:   AAVA DELAND is a 77 y.o. female who presents for Medicare Annual (Subsequent) preventive examination.  Review of Systems      Cardiac Risk Factors include: advanced age (>60men, >72 women)     Objective:    Today's Vitals   10/18/22 0834  Weight: 128 lb (58.1 kg)  Height: 5' (1.524 m)   Body mass index is 25 kg/m.     10/18/2022    8:44 AM 10/05/2021   11:13 AM 08/14/2019   10:30 AM 01/25/2019    9:36 PM 07/23/2017    8:28 AM 05/06/2017    1:00 PM 09/08/2016    2:50 PM  Advanced Directives  Does Patient Have a Medical Advance Directive? Yes Yes Yes No Yes No No  Type of Estate agent of [place];Living will Healthcare Power of [employer] of [place];Living will  Healthcare Power of [place];Living will    Copy of Healthcare Power of Attorney in Chart? No - copy requested Yes - validated most recent copy scanned in chart (See row information) No - copy requested  No - copy requested    Would patient like information on creating a medical advance directive?    No - Patient declined       Current Medications (verified) Outpatient Encounter Medications as of 10/18/2022  Medication Sig   Calcium Carbonate-Vitamin D (CALCIUM + D PO) Take 1 capsule by mouth 2 (two) times daily.   Cholecalciferol (VITAMIN D) 1000 UNITS capsule Take 2,000 Units by mouth daily.   Multiple Vitamin (MULTIVITAMIN) tablet Take 1 tablet by mouth daily.   sulfamethoxazole-trimethoprim (BACTRIM DS) 800-160 MG tablet Take 1 tablet by mouth 2 (two) times daily. (Patient not taking: Reported on  10/18/2022)   triamcinolone cream (KENALOG) 0.1 % APPLY TOPICALLYTO THE AFFECTED AREAS 2 (TWO) TIMES DAILY AS NEEDED for poison ivy (Patient not taking: Reported on 10/18/2022)   No facility-administered encounter medications on file as of 10/18/2022.    Allergies (verified) Penicillins   History: Past Medical History:  Diagnosis Date   Back pain    due to large breasts   Fever blister    GERD (gastroesophageal reflux disease)    No pertinent past medical history    Overweight(278.02)    Past Surgical History:  Procedure Laterality Date   BREAST REDUCTION SURGERY  12/01/13   BUNIONECTOMY  1988   cataract surgery  2010   CESAREAN SECTION     MASS EXCISION  06/08/2011   Procedure: EXCISION MASS;  Surgeon: Wyn Forster., MD;  Location: [facility];  Service: Orthopedics;  Laterality: Right;  excisional biopsy of exostosis right thumb   REDUCTION MAMMAPLASTY Bilateral 2014   TONSILLECTOMY     Family History  Problem Relation Age of Onset   Asthma Mother    Cancer Father        brain tumor   Heart disease Maternal Grandfather        MI   Breast cancer Sister    Social History   Socioeconomic History   Marital status: Married    Spouse name: Not on file   Number of children: Not on  file   Years of education: Not on file   Highest education level: Not on file  Occupational History   Not on file  Tobacco Use   Smoking status: Former    Types: Cigarettes    Quit date: 06/07/1975    Years since quitting: 47.3   Smokeless tobacco: Never   Tobacco comments:    quit 1976  Substance and Sexual Activity   Alcohol use: Yes    Alcohol/week: 2.0 - 3.0 standard drinks of alcohol    Types: 2 - 3 Glasses of wine per week    Comment: occasional wine/alcohol   Drug use: No   Sexual activity: Yes    Birth control/protection: None    Comment: Married  Other Topics Concern   Not on file  Social History Narrative   Not on file   Social Determinants of Health    Financial Resource Strain: Low Risk  (10/18/2022)   Overall Financial Resource Strain (CARDIA)    Difficulty of Paying Living Expenses: Not hard at all  Food Insecurity: No Food Insecurity (10/18/2022)   Hunger Vital Sign    Worried About Running Out of Food in the Last Year: Never true    Ran Out of Food in the Last Year: Never true  Transportation Needs: No Transportation Needs (10/18/2022)   PRAPARE - Administrator, Civil Service (Medical): No    Lack of Transportation (Non-Medical): No  Physical Activity: Insufficiently Active (10/18/2022)   Exercise Vital Sign    Days of Exercise per Week: 5 days    Minutes of Exercise per Session: 20 min  Stress: No Stress Concern Present (10/18/2022)   [employer] of Occupational Health - Occupational Stress Questionnaire    Feeling of Stress : Not at all  Social Connections: Moderately Integrated (10/18/2022)   Social Connection and Isolation Panel [NHANES]    Frequency of Communication with Friends and Family: More than three times a week    Frequency of Social Gatherings with Friends and Family: More than three times a week    Attends Religious Services: Never    Database administrator or Organizations: Yes    Attends Engineer, structural: More than 4 times per year    Marital Status: Married    Tobacco Counseling Counseling given: Not Answered Tobacco comments: quit 1976   Clinical Intake:  Pre-visit preparation completed: Yes  Pain : No/denies pain     Nutritional Risks: None Diabetes: No  How often do you need to have someone help you when you read instructions, pamphlets, or other written materials from your doctor or pharmacy?: 1 - Never  Diabetic? no  Interpreter Needed?: No  Information entered by :: C.Barre Aydelott LPN   Activities of Daily Living    10/18/2022    8:45 AM  In your present state of health, do you have any difficulty performing the following activities:  Hearing? 0  Vision?  0  Difficulty concentrating or making decisions? 1  Comment occasionally forgets  Walking or climbing stairs? 0  Dressing or bathing? 0  Doing errands, shopping? 0  Preparing Food and eating ? N  Using the Toilet? N  In the past six months, have you accidently leaked urine? N  Do you have problems with loss of bowel control? N  Managing your Medications? N  Managing your Finances? N  Housekeeping or managing your Housekeeping? N    Patient Care Team: Tower, Audrie Gallus, MD as PCP - General Dagoberto Ligas, Huntley Dec,  MD as Consulting Physician (Ophthalmology)  Indicate any recent Medical Services you may have received from other than [facility] providers in the past year (date may be approximate).     Assessment:   This is a routine wellness examination for Irving.  Hearing/Vision screen Hearing Screening - Comments:: No aids Vision Screening - Comments:: Glasses for driving - unknown provider  Dietary issues and exercise activities discussed: Current Exercise Habits: Home exercise routine, Type of exercise: walking, Time (Minutes): 20, Frequency (Times/Week): 5, Weekly Exercise (Minutes/Week): 100, Intensity: Mild, Exercise limited by: None identified   Goals Addressed             This Visit's Progress    Patient Stated       Lose 5 pounds and keep walking.       Depression Screen    10/18/2022    8:44 AM 09/07/2022    9:48 AM 08/29/2022   10:33 AM 10/05/2021   11:17 AM 08/29/2020   11:29 AM 08/26/2019    4:19 PM 08/14/2019   10:32 AM  PHQ 2/9 Scores  PHQ - 2 Score 0 0 0 0 0 0 0  PHQ- 9 Score 0 0 0   0 0    Fall Risk    10/18/2022    8:45 AM 08/29/2022   10:33 AM 10/05/2021   11:13 AM 08/29/2020   11:29 AM 08/14/2019   10:30 AM  Fall Risk   Falls in the past year? 0 0 0 0 1  Comment     tripped on steps and fell  Number falls in past yr: 0 0 0  0  Injury with Fall? 0 0 0  1  Comment     broke 2 ribs  Risk for fall due to : No Fall Risks No Fall Risks   History of fall(s)   Follow up Falls prevention discussed Falls evaluation completed Education provided;Falls evaluation completed Falls evaluation completed Falls evaluation completed;Falls prevention discussed    FALL RISK PREVENTION PERTAINING TO THE HOME:  Any stairs in or around the home? Yes  If so, are there any without handrails? No  Home free of loose throw rugs in walkways, pet beds, electrical cords, etc? Yes  Adequate lighting in your home to reduce risk of falls? Yes   ASSISTIVE DEVICES UTILIZED TO PREVENT FALLS:  Life alert? No  Use of a cane, walker or w/c? No  Grab bars in the bathroom? Yes  Shower chair or bench in shower? No  Elevated toilet seat or a handicapped toilet? No    Cognitive Function:    08/14/2019   10:34 AM 07/23/2017    8:28 AM 07/23/2016    1:57 PM  MMSE - Mini Mental State Exam  Orientation to time 5 5 5   Orientation to Place 5 5 5   Registration 3 3 3   Attention/ Calculation 5 0 0  Recall 3 3 3   Language- name 2 objects  0 0  Language- repeat 1 1 1   Language- follow 3 step command  3 3  Language- read & follow direction  0 0  Write a sentence  0 0  Copy design  0 0  Total score  20 20        10/18/2022    8:46 AM 10/05/2021   11:16 AM  6CIT Screen  What Year? 0 points 0 points  What month? 0 points 0 points  What time? 0 points 0 points  Count back from 20 0 points 0  points  Months in reverse 2 points 0 points  Repeat phrase 4 points 0 points  Total Score 6 points 0 points    Immunizations Immunization History  Administered Date(s) Administered   Fluad Quad(high Dose 65+) 02/28/2021   Influenza Split 05/01/2011   Influenza Whole 05/10/2010   Influenza, High Dose Seasonal PF 04/30/2018, 04/05/2022   Influenza,inj,Quad PF,6+ Mos 06/05/2013, 04/23/2014, 06/03/2015, 04/06/2016, 04/18/2017, 02/24/2019   PFIZER(Purple Top)SARS-COV-2 Vaccination 07/23/2019, 08/13/2019   Pfizer Covid Bivalent Pediatric Vaccine(51mos to <51yrs) 04/05/2022   Pneumococcal  Conjugate-13 06/07/2014   Pneumococcal Polysaccharide-23 08/11/2009   Td 08/11/2009   Zoster Recombinat (Shingrix) 02/17/2018, 04/18/2018   Zoster, Live 05/28/2012    TDAP status: Up to date  Flu Vaccine status: Up to date  Pneumococcal vaccine status: Up to date  Covid-19 vaccine status: Information provided on how to obtain vaccines.   Qualifies for Shingles Vaccine? Yes   Zostavax completed Yes   Shingrix Completed?: Yes  Screening Tests Health Maintenance  Topic Date Due   COVID-19 Vaccine (4 - 2023-24 season) 05/31/2022   INFLUENZA VACCINE  01/31/2023   MAMMOGRAM  08/08/2023   Medicare Annual Wellness (AWV)  10/18/2023   Pneumonia Vaccine 33+ Years old  Completed   DEXA SCAN  Completed   Hepatitis C Screening  Completed   Zoster Vaccines- Shingrix  Completed   HPV VACCINES  Aged Out   DTaP/Tdap/Td  Discontinued   COLONOSCOPY (Pts 45-36yrs Insurance coverage will need to be confirmed)  Discontinued   Fecal DNA (Cologuard)  Discontinued    Health Maintenance  Health Maintenance Due  Topic Date Due   COVID-19 Vaccine (4 - 2023-24 season) 05/31/2022    Colorectal cancer screening: No longer required.   Mammogram status: Completed 08/07/22. Repeat every year  Bone Density status: Completed 04/13/22. Results reflect: Bone density results: OSTEOPENIA. Repeat every 2 years.  Lung Cancer Screening: (Low Dose CT Chest recommended if Age 54-80 years, 30 pack-year currently smoking OR have quit w/in 15years.) does not qualify.   Lung Cancer Screening Referral: no  Additional Screening:  Hepatitis C Screening: does qualify; Completed 07/23/16  Vision Screening: Recommended annual ophthalmology exams for early detection of glaucoma and other disorders of the eye. Is the patient up to date with their annual eye exam?  Yes  Who is the provider or what is the name of the office in which the patient attends annual eye exams? Unknown Provider If pt is not established  with a provider, would they like to be referred to a provider to establish care? No .   Dental Screening: Recommended annual dental exams for proper oral hygiene  Community Resource Referral / Chronic Care Management: CRR required this visit?  No   CCM required this visit?  No      Plan:     I have personally reviewed and noted the following in the patient's chart:   Medical and social history Use of alcohol, tobacco or illicit drugs  Current medications and supplements including opioid prescriptions. Patient is not currently taking opioid prescriptions. Functional ability and status Nutritional status Physical activity Advanced directives List of other physicians Hospitalizations, surgeries, and ER visits in previous 12 months Vitals Screenings to include cognitive, depression, and falls Referrals and appointments  In addition, I have reviewed and discussed with patient certain preventive protocols, quality metrics, and best practice recommendations. A written personalized care plan for preventive services as well as general preventive health recommendations were provided to patient.     Broderic Bara  Linus Orn, LPN   [id number]   Nurse Notes: none

## 2022-10-18 NOTE — Patient Instructions (Addendum)
Bailey Stephens , Thank you for taking time to come for your Medicare Wellness Visit. I appreciate your ongoing commitment to your health goals. Please review the following plan we discussed and let me know if I can assist you in the future.   These are the goals we discussed:  Goals      Patient Stated     08/14/2019, I will maintain and continue medications as prescribed.      Patient Stated     Lose 5 pounds and keep walking.     Weight (lb) < 131 lb (59.4 kg)     Target weight is 130 lbs. Starting 07/23/17, I will make healthier choices for breakfast.      Weight (lb) < 200 lb (90.7 kg)     Like to loose 5 pounds        This is a list of the screening recommended for you and due dates:  Health Maintenance  Topic Date Due   COVID-19 Vaccine (4 - 2023-24 season) 05/31/2022   Flu Shot  01/31/2023   Mammogram  08/08/2023   Medicare Annual Wellness Visit  10/18/2023   Pneumonia Vaccine  Completed   DEXA scan (bone density measurement)  Completed   Hepatitis C Screening: USPSTF Recommendation to screen - Ages 54-79 yo.  Completed   Zoster (Shingles) Vaccine  Completed   HPV Vaccine  Aged Out   DTaP/Tdap/Td vaccine  Discontinued   Colon Cancer Screening  Discontinued   Cologuard (Stool DNA test)  Discontinued    Advanced directives: Please bring a copy of your health care power of attorney and living will to the office to be added to your chart at your convenience.   Conditions/risks identified: none  Next appointment: Follow up in one year for your annual wellness visit 10/22/23 @ 8:15 telephone   Preventive Care 65 Years and Older, Female Preventive care refers to lifestyle choices and visits with your health care provider that can promote health and wellness. What does preventive care include? A yearly physical exam. This is also called an annual well check. Dental exams once or twice a year. Routine eye exams. Ask your health care provider how often you should have your  eyes checked. Personal lifestyle choices, including: Daily care of your teeth and gums. Regular physical activity. Eating a healthy diet. Avoiding tobacco and drug use. Limiting alcohol use. Practicing safe sex. Taking low-dose aspirin every day. Taking vitamin and mineral supplements as recommended by your health care provider. What happens during an annual well check? The services and screenings done by your health care provider during your annual well check will depend on your age, overall health, lifestyle risk factors, and family history of disease. Counseling  Your health care provider may ask you questions about your: Alcohol use. Tobacco use. Drug use. Emotional well-being. Home and relationship well-being. Sexual activity. Eating habits. History of falls. Memory and ability to understand (cognition). Work and work Astronomer. Reproductive health. Screening  You may have the following tests or measurements: Height, weight, and BMI. Blood pressure. Lipid and cholesterol levels. These may be checked every 5 years, or more frequently if you are over 54 years old. Skin check. Lung cancer screening. You may have this screening every year starting at age 57 if you have a 30-pack-year history of smoking and currently smoke or have quit within the past 15 years. Fecal occult blood test (FOBT) of the stool. You may have this test every year starting at age 69. Flexible  sigmoidoscopy or colonoscopy. You may have a sigmoidoscopy every 5 years or a colonoscopy every 10 years starting at age 25. Hepatitis C blood test. Hepatitis B blood test. Sexually transmitted disease (STD) testing. Diabetes screening. This is done by checking your blood sugar (glucose) after you have not eaten for a while (fasting). You may have this done every 1-3 years. Bone density scan. This is done to screen for osteoporosis. You may have this done starting at age 64. Mammogram. This may be done every 1-2  years. Talk to your health care provider about how often you should have regular mammograms. Talk with your health care provider about your test results, treatment options, and if necessary, the need for more tests. Vaccines  Your health care provider may recommend certain vaccines, such as: Influenza vaccine. This is recommended every year. Tetanus, diphtheria, and acellular pertussis (Tdap, Td) vaccine. You may need a Td booster every 10 years. Zoster vaccine. You may need this after age 47. Pneumococcal 13-valent conjugate (PCV13) vaccine. One dose is recommended after age 86. Pneumococcal polysaccharide (PPSV23) vaccine. One dose is recommended after age 50. Talk to your health care provider about which screenings and vaccines you need and how often you need them. This information is not intended to replace advice given to you by your health care provider. Make sure you discuss any questions you have with your health care provider. Document Released: 07/15/2015 Document Revised: 03/07/2016 Document Reviewed: 04/19/2015 Elsevier Interactive Patient Education  2017 [place] Prevention in the Home Falls can cause injuries. They can happen to people of all ages. There are many things you can do to make your home safe and to help prevent falls. What can I do on the outside of my home? Regularly fix the edges of walkways and driveways and fix any cracks. Remove anything that might make you trip as you walk through a door, such as a raised step or threshold. Trim any bushes or trees on the path to your home. Use bright outdoor lighting. Clear any walking paths of anything that might make someone trip, such as rocks or tools. Regularly check to see if handrails are loose or broken. Make sure that both sides of any steps have handrails. Any raised decks and porches should have guardrails on the edges. Have any leaves, snow, or ice cleared regularly. Use sand or salt on walking paths  during winter. Clean up any spills in your garage right away. This includes oil or grease spills. What can I do in the bathroom? Use night lights. Install grab bars by the toilet and in the tub and shower. Do not use towel bars as grab bars. Use non-skid mats or decals in the tub or shower. If you need to sit down in the shower, use a plastic, non-slip stool. Keep the floor dry. Clean up any water that spills on the floor as soon as it happens. Remove soap buildup in the tub or shower regularly. Attach bath mats securely with double-sided non-slip rug tape. Do not have throw rugs and other things on the floor that can make you trip. What can I do in the bedroom? Use night lights. Make sure that you have a light by your bed that is easy to reach. Do not use any sheets or blankets that are too big for your bed. They should not hang down onto the floor. Have a firm chair that has side arms. You can use this for support while you get dressed.  Do not have throw rugs and other things on the floor that can make you trip. What can I do in the kitchen? Clean up any spills right away. Avoid walking on wet floors. Keep items that you use a lot in easy-to-reach places. If you need to reach something above you, use a strong step stool that has a grab bar. Keep electrical cords out of the way. Do not use floor polish or wax that makes floors slippery. If you must use wax, use non-skid floor wax. Do not have throw rugs and other things on the floor that can make you trip. What can I do with my stairs? Do not leave any items on the stairs. Make sure that there are handrails on both sides of the stairs and use them. Fix handrails that are broken or loose. Make sure that handrails are as long as the stairways. Check any carpeting to make sure that it is firmly attached to the stairs. Fix any carpet that is loose or worn. Avoid having throw rugs at the top or bottom of the stairs. If you do have throw  rugs, attach them to the floor with carpet tape. Make sure that you have a light switch at the top of the stairs and the bottom of the stairs. If you do not have them, ask someone to add them for you. What else can I do to help prevent falls? Wear shoes that: Do not have high heels. Have rubber bottoms. Are comfortable and fit you well. Are closed at the toe. Do not wear sandals. If you use a stepladder: Make sure that it is fully opened. Do not climb a closed stepladder. Make sure that both sides of the stepladder are locked into place. Ask someone to hold it for you, if possible. Clearly mark and make sure that you can see: Any grab bars or handrails. First and last steps. Where the edge of each step is. Use tools that help you move around (mobility aids) if they are needed. These include: Canes. Walkers. Scooters. Crutches. Turn on the lights when you go into a dark area. Replace any light bulbs as soon as they burn out. Set up your furniture so you have a clear path. Avoid moving your furniture around. If any of your floors are uneven, fix them. If there are any pets around you, be aware of where they are. Review your medicines with your doctor. Some medicines can make you feel dizzy. This can increase your chance of falling. Ask your doctor what other things that you can do to help prevent falls. This information is not intended to replace advice given to you by your health care provider. Make sure you discuss any questions you have with your health care provider. Document Released: 04/14/2009 Document Revised: 11/24/2015 Document Reviewed: 07/23/2014 Elsevier Interactive Patient Education  2017 [employer].

## 2022-10-22 ENCOUNTER — Ambulatory Visit (INDEPENDENT_AMBULATORY_CARE_PROVIDER_SITE_OTHER): Payer: Medicare PPO | Admitting: Family Medicine

## 2022-10-22 ENCOUNTER — Encounter: Payer: Self-pay | Admitting: Family Medicine

## 2022-10-22 VITALS — BP 136/86 | HR 69 | Temp 97.9°F | Ht 59.75 in | Wt 131.4 lb

## 2022-10-22 DIAGNOSIS — N6099 Unspecified benign mammary dysplasia of unspecified breast: Secondary | ICD-10-CM

## 2022-10-22 DIAGNOSIS — Z1211 Encounter for screening for malignant neoplasm of colon: Secondary | ICD-10-CM

## 2022-10-22 DIAGNOSIS — M8588 Other specified disorders of bone density and structure, other site: Secondary | ICD-10-CM

## 2022-10-22 DIAGNOSIS — Z0000 Encounter for general adult medical examination without abnormal findings: Secondary | ICD-10-CM | POA: Diagnosis not present

## 2022-10-22 DIAGNOSIS — E559 Vitamin D deficiency, unspecified: Secondary | ICD-10-CM | POA: Diagnosis not present

## 2022-10-22 DIAGNOSIS — R7309 Other abnormal glucose: Secondary | ICD-10-CM | POA: Diagnosis not present

## 2022-10-22 DIAGNOSIS — Z131 Encounter for screening for diabetes mellitus: Secondary | ICD-10-CM

## 2022-10-22 DIAGNOSIS — E7800 Pure hypercholesterolemia, unspecified: Secondary | ICD-10-CM | POA: Diagnosis not present

## 2022-10-22 NOTE — Assessment & Plan Note (Signed)
Lab Results  Component Value Date   HGBA1C 5.7 10/15/2022   disc imp of low glycemic diet and wt loss to prevent DM2  

## 2022-10-22 NOTE — Assessment & Plan Note (Signed)
Lab Results  Component Value Date   HGBA1C 5.7 10/15/2022   disc imp of low glycemic diet and wt loss to prevent DM2

## 2022-10-22 NOTE — Patient Instructions (Addendum)
Keep walking  Add some strength training to your routine, this is important for bone and brain health and can reduce your risk of falls and help your body use insulin properly and regulate weight  Light weights, exercise bands , and internet videos are a good way to start  Yoga (chair or regular), machines , floor exercises or a gym with machines are also good options    Continue your calcium plus D Increase your additional vitamin D3 to 4000 iu daily    Try to get most of your carbohydrates from produce (with the exception of white potatoes)  Eat less bread/pasta/rice/snack foods/cereals/sweets and other items from the middle of the grocery store (processed carbs)  Keep socializing

## 2022-10-22 NOTE — Progress Notes (Signed)
Subjective:    Patient ID: Bailey Stephens, female    DOB: 01/31/1946, 77 y.o.   MRN: [id number]  HPI Here for health maintenance exam and to review chronic medical problems    Wt Readings from Last 3 Encounters:  10/22/22 131 lb 6 oz (59.6 kg)  10/18/22 128 lb (58.1 kg)  09/07/22 131 lb 12.8 oz (59.8 kg)   25.87 kg/m  Vitals:   10/22/22 0826  BP: 136/86  Pulse: 69  Temp: 97.9 F (36.6 C)  SpO2: 96%   Stress the past week  Husband had h/o prostate cancer/ psa went up/ but then re test was ok  Worried a lot  Feels better now   Feeling ok  Discussed memory issues at last visit- and her MMSE was ok  Writes things down more   Socializes/ plays bridge and goes out with friends    Immunization History  Administered Date(s) Administered   Fluad Quad(high Dose 65+) 02/28/2021   Influenza Split 05/01/2011   Influenza Whole 05/10/2010   Influenza, High Dose Seasonal PF 04/30/2018, 04/05/2022   Influenza,inj,Quad PF,6+ Mos 06/05/2013, 04/23/2014, 06/03/2015, 04/06/2016, 04/18/2017, 02/24/2019   PFIZER(Purple Top)SARS-COV-2 Vaccination 07/23/2019, 08/13/2019   Pfizer Covid Bivalent Pediatric Vaccine(77mos to <62yrs) 04/05/2022   Pneumococcal Conjugate-13 06/07/2014   Pneumococcal Polysaccharide-23 08/11/2009   Td 08/11/2009   Zoster Recombinat (Shingrix) 02/17/2018, 04/18/2018   Zoster, Live 05/28/2012   There are no preventive care reminders to display for this patient.  Mammogram 08/2022 Self breast exam: nothing new  Has breast exam Q 6 mo due to abn path on breast red tissue (lobular hyperplasia)  Saw gyn for some vaginal c/o Had pelvic US Fibroid uterus  Nl endom stripe and nl ovaries    Colonoscopy 07/2007  Cologuard 08/2020 negative   Dexa 04/2022  osteopenia Falls: none Fractures:none  Supplements ca and D Extra D  Last vitamin D Lab Results  Component Value Date   VD25OH 26.81 (L) 10/15/2022    Exercise : (was off track for 2 wk) Back to waking  1-3 miles per day  Working in yard at least 1 h per day    Mood    10/18/2022    8:44 AM 09/07/2022    9:48 AM 08/29/2022   10:33 AM 10/05/2021   11:17 AM 08/29/2020   11:29 AM  Depression screen PHQ 2/9  Decreased Interest 0 0 0 0 0  Down, Depressed, Hopeless 0 0 0 0 0  PHQ - 2 Score 0 0 0 0 0  Altered sleeping 0 0 0    Tired, decreased energy 0 0 0    Change in appetite 0 0 0    Feeling bad or failure about yourself  0 0 0    Trouble concentrating 0 0 0    Moving slowly or fidgety/restless 0 0 0    Suicidal thoughts 0 0 0    PHQ-9 Score 0 0 0    Difficult doing work/chores Not difficult at all  Not difficult at all       Derm care : goes once per year   DM screening  Lab Results  Component Value Date   HGBA1C 5.7 10/15/2022  Was 5.8   Does not pay much attn to sugar in her diet  Eating more fruit instead of sweets   Hyperlipidemia Lab Results  Component Value Date   CHOL 182 10/15/2022   CHOL 176 10/03/2021   CHOL 194 08/22/2020   Lab Results  Component  Value Date   HDL 77.90 10/15/2022   HDL 68.30 10/03/2021   HDL 75.40 08/22/2020   Lab Results  Component Value Date   LDLCALC 82 10/15/2022   LDLCALC 87 10/03/2021   LDLCALC 92 08/22/2020   Lab Results  Component Value Date   TRIG 108.0 10/15/2022   TRIG 107.0 10/03/2021   TRIG 135.0 08/22/2020   Lab Results  Component Value Date   CHOLHDL 2 10/15/2022   CHOLHDL 3 10/03/2021   CHOLHDL 3 08/22/2020   No results found for: "LDLDIRECT"  Last metabolic panel Lab Results  Component Value Date   GLUCOSE 87 10/15/2022   NA 141 10/15/2022   K 4.0 10/15/2022   CL 106 10/15/2022   CO2 27 10/15/2022   BUN 12 10/15/2022   CREATININE 0.83 10/15/2022   GFRNONAA >60 05/06/2017   CALCIUM 9.3 10/15/2022   PROT 6.5 10/15/2022   ALBUMIN 4.3 10/15/2022   BILITOT 0.6 10/15/2022   ALKPHOS 62 10/15/2022   AST 19 10/15/2022   ALT 24 10/15/2022   ANIONGAP 8 05/06/2017   [id number]  Patient Active Problem List    Diagnosis Date Noted   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   Vaginal pain 05/31/2022   Encounter for screening mammogram for breast cancer 04/19/2022   Bilateral knee pain 10/10/2021   H/O fracture of rib 02/03/2019   Colon cancer screening 07/26/2017   Vitamin D deficiency 07/26/2017   Elevated glucose 07/21/2017   Contact dermatitis 02/29/2016   Hyperlipidemia 06/02/2015   Routine general medical examination at a health care facility 06/02/2015   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   Osteopenia 07/15/2014   Estrogen deficiency 06/07/2014   Abnormal breast biopsy 06/07/2014   Encounter for Medicare annual wellness exam 05/24/2013   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   Past Medical History:  Diagnosis Date   Back pain    due to large breasts   Fever blister    GERD (gastroesophageal reflux disease)    No pertinent past medical history    Overweight(278.02)    Past Surgical History:  Procedure Laterality Date   BREAST REDUCTION SURGERY  12/01/13   BUNIONECTOMY  1988   cataract surgery  2010   CESAREAN SECTION     MASS EXCISION  06/08/2011   Procedure: EXCISION MASS;  Surgeon: Wyn Forster., MD;  Location: [facility];  Service: Orthopedics;  Laterality: Right;  excisional biopsy of exostosis right thumb   REDUCTION MAMMAPLASTY Bilateral 2014   TONSILLECTOMY     Social History   Tobacco Use   Smoking status: Former    Types: Cigarettes    Quit date: 06/07/1975    Years since quitting: 47.4   Smokeless tobacco: Never   Tobacco comments:    quit 1976  Substance Use Topics   Alcohol use: Yes    Alcohol/week: 2.0 - 3.0 standard drinks of alcohol    Types: 2 - 3 Glasses of wine per week    Comment: occasional wine/alcohol   Drug use: No   Family History  Problem Relation Age of Onset   Asthma Mother    Cancer Father        brain tumor   Heart disease Maternal Grandfather        MI   Breast cancer Sister    Allergies   Allergen Reactions   Penicillins Other (See Comments)    Unsure; was told 40+ years ago: Has patient had a PCN reaction causing immediate rash, facial/tongue/throat swelling,  SOB or lightheadedness with hypotension: Unknown Has patient had a PCN reaction causing severe rash involving mucus membranes or skin necrosis: Unknown Has patient had a PCN reaction that required hospitalization: Unknown Has patient had a PCN reaction occurring within the last 10 years: No If all of the above answers are "NO", then may proceed with Cephalosporin use.    Current Outpatient Medications on File Prior to Visit  Medication Sig Dispense Refill   Calcium Carbonate-Vitamin D (CALCIUM + D PO) Take 1 capsule by mouth 2 (two) times daily.     Cholecalciferol (VITAMIN D) 1000 UNITS capsule Take 4,000 Units by mouth daily.     Multiple Vitamin (MULTIVITAMIN) tablet Take 1 tablet by mouth daily.     triamcinolone cream (KENALOG) 0.1 % APPLY TOPICALLYTO THE AFFECTED AREAS 2 (TWO) TIMES DAILY AS NEEDED for poison ivy 30 g 3   No current facility-administered medications on file prior to visit.    Review of Systems  Constitutional:  Negative for activity change, appetite change, fatigue, fever and unexpected weight change.  HENT:  Negative for congestion, ear pain, rhinorrhea, sinus pressure and sore throat.   Eyes:  Negative for pain, redness and visual disturbance.  Respiratory:  Negative for cough, shortness of breath and wheezing.   Cardiovascular:  Negative for chest pain and palpitations.  Gastrointestinal:  Negative for abdominal pain, blood in stool, constipation and diarrhea.  Endocrine: Negative for polydipsia and polyuria.  Genitourinary:  Negative for dysuria, frequency and urgency.  Musculoskeletal:  Negative for arthralgias, back pain and myalgias.  Skin:  Negative for pallor and rash.  Allergic/Immunologic: Negative for environmental allergies.  Neurological:  Negative for dizziness, syncope and  headaches.  Hematological:  Negative for adenopathy. Does not bruise/bleed easily.  Psychiatric/Behavioral:  Negative for decreased concentration and dysphoric mood. The patient is not nervous/anxious.        Objective:   Physical Exam Constitutional:      General: She is not in acute distress.    Appearance: Normal appearance. She is well-developed and normal weight. She is not ill-appearing or diaphoretic.  HENT:     Head: Normocephalic and atraumatic.     Right Ear: Tympanic membrane, ear canal and external ear normal.     Left Ear: Tympanic membrane, ear canal and external ear normal.     Nose: Nose normal. No congestion.     Mouth/Throat:     Mouth: Mucous membranes are moist.     Pharynx: Oropharynx is clear. No posterior oropharyngeal erythema.  Eyes:     General: No scleral icterus.    Extraocular Movements: Extraocular movements intact.     Conjunctiva/sclera: Conjunctivae normal.     Pupils: Pupils are equal, round, and reactive to light.  Neck:     Thyroid: No thyromegaly.     Vascular: No carotid bruit or JVD.  Cardiovascular:     Rate and Rhythm: Normal rate and regular rhythm.     Pulses: Normal pulses.     Heart sounds: Normal heart sounds.     No gallop.  Pulmonary:     Effort: Pulmonary effort is normal. No respiratory distress.     Breath sounds: Normal breath sounds. No wheezing.     Comments: Good air exch Chest:     Chest wall: No tenderness.  Abdominal:     General: Bowel sounds are normal. There is no distension or abdominal bruit.     Palpations: Abdomen is soft. There is no mass.  Tenderness: There is no abdominal tenderness.     Hernia: No hernia is present.  Genitourinary:    Comments: Breast exam: No mass, nodules, thickening, tenderness, bulging, retraction, inflamation, nipple discharge or skin changes noted.  No axillary or clavicular LA.     Musculoskeletal:        General: No tenderness. Normal range of motion.     Cervical back:  Normal range of motion and neck supple. No rigidity. No muscular tenderness.     Right lower leg: No edema.     Left lower leg: No edema.     Comments: No kyphosis   Lymphadenopathy:     Cervical: No cervical adenopathy.  Skin:    General: Skin is warm and dry.     Coloration: Skin is not pale.     Findings: No erythema or rash.     Comments: Solar lentigines diffusely   Neurological:     Mental Status: She is alert. Mental status is at baseline.     Cranial Nerves: No cranial nerve deficit.     Motor: No abnormal muscle tone.     Coordination: Coordination normal.     Gait: Gait normal.     Deep Tendon Reflexes: Reflexes are normal and symmetric. Reflexes normal.  Psychiatric:        Mood and Affect: Mood normal.        Cognition and Memory: Cognition and memory normal.           Assessment & Plan:   Problem List Items Addressed This Visit       Musculoskeletal and Integument   Osteopenia    Dexa 04/2022 No falls or fx  D level is low - disc imp of extra D Getting back on track with exercise/enc strength training   Re check dexa at 2 y        Other   Atypical lobular hyperplasia of breast    Nl exam today  Mammogram utd 08/2022       Colon cancer screening    Cologuard neg 08/2020  This is good for 3 y       RESOLVED: Diabetes mellitus screening    Lab Results  Component Value Date   HGBA1C 5.7 10/15/2022   disc imp of low glycemic diet and wt loss to prevent DM2       Elevated glucose    Lab Results  Component Value Date   HGBA1C 5.7 10/15/2022  disc imp of low glycemic diet and wt loss to prevent DM2       Hyperlipidemia    Disc goals for lipids and reasons to control them Rev last labs with pt Rev low sat fat diet in detail  Controlled with diet  LDL of 82      Routine general medical examination at a health care facility - Primary    Reviewed health habits including diet and exercise and skin cancer prevention Reviewed appropriate  screening tests for age  Also reviewed health mt list, fam hx and immunization status , as well as social and family history   See HPI Labs reviewed and ordered  Mammogram utd 08/2022 and nl exam  Had gyn visit /rev reassuring pelvic US  Cologuard neg 08/2020 Dexa utd 04/2022  no falls or fx/ disc vit D intake and exercise  PHQ of 0        Vitamin D deficiency    Last vitamin D Lab Results  Component Value Date   VD25OH  26.81 (L) 10/15/2022  Continues ca plus D Will inc her extra D3 to 4000 iu daily  Disc imp to bone and overall health

## 2022-10-22 NOTE — Assessment & Plan Note (Addendum)
Last vitamin D Lab Results  Component Value Date   VD25OH 26.81 (L) 10/15/2022   Continues ca plus D Will inc her extra D3 to 4000 iu daily  Disc imp to bone and overall health

## 2022-10-22 NOTE — Assessment & Plan Note (Signed)
Reviewed health habits including diet and exercise and skin cancer prevention Reviewed appropriate screening tests for age  Also reviewed health mt list, fam hx and immunization status , as well as social and family history   See HPI Labs reviewed and ordered  Mammogram utd 08/2022 and nl exam  Had gyn visit /rev reassuring pelvic US  Cologuard neg 08/2020 Dexa utd 04/2022  no falls or fx/ disc vit D intake and exercise  PHQ of 0

## 2022-10-22 NOTE — Assessment & Plan Note (Signed)
Cologuard neg 08/2020  This is good for 3 y

## 2022-10-22 NOTE — Assessment & Plan Note (Signed)
Disc goals for lipids and reasons to control them Rev last labs with pt Rev low sat fat diet in detail  Controlled with diet  LDL of 82

## 2022-10-22 NOTE — Assessment & Plan Note (Signed)
Dexa 04/2022 No falls or fx  D level is low - disc imp of extra D Getting back on track with exercise/enc strength training   Re check dexa at 1 y

## 2022-10-22 NOTE — Assessment & Plan Note (Signed)
Nl exam today  Mammogram utd 08/2022

## 2022-12-17 DIAGNOSIS — Z961 Presence of intraocular lens: Secondary | ICD-10-CM | POA: Diagnosis not present

## 2022-12-17 DIAGNOSIS — H5212 Myopia, left eye: Secondary | ICD-10-CM | POA: Diagnosis not present

## 2023-01-15 DIAGNOSIS — Z85828 Personal history of other malignant neoplasm of skin: Secondary | ICD-10-CM | POA: Diagnosis not present

## 2023-01-15 DIAGNOSIS — L570 Actinic keratosis: Secondary | ICD-10-CM | POA: Diagnosis not present

## 2023-01-15 DIAGNOSIS — L821 Other seborrheic keratosis: Secondary | ICD-10-CM | POA: Diagnosis not present

## 2023-01-15 DIAGNOSIS — D485 Neoplasm of uncertain behavior of skin: Secondary | ICD-10-CM | POA: Diagnosis not present

## 2023-01-15 DIAGNOSIS — D2271 Melanocytic nevi of right lower limb, including hip: Secondary | ICD-10-CM | POA: Diagnosis not present

## 2023-01-15 DIAGNOSIS — L988 Other specified disorders of the skin and subcutaneous tissue: Secondary | ICD-10-CM | POA: Diagnosis not present

## 2023-01-15 DIAGNOSIS — D225 Melanocytic nevi of trunk: Secondary | ICD-10-CM | POA: Diagnosis not present

## 2023-01-15 DIAGNOSIS — D171 Benign lipomatous neoplasm of skin and subcutaneous tissue of trunk: Secondary | ICD-10-CM | POA: Diagnosis not present

## 2023-02-27 ENCOUNTER — Ambulatory Visit: Payer: Medicare PPO | Admitting: Family Medicine

## 2023-02-27 ENCOUNTER — Encounter: Payer: Self-pay | Admitting: Family Medicine

## 2023-02-27 VITALS — BP 120/70 | HR 96 | Temp 97.8°F | Ht 59.75 in | Wt 126.4 lb

## 2023-02-27 DIAGNOSIS — J0110 Acute frontal sinusitis, unspecified: Secondary | ICD-10-CM | POA: Diagnosis not present

## 2023-02-27 DIAGNOSIS — R413 Other amnesia: Secondary | ICD-10-CM

## 2023-02-27 DIAGNOSIS — J019 Acute sinusitis, unspecified: Secondary | ICD-10-CM | POA: Insufficient documentation

## 2023-02-27 MED ORDER — PREDNISONE 10 MG PO TABS: ORAL_TABLET | ORAL | 0 refills | Status: DC

## 2023-02-27 MED ORDER — DOXYCYCLINE HYCLATE 100 MG PO TABS: 100.0000 mg | ORAL_TABLET | Freq: Two times a day (BID) | ORAL | 0 refills | Status: DC

## 2023-02-27 NOTE — Assessment & Plan Note (Signed)
Pt has over a month of nasal congestion /now with bilateral frontal headache/ facial pain and also right posterior headache Discussed symptoms control-see AVS Prednisone taper prescription/ reviewed side effects  Doxycycline for infection  Update if not starting to improve in a week or if worsening  Call back and Er precautions noted in detail today   Handout given    If not improved consider head imaging study for headache  Consider cxr of cough or wheezing continue

## 2023-02-27 NOTE — Patient Instructions (Signed)
Try some nasal saline spray  Drink lots of water Take doxycycline for sinus infection - start tonight  Take prednisone for sinus infection and congestion and wheezing -follow the directions  (It may make you feel hyper and hungry and anxious) - take that in the am   Update if not starting to improve in a week or if worsening    If any severe symptoms please go to the ER   If not helpful we will consider some imaging of head or chest or both so keep [place] posted

## 2023-02-27 NOTE — Progress Notes (Signed)
Subjective:    Patient ID: Bailey Stephens, female    DOB: 09-Mar-1946, 77 y.o.   MRN: [id number]  HPI  Wt Readings from Last 3 Encounters:  02/27/23 126 lb 6 oz (57.3 kg)  10/22/22 131 lb 6 oz (59.6 kg)  10/18/22 128 lb (58.1 kg)   24.89 kg/m  Vitals:   02/27/23 1508 02/27/23 1541  BP: (!) 144/90 120/70  Pulse: 96   Temp: 97.8 F (36.6 C)   SpO2: 98%     Pt presents for c/o headache, nasal congestion and cough  Memory problems   MMSE test in February was 29 out of 30 Forgot one of the 3 words  About the same- if worse it is names   Now having some headaches in right posterior head  Dull /not throbbing  A month  No trauma   Balance is fair -can still walk a mile No facial asymmetry  No speech problems   Also very congested  No sneezing or runny nse  About 6 weeks  No history of allergies - so far  Nothing comes out when she blows her nose  Cough- some phlegm (? From throat) -clear to cloudy  No blood  A little wheezing a little today  No shortness of breath  Ears are fine  Throat-feels like something is in it / scratchy    No fever  Some pain above eyes /face  More on the right side   No n/v/d    Did not do a covid test     Lab Results  Component Value Date   TSH 2.46 10/15/2022   Last vitamin D Lab Results  Component Value Date   VD25OH 26.81 (L) 10/15/2022   Lab Results  Component Value Date   HGBA1C 5.7 10/15/2022      Patient Active Problem List   Diagnosis Date Noted   Acute sinusitis 02/27/2023   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   Vaginal pain 05/31/2022   Encounter for screening mammogram for breast cancer 04/19/2022   Bilateral knee pain 10/10/2021   H/O fracture of rib 02/03/2019   Colon cancer screening 07/26/2017   Vitamin D deficiency 07/26/2017   Elevated glucose 07/21/2017   Contact dermatitis 02/29/2016   Hyperlipidemia 06/02/2015   Routine general medical examination at a health care facility 06/02/2015    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   Osteopenia 07/15/2014   Estrogen deficiency 06/07/2014   Abnormal breast biopsy 06/07/2014   Encounter for Medicare annual wellness exam 05/24/2013   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   Past Medical History:  Diagnosis Date   Back pain    due to large breasts   Fever blister    GERD (gastroesophageal reflux disease)    No pertinent past medical history    Overweight(278.02)    Past Surgical History:  Procedure Laterality Date   BREAST REDUCTION SURGERY  12/01/13   BUNIONECTOMY  1988   cataract surgery  2010   CESAREAN SECTION     MASS EXCISION  06/08/2011   Procedure: EXCISION MASS;  Surgeon: Wyn Forster., MD;  Location: [facility];  Service: Orthopedics;  Laterality: Right;  excisional biopsy of exostosis right thumb   REDUCTION MAMMAPLASTY Bilateral 2014   TONSILLECTOMY     Social History   Tobacco Use   Smoking status: Former    Current packs/day: 0.00    Types: Cigarettes    Quit date: 06/07/1975    Years since quitting: 51.7  Smokeless tobacco: Never   Tobacco comments:    quit 1976  Substance Use Topics   Alcohol use: Yes    Alcohol/week: 2.0 - 3.0 standard drinks of alcohol    Types: 2 - 3 Glasses of wine per week    Comment: occasional wine/alcohol   Drug use: No   Family History  Problem Relation Age of Onset   Asthma Mother    Cancer Father        brain tumor   Heart disease Maternal Grandfather        MI   Breast cancer Sister    Allergies  Allergen Reactions   Penicillins Other (See Comments)    Unsure; was told 40+ years ago: Has patient had a PCN reaction causing immediate rash, facial/tongue/throat swelling, SOB or lightheadedness with hypotension: Unknown Has patient had a PCN reaction causing severe rash involving mucus membranes or skin necrosis: Unknown Has patient had a PCN reaction that required hospitalization: Unknown Has patient had a PCN reaction  occurring within the last 10 years: No If all of the above answers are "NO", then may proceed with Cephalosporin use.    Current Outpatient Medications on File Prior to Visit  Medication Sig Dispense Refill   Calcium Carbonate-Vitamin D (CALCIUM + D PO) Take 1 capsule by mouth 2 (two) times daily.     Cholecalciferol (VITAMIN D) 1000 UNITS capsule Take 4,000 Units by mouth daily.     Multiple Vitamin (MULTIVITAMIN) tablet Take 1 tablet by mouth daily.     triamcinolone cream (KENALOG) 0.1 % APPLY TOPICALLYTO THE AFFECTED AREAS 2 (TWO) TIMES DAILY AS NEEDED for poison ivy 30 g 3   No current facility-administered medications on file prior to visit.    Review of Systems  Constitutional:  Negative for activity change, appetite change, fatigue, fever and unexpected weight change.  HENT:  Positive for congestion, postnasal drip, sinus pressure and sinus pain. Negative for ear discharge, ear pain, facial swelling, mouth sores, rhinorrhea and sore throat.   Eyes:  Negative for pain, redness and visual disturbance.  Respiratory:  Negative for cough, shortness of breath and wheezing.   Cardiovascular:  Negative for chest pain and palpitations.  Gastrointestinal:  Negative for abdominal pain, blood in stool, constipation and diarrhea.  Endocrine: Negative for polydipsia and polyuria.  Genitourinary:  Negative for dysuria, frequency and urgency.  Musculoskeletal:  Negative for arthralgias, back pain and myalgias.  Skin:  Negative for pallor and rash.  Allergic/Immunologic: Negative for environmental allergies.  Neurological:  Positive for headaches. Negative for dizziness and syncope.  Hematological:  Negative for adenopathy. Does not bruise/bleed easily.  Psychiatric/Behavioral:  Positive for decreased concentration. Negative for confusion and dysphoric mood. The patient is not nervous/anxious.        Objective:   Physical Exam Constitutional:      General: She is not in acute distress.     Appearance: Normal appearance. She is well-developed and normal weight. She is not ill-appearing or diaphoretic.  HENT:     Head: Normocephalic and atraumatic.     Comments: Bilateral  frontal sinus tenderness No occipital or temporal tenderness    Right Ear: Tympanic membrane, ear canal and external ear normal.     Left Ear: Tympanic membrane, ear canal and external ear normal.     Nose: Congestion and rhinorrhea present.     Mouth/Throat:     Pharynx: Oropharynx is clear. No oropharyngeal exudate or posterior oropharyngeal erythema.     Comments: Clear pnd  Eyes:     General:        Right eye: No discharge.        Left eye: No discharge.     Conjunctiva/sclera: Conjunctivae normal.     Pupils: Pupils are equal, round, and reactive to light.  Neck:     Thyroid: No thyromegaly.     Vascular: No carotid bruit or JVD.  Cardiovascular:     Rate and Rhythm: Normal rate and regular rhythm.     Heart sounds: Normal heart sounds.     No gallop.  Pulmonary:     Effort: Pulmonary effort is normal. No respiratory distress.     Breath sounds: Normal breath sounds. No stridor. No wheezing, rhonchi or rales.     Comments: Good air exch No rales or rhonchi Abdominal:     General: There is no distension or abdominal bruit.     Palpations: Abdomen is soft.  Musculoskeletal:     Cervical back: Normal range of motion and neck supple.     Right lower leg: No edema.     Left lower leg: No edema.  Lymphadenopathy:     Cervical: No cervical adenopathy.  Skin:    General: Skin is warm and dry.     Coloration: Skin is not pale.     Findings: No rash.  Neurological:     Mental Status: She is alert.     Cranial Nerves: No cranial nerve deficit.     Motor: No weakness.     Coordination: Coordination normal.     Gait: Gait normal.     Deep Tendon Reflexes: Reflexes are normal and symmetric. Reflexes normal.  Psychiatric:        Mood and Affect: Mood normal.     Comments: Mildly anxious             Assessment & Plan:   Problem List Items Addressed This Visit       Respiratory   Acute sinusitis - Primary    Pt has over a month of nasal congestion /now with bilateral frontal headache/ facial pain and also right posterior headache Discussed symptoms control-see AVS Prednisone taper prescription/ reviewed side effects  Doxycycline for infection  Update if not starting to improve in a week or if worsening  Call back and Er precautions noted in detail today   Handout given    If not improved consider head imaging study for headache  Consider cxr of cough or wheezing continue       Relevant Medications   doxycycline (VIBRA-TABS) 100 MG tablet   predniSONE (DELTASONE) 10 MG tablet     Other   Short-term memory loss    No significant change from last time Trying to keep brain and body active  Some headache (suspect from sinusitis)  If this does not improve with treatment of sinusitis then will consider MRI brain

## 2023-02-27 NOTE — Assessment & Plan Note (Signed)
No significant change from last time Trying to keep brain and body active  Some headache (suspect from sinusitis)  If this does not improve with treatment of sinusitis then will consider MRI brain

## 2023-07-23 ENCOUNTER — Other Ambulatory Visit: Payer: Self-pay | Admitting: Family Medicine

## 2023-07-23 DIAGNOSIS — Z0000 Encounter for general adult medical examination without abnormal findings: Secondary | ICD-10-CM

## 2023-08-22 ENCOUNTER — Ambulatory Visit: Payer: Medicare PPO

## 2023-08-26 ENCOUNTER — Ambulatory Visit
Admission: RE | Admit: 2023-08-26 | Discharge: 2023-08-26 | Disposition: A | Discharge status: Home / Self Care | Payer: Medicare PPO | Source: Ambulatory Visit | Attending: Family Medicine | Admitting: Family Medicine

## 2023-08-26 DIAGNOSIS — Z1231 Encounter for screening mammogram for malignant neoplasm of breast: Secondary | ICD-10-CM | POA: Diagnosis not present

## 2023-08-26 DIAGNOSIS — Z0000 Encounter for general adult medical examination without abnormal findings: Secondary | ICD-10-CM

## 2023-08-28 ENCOUNTER — Encounter: Payer: Self-pay | Admitting: Family Medicine

## 2023-09-16 ENCOUNTER — Encounter: Payer: Self-pay | Admitting: Family Medicine

## 2023-09-16 ENCOUNTER — Ambulatory Visit: Admitting: Family Medicine

## 2023-09-16 VITALS — BP 136/84 | HR 69 | Temp 98.5°F | Ht 59.75 in | Wt 128.5 lb

## 2023-09-16 DIAGNOSIS — R413 Other amnesia: Secondary | ICD-10-CM

## 2023-09-16 DIAGNOSIS — R0981 Nasal congestion: Secondary | ICD-10-CM | POA: Diagnosis not present

## 2023-09-16 DIAGNOSIS — S81802A Unspecified open wound, left lower leg, initial encounter: Secondary | ICD-10-CM | POA: Diagnosis not present

## 2023-09-16 DIAGNOSIS — Z23 Encounter for immunization: Secondary | ICD-10-CM

## 2023-09-16 DIAGNOSIS — R2689 Other abnormalities of gait and mobility: Secondary | ICD-10-CM | POA: Insufficient documentation

## 2023-09-16 MED ORDER — FLUTICASONE PROPIONATE 50 MCG/ACT NA SUSP: 2.0000 | Freq: Every day | NASAL | 11 refills | Status: AC

## 2023-09-16 NOTE — Assessment & Plan Note (Signed)
 When gardening/ very active Wants to prevent falls Reassuring exam  Ref to PT

## 2023-09-16 NOTE — Assessment & Plan Note (Signed)
 From a stick/gardening Does not appear to be infected   Encouraged to keep clean with soap and water Aquaphor or antibiotic oint prn  Call back and Er precautions noted in detail today   Td updated today

## 2023-09-16 NOTE — Assessment & Plan Note (Signed)
 Recommend trial of nasal steroid / flonase in season  Sent to pharmacy  Reassuring exam

## 2023-09-16 NOTE — Assessment & Plan Note (Signed)
 This seems to be fairly stable from a year ago  Occational word finding  Trouble remb names  No confusion  Pt is aware of it   Reassuring  We ran out of time for MMSE today but can do in future Call back and Er precautions noted in detail today

## 2023-09-16 NOTE — Patient Instructions (Addendum)
 Try flonase nasal spray for congestion  I sent it in   I put the referral in for physical therapy for balance  Please let us know if you don't hear in 1-2 weeks    Tetanus shot today   Keep walking  Add some strength training to your routine, this is important for bone and brain health and can reduce your risk of falls and help your body use insulin properly and regulate weight  Light weights, exercise bands , and internet videos are a good way to start  Yoga (chair or regular), machines , floor exercises or a gym with machines are also good options   Core strength is very important for balance    Avoid all caffeine -even in the am , if you have sleep problems  Decaf options are fine  Read before bed instead of using screens   Benadryl is a little risky in terms of fall risk Use your best judgement   For memory Keep socializing  Keep playing cards  Keep reading ! Let us know if something changes

## 2023-09-16 NOTE — Progress Notes (Signed)
 Subjective:    Patient ID: Bailey Stephens, female    DOB: 1945/10/23, 78 y.o.   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  Last tetanus shot 2011  Needs update   Has a big problem remembering names  Word finding - things come back to her later in the   Stuffy nose for 2 months   Balance is not as good  Notices this when gardening  Walks for exercise     18-21 miles per week  No strength training    Went through a period of issues with sleep  Advil pm    MMSE score 29 out of 30 a year ago  Not getting lost in familiar places Not confused  Those around her do not notice   Reads a lot  Socializes and plays cards    Traveling a lot  Lots of friends   Patient Active Problem List   Diagnosis Date Noted   Wound of left leg, initial encounter 09/16/2023   Nasal congestion 09/16/2023   Poor balance 09/16/2023   Acute sinusitis 02/27/2023   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   Vaginal pain 05/31/2022   Encounter for screening mammogram for breast cancer 04/19/2022   Bilateral knee pain 10/10/2021   H/O fracture of rib 02/03/2019   Colon cancer screening 07/26/2017   Vitamin D deficiency 07/26/2017   Elevated glucose 07/21/2017   Contact dermatitis 02/29/2016   Hyperlipidemia 06/02/2015   Routine general medical examination at a health care facility 06/02/2015   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   Osteopenia 07/15/2014   Estrogen deficiency 06/07/2014   Abnormal breast biopsy 06/07/2014   Encounter for  Medicare annual wellness exam 05/24/2013   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   Past Medical History:  Diagnosis Date   Back pain    due to large breasts   Fever blister    GERD (gastroesophageal reflux disease)    No pertinent past medical history    Overweight(278.02)    Past Surgical History:  Procedure Laterality Date   BREAST REDUCTION SURGERY  12/01/13   BUNIONECTOMY  1988   cataract surgery  2010   CESAREAN SECTION     MASS EXCISION  06/08/2011   Procedure: EXCISION MASS;  Surgeon: Wyn Forster., MD;  Location:  [facility];  Service: Orthopedics;  Laterality: Right;  excisional biopsy of exostosis right thumb   REDUCTION MAMMAPLASTY Bilateral 2014   TONSILLECTOMY     Social History   Tobacco Use   Smoking status: Former    Current packs/day: 0.00    Types: Cigarettes    Quit date: 06/07/1975    Years since quitting: 48.3   Smokeless tobacco: Never   Tobacco comments:    quit 1976  Substance Use Topics   Alcohol use: Yes    Alcohol/week: 2.0 - 3.0 standard drinks of alcohol    Types: 2 -  3 Glasses of wine per week    Comment: occasional wine/alcohol   Drug use: No   Family History  Problem Relation Age of Onset   Asthma Mother    Cancer Father        brain tumor   Heart disease Maternal Grandfather        MI   Breast cancer Sister    Allergies  Allergen Reactions   Penicillins Other (See Comments)    Unsure; was told 40+ years ago: Has patient had a PCN reaction causing immediate rash, facial/tongue/throat swelling, SOB or lightheadedness with hypotension: Unknown Has patient had a PCN reaction causing severe rash involving mucus membranes or skin necrosis: Unknown Has patient had a PCN reaction that required hospitalization: Unknown Has patient had a PCN reaction occurring within the last 10 years: No If all of the above answers are "NO", then may proceed with Cephalosporin use.    Current Outpatient Medications on File  Prior to Visit  Medication Sig Dispense Refill   Calcium Carbonate-Vitamin D (CALCIUM + D PO) Take 1 capsule by mouth 2 (two) times daily.     Cholecalciferol (VITAMIN D) 1000 UNITS capsule Take 4,000 Units by mouth daily.     Multiple Vitamin (MULTIVITAMIN) tablet Take 1 tablet by mouth daily.     triamcinolone cream (KENALOG) 0.1 % APPLY TOPICALLYTO THE AFFECTED AREAS 2 (TWO) TIMES DAILY AS NEEDED for poison ivy 30 g 3   No current facility-administered medications on file prior to visit.    Review of Systems  Constitutional:  Negative for activity change, appetite change, fatigue, fever and unexpected weight change.  HENT:  Positive for congestion. Negative for ear pain, rhinorrhea, sinus pressure and sore throat.   Eyes:  Negative for pain, redness and visual disturbance.  Respiratory:  Negative for cough, shortness of breath and wheezing.   Cardiovascular:  Negative for chest pain and palpitations.  Gastrointestinal:  Negative for abdominal pain, blood in stool, constipation and diarrhea.  Endocrine: Negative for polydipsia and polyuria.  Genitourinary:  Negative for dysuria, frequency and urgency.  Musculoskeletal:  Negative for arthralgias, back pain and myalgias.  Skin:  Positive for wound. Negative for pallor and rash.  Allergic/Immunologic: Negative for environmental allergies.  Neurological:  Negative for dizziness, syncope and headaches.  Hematological:  Negative for adenopathy. Does not bruise/bleed easily.  Psychiatric/Behavioral:  Negative for decreased concentration and dysphoric mood. The patient is not nervous/anxious.        Objective:   Physical Exam Constitutional:      General: She is not in acute distress.    Appearance: Normal appearance. She is well-developed and normal weight. She is not ill-appearing or diaphoretic.  HENT:     Head: Normocephalic and atraumatic.     Nose: Congestion present.     Comments: Mild nasal congestion  Clear mucous  No sinus  tenderness Eyes:     General:        Right eye: No discharge.        Left eye: No discharge.     Conjunctiva/sclera: Conjunctivae normal.     Pupils: Pupils are equal, round, and reactive to light.  Neck:     Thyroid: No thyromegaly.     Vascular: No carotid bruit or JVD.  Cardiovascular:     Rate and Rhythm: Normal rate and regular rhythm.     Heart sounds: Normal heart sounds.     No gallop.  Pulmonary:     Effort: Pulmonary effort is normal.  No respiratory distress.     Breath sounds: Normal breath sounds. No wheezing or rales.  Abdominal:     General: There is no distension or abdominal bruit.     Palpations: Abdomen is soft.  Musculoskeletal:     Cervical back: Normal range of motion and neck supple.     Right lower leg: No edema.     Left lower leg: No edema.  Lymphadenopathy:     Cervical: No cervical adenopathy.  Skin:    General: Skin is warm and dry.     Coloration: Skin is not pale.     Findings: No rash.     Comments: 1 cm dark scab with scant (1-2 mm) of erythema surrounding on left lower leg/shin  No signs and symptoms of infection  No drainage Not tender   Neurological:     Mental Status: She is alert.     Cranial Nerves: No cranial nerve deficit or facial asymmetry.     Motor: No weakness, tremor, atrophy, abnormal muscle tone or pronator drift.     Coordination: Romberg sign negative. Coordination normal.     Gait: Gait normal.     Deep Tendon Reflexes: Reflexes are normal and symmetric. Reflexes normal.  Psychiatric:        Attention and Perception: Attention normal.        Mood and Affect: Mood normal.        Behavior: Behavior normal.        Thought Content: Thought content normal.        Cognition and Memory: Cognition and memory normal.     Comments: Very mentally sharp  Candidly discusses symptoms and stressors             Assessment & Plan:   Problem List Items Addressed This Visit       Other   Wound of left leg, initial  encounter - Primary   From a stick/gardening Does not appear to be infected   Encouraged to keep clean with soap and water Aquaphor or antibiotic oint prn  Call back and Er precautions noted in detail today   Td updated today      Relevant Orders   Td : Tetanus/diphtheria >7yo Preservative  free (Completed)   Short-term memory loss   This seems to be fairly stable from a year ago  Occational word finding  Trouble remb names  No confusion  Pt is aware of it   Reassuring  We ran out of time for MMSE today but can do in future Call back and Er precautions noted in detail today        Poor balance   When gardening/ very active Wants to prevent falls Reassuring exam  Ref to PT       Relevant Orders   Ambulatory referral to Physical Therapy   Nasal congestion   Recommend trial of nasal steroid / flonase in season  Sent to pharmacy  Reassuring exam

## 2023-10-28 ENCOUNTER — Ambulatory Visit (INDEPENDENT_AMBULATORY_CARE_PROVIDER_SITE_OTHER)

## 2023-10-28 VITALS — BP 136/84 | Ht 59.0 in | Wt 126.0 lb

## 2023-10-28 DIAGNOSIS — Z2821 Immunization not carried out because of patient refusal: Secondary | ICD-10-CM | POA: Diagnosis not present

## 2023-10-28 DIAGNOSIS — Z0000 Encounter for general adult medical examination without abnormal findings: Secondary | ICD-10-CM | POA: Diagnosis not present

## 2023-10-28 NOTE — Progress Notes (Signed)
 Because this visit was a virtual/telehealth visit,  certain criteria was not obtained, such a blood pressure, CBG if applicable, and timed get up and go. Any medications not marked as "taking" were not mentioned during the medication reconciliation part of the visit. Any vitals not documented were not able to be obtained due to this being a telehealth visit or patient was unable to self-report a recent blood pressure reading due to a lack of equipment at home via telehealth. Vitals that have been documented are verbally provided by the patient.  Subjective:   Bailey Stephens is a 78 y.o. who presents for a Medicare Wellness preventive visit.  Visit Complete: Virtual I connected with  [name] on 10/28/23 by a audio enabled telemedicine application and verified that I am speaking with the correct person using two identifiers.  Patient Location: Home  Provider Location: Home Office  I discussed the limitations of evaluation and management by telemedicine. The patient expressed understanding and agreed to proceed.  Vital Signs: Because this visit was a virtual/telehealth visit, some criteria may be missing or patient reported. Any vitals not documented were not able to be obtained and vitals that have been documented are patient reported.  VideoDeclined- This patient declined Librarian, academic. Therefore the visit was completed with audio only.  Persons Participating in Visit: Patient.  AWV Questionnaire: No: Patient Medicare AWV questionnaire was not completed prior to this visit.  Cardiac Risk Factors include: advanced age (>38men, >30 women);dyslipidemia     Objective:    Today's Vitals   10/28/23 1244  BP: 136/84  Weight: 126 lb (57.2 kg)  Height: 4\' 11"  (1.499 m)   Body mass index is 25.45 kg/m.     10/28/2023   12:43 PM 10/18/2022    8:44 AM 10/05/2021   11:13 AM 08/14/2019   10:30 AM 01/25/2019    9:36 PM 07/23/2017    8:28 AM 05/06/2017     1:00 PM  Advanced Directives  Does Patient Have a Medical Advance Directive? Yes Yes Yes Yes No Yes No  Type of Estate agent of [place];Living will Healthcare Power of [place];Living will Healthcare Power of [employer] of [place];Living will  Healthcare Power of [place];Living will   Does patient want to make changes to medical advance directive? No - Patient declined        Copy of Healthcare Power of Attorney in Chart? No - copy requested No - copy requested Yes - validated most recent copy scanned in chart (See row information) No - copy requested  No - copy requested   Would patient like information on creating a medical advance directive?     No - Patient declined      Current Medications (verified) Outpatient Encounter Medications as of 10/28/2023  Medication Sig   Calcium Carbonate-Vitamin D  (CALCIUM + D PO) Take 1 capsule by mouth 2 (two) times daily.   Cholecalciferol (VITAMIN D ) 1000 UNITS capsule Take 4,000 Units by mouth daily.   fluticasone  (FLONASE ) 50 MCG/ACT nasal spray Place 2 sprays into both nostrils daily.   Multiple Vitamin (MULTIVITAMIN) tablet Take 1 tablet by mouth daily.   triamcinolone  cream (KENALOG ) 0.1 % APPLY TOPICALLYTO THE AFFECTED AREAS 2 (TWO) TIMES DAILY AS NEEDED for poison ivy (Patient not taking: Reported on 10/28/2023)   No facility-administered encounter medications on file as of 10/28/2023.    Allergies (verified) Penicillins   History: Past Medical History:  Diagnosis Date   Back pain  due to large breasts   Fever blister    GERD (gastroesophageal reflux disease)    No pertinent past medical history    Overweight(278.02)    Past Surgical History:  Procedure Laterality Date   BREAST REDUCTION SURGERY  12/01/13   BUNIONECTOMY  1988   cataract surgery  2010   CESAREAN SECTION     MASS EXCISION  06/08/2011   Procedure: EXCISION MASS;  Surgeon: Amelie Baize., MD;  Location: [facility];  Service: Orthopedics;  Laterality: Right;  excisional biopsy of exostosis right thumb   REDUCTION MAMMAPLASTY Bilateral 2014   TONSILLECTOMY     Family History  Problem Relation Age of Onset   Asthma Mother    Cancer Father        brain tumor   Heart disease Maternal Grandfather        MI   Breast cancer Sister    Social History   Socioeconomic History   Marital status: Married    Spouse name: Not on file   Number of children: Not on file   Years of education: Not on file   Highest education level: Not on file  Occupational History   Not on file  Tobacco Use   Smoking status: Former    Current packs/day: 0.00    Types: Cigarettes    Quit date: 06/07/1975    Years since quitting: 48.4   Smokeless tobacco: Never   Tobacco comments:    quit 1976  Substance and Sexual Activity   Alcohol use: Yes    Alcohol/week: 2.0 - 3.0 standard drinks of alcohol    Types: 2 - 3 Glasses of wine per week    Comment: occasional wine/alcohol   Drug use: No   Sexual activity: Yes    Birth control/protection: None    Comment: Married  Other Topics Concern   Not on file  Social History Narrative   Not on file   Social Drivers of Health   Financial Resource Strain: Low Risk  (10/28/2023)   Overall Financial Resource Strain (CARDIA)    Difficulty of Paying Living Expenses: Not hard at all  Food Insecurity: No Food Insecurity (10/28/2023)   Hunger Vital Sign    Worried About Running Out of Food in the Last Year: Never true    Ran Out of Food in the Last Year: Never true  Transportation Needs: No Transportation Needs (10/28/2023)   PRAPARE - Administrator, Civil Service (Medical): No    Lack of Transportation (Non-Medical): No  Physical Activity: Insufficiently Active (10/28/2023)   Exercise Vital Sign    Days of Exercise per Week: 5 days    Minutes of Exercise per Session: 20 min  Stress: No Stress Concern Present (10/28/2023)   [employer] of Occupational  Health - Occupational Stress Questionnaire    Feeling of Stress : Not at all  Social Connections: Moderately Integrated (10/28/2023)   Social Connection and Isolation Panel [NHANES]    Frequency of Communication with Friends and Family: More than three times a week    Frequency of Social Gatherings with Friends and Family: More than three times a week    Attends Religious Services: Never    Database administrator or Organizations: Yes    Attends Engineer, structural: More than 4 times per year    Marital Status: Married    Tobacco Counseling Counseling given: Not Answered Tobacco comments: quit 1976    Clinical Intake:  Pre-visit preparation completed: Yes  Pain : No/denies pain     BMI - recorded: 25.45 Nutritional Status: BMI 25 -29 Overweight Nutritional Risks: None Diabetes: No  Lab Results  Component Value Date   HGBA1C 5.7 10/15/2022   HGBA1C 5.8 10/03/2021   HGBA1C 5.6 08/22/2020     How often do you need to have someone help you when you read instructions, pamphlets, or other written materials from your doctor or pharmacy?: 1 - Never What is the last grade level you completed in school?: college graduate     Information entered by :: Laylana Gerwig,CMA   Activities of Daily Living     10/28/2023   12:49 PM  In your present state of health, do you have any difficulty performing the following activities:  Hearing? 0  Vision? 0  Difficulty concentrating or making decisions? 0  Walking or climbing stairs? 0  Dressing or bathing? 0  Doing errands, shopping? 0  Preparing Food and eating ? N  Using the Toilet? N  In the past six months, have you accidently leaked urine? N  Do you have problems with loss of bowel control? N  Managing your Medications? N  Managing your Finances? N  Housekeeping or managing your Housekeeping? N    Patient Care Team: Tower, Manley Seeds, MD as PCP - Vernard Goldberg, MD as Consulting Physician  (Ophthalmology)  Indicate any recent Medical Services you may have received from other than [facility] providers in the past year (date may be approximate).     Assessment:   This is a routine wellness examination for [place].  Hearing/Vision screen Hearing Screening - Comments:: No hearing difficulties Vision Screening - Comments:: No vision issues.   Goals Addressed             This Visit's Progress    Patient Stated       Patient would like to lose 6 lbs       Depression Screen     10/28/2023   12:52 PM 09/16/2023   10:26 AM 02/27/2023    3:18 PM 10/18/2022    8:44 AM 09/07/2022    9:48 AM 08/29/2022   10:33 AM 10/05/2021   11:17 AM  PHQ 2/9 Scores  PHQ - 2 Score 0 0 0 0 0 0 0  PHQ- 9 Score 0 1 2 0 0 0     Fall Risk     10/28/2023   12:48 PM 10/18/2022    8:45 AM 08/29/2022   10:33 AM 10/05/2021   11:13 AM 08/29/2020   11:29 AM  Fall Risk   Falls in the past year? 1 0 0 0 0  Number falls in past yr: 0 0 0 0   Injury with Fall? 0 0 0 0   Risk for fall due to : History of fall(s);No Fall Risks No Fall Risks No Fall Risks    Follow up Falls evaluation completed Falls prevention discussed Falls evaluation completed Education provided;Falls evaluation completed Falls evaluation completed    MEDICARE RISK AT HOME:  Medicare Risk at Home Any stairs in or around the home?: Yes If so, are there any without handrails?: No Home free of loose throw rugs in walkways, pet beds, electrical cords, etc?: Yes Adequate lighting in your home to reduce risk of falls?: Yes Life alert?: Yes Use of a cane, walker or w/c?: No Grab bars in the bathroom?: Yes Shower chair or bench in shower?: No Elevated toilet seat or a handicapped toilet?: Yes  TIMED UP AND GO:  Was the test performed?  No  Cognitive Function: 6CIT completed    08/14/2019   10:34 AM 07/23/2017    8:28 AM 07/23/2016    1:57 PM  MMSE - Mini Mental State Exam  Orientation to time 5 5 5   Orientation to Place 5 5 5    Registration 3 3 3   Attention/ Calculation 5 0 0  Recall 3 3 3   Language- name 2 objects  0 0  Language- repeat 1 1 1   Language- follow 3 step command  3 3  Language- read & follow direction  0 0  Write a sentence  0 0  Copy design  0 0  Total score  20 20        10/28/2023   12:46 PM 10/18/2022    8:46 AM 10/05/2021   11:16 AM  6CIT Screen  What Year? 0 points 0 points 0 points  What month? 0 points 0 points 0 points  What time? 0 points 0 points 0 points  Count back from 20 2 points 0 points 0 points  Months in reverse 4 points 2 points 0 points  Repeat phrase 6 points 4 points 0 points  Total Score 12 points 6 points 0 points    Immunizations Immunization History  Administered Date(s) Administered   Fluad Quad(high Dose 65+) 02/28/2021   Influenza Split 05/01/2011   Influenza Whole 05/10/2010   Influenza, High Dose Seasonal PF 04/30/2018, 04/05/2022, 04/01/2023   Influenza,inj,Quad PF,6+ Mos 06/05/2013, 04/23/2014, 06/03/2015, 04/06/2016, 04/18/2017, 02/24/2019   PFIZER(Purple Top)SARS-COV-2 Vaccination 07/23/2019, 08/13/2019   Pfizer Covid Bivalent Pediatric Vaccine(58mos to <27yrs) 04/05/2022   Pfizer(Comirnaty)Fall Seasonal Vaccine 12 years and older 04/01/2023   Pneumococcal Conjugate-13 06/07/2014   Pneumococcal Polysaccharide-23 08/11/2009   Td 08/11/2009, 09/16/2023   Zoster Recombinant(Shingrix) 02/17/2018, 04/18/2018   Zoster, Live 05/28/2012    Screening Tests Health Maintenance  Topic Date Due   COVID-19 Vaccine (8 - 2024-25 season) 09/29/2023   INFLUENZA VACCINE  01/31/2024   MAMMOGRAM  08/25/2024   Medicare Annual Wellness (AWV)  10/27/2024   Pneumonia Vaccine 75+ Years old  Completed   DEXA SCAN  Completed   Zoster Vaccines- Shingrix  Completed   HPV VACCINES  Aged Out   Meningococcal B Vaccine  Aged Out   DTaP/Tdap/Td  Discontinued   Colonoscopy  Discontinued   Hepatitis C Screening  Discontinued   Fecal DNA (Cologuard)  Discontinued     Health Maintenance  Health Maintenance Due  Topic Date Due   COVID-19 Vaccine (8 - 2024-25 season) 09/29/2023   Health Maintenance Items Addressed:declined vaccination  Additional Screening:  Vision Screening: Recommended annual ophthalmology exams for early detection of glaucoma and other disorders of the eye.  Dental Screening: Recommended annual dental exams for proper oral hygiene  Community Resource Referral / Chronic Care Management: CRR required this visit?  No   CCM required this visit?  No     Plan:     I have personally reviewed and noted the following in the patient's chart:   Medical and social history Use of alcohol, tobacco or illicit drugs  Current medications and supplements including opioid prescriptions. Patient is not currently taking opioid prescriptions. Functional ability and status Nutritional status Physical activity Advanced directives List of other physicians Hospitalizations, surgeries, and ER visits in previous 12 months Vitals Screenings to include cognitive, depression, and falls Referrals and appointments  In addition, I have reviewed and discussed with patient certain preventive protocols, quality metrics,  and best practice recommendations. A written personalized care plan for preventive services as well as general preventive health recommendations were provided to patient.     Freeda Jerry, [place]   10/28/2023   After Visit Summary: (MyChart) Due to this being a telephonic visit, the after visit summary with patients personalized plan was offered to patient via MyChart   Notes: Nothing significant to report at this time.

## 2023-10-28 NOTE — Patient Instructions (Signed)
 Bailey Stephens , Thank you for taking time to come for your Medicare Wellness Visit. I appreciate your ongoing commitment to your health goals. Please review the following plan we discussed and let me know if I can assist you in the future.   Referrals/Orders/Follow-Ups/Clinician Recommendations: follow up as scheduled for next annual wellness visit.  This is a list of the screening recommended for you and due dates:  Health Maintenance  Topic Date Due   COVID-19 Vaccine (8 - 2024-25 season) 09/29/2023   Flu Shot  01/31/2024   Mammogram  08/25/2024   Medicare Annual Wellness Visit  10/27/2024   Pneumonia Vaccine  Completed   DEXA scan (bone density measurement)  Completed   Zoster (Shingles) Vaccine  Completed   HPV Vaccine  Aged Out   Meningitis B Vaccine  Aged Out   DTaP/Tdap/Td vaccine  Discontinued   Colon Cancer Screening  Discontinued   Hepatitis C Screening  Discontinued   Cologuard (Stool DNA test)  Discontinued    Advanced directives: (Declined) Advance directive discussed with you today. Even though you declined this today, please call our office should you change your mind, and we can give you the proper paperwork for you to fill out.  Next Medicare Annual Wellness Visit scheduled for next year: Yes

## 2023-11-29 ENCOUNTER — Telehealth: Payer: Self-pay | Admitting: Family Medicine

## 2023-11-29 DIAGNOSIS — Z1211 Encounter for screening for malignant neoplasm of colon: Secondary | ICD-10-CM

## 2023-11-29 NOTE — Telephone Encounter (Signed)
 I put the order in.  Thanks

## 2023-11-29 NOTE — Telephone Encounter (Signed)
 Copied from CRM [id number]. Topic: Appointments - Appointment Scheduling >> Nov 29, 2023 10:59 AM Lajean Pike wrote: Patient stated that she received her cologuard in the mail and is inquiring to schedule an appointment for colon cancer screening.

## 2023-11-29 NOTE — Telephone Encounter (Signed)
 Called pt to clarify prev message. Pt said she received a letter from [employer] saying it's been 3 years and it's time for her next cologuard. Pt is just asking if PCP can place the order so they will send her to kit.

## 2023-11-29 NOTE — Addendum Note (Signed)
 Addended by: Deri Fleet A on: 11/29/2023 12:16 PM   Modules accepted: Orders

## 2023-12-01 ENCOUNTER — Telehealth: Payer: Self-pay | Admitting: Family Medicine

## 2023-12-01 DIAGNOSIS — E7800 Pure hypercholesterolemia, unspecified: Secondary | ICD-10-CM

## 2023-12-01 DIAGNOSIS — E559 Vitamin D deficiency, unspecified: Secondary | ICD-10-CM

## 2023-12-01 DIAGNOSIS — R7303 Prediabetes: Secondary | ICD-10-CM

## 2023-12-01 NOTE — Telephone Encounter (Signed)
-----   Message from Gerry Krone sent at 11/18/2023 11:11 AM EDT ----- Regarding: Lab orders for Wed, 6.4.25 Patient is scheduled for CPX labs, please order future labs, Thanks , Anselmo Kings

## 2023-12-04 ENCOUNTER — Other Ambulatory Visit (INDEPENDENT_AMBULATORY_CARE_PROVIDER_SITE_OTHER)

## 2023-12-04 ENCOUNTER — Ambulatory Visit: Payer: Self-pay | Admitting: Family Medicine

## 2023-12-04 DIAGNOSIS — E7800 Pure hypercholesterolemia, unspecified: Secondary | ICD-10-CM

## 2023-12-04 DIAGNOSIS — R7303 Prediabetes: Secondary | ICD-10-CM | POA: Diagnosis not present

## 2023-12-04 DIAGNOSIS — E559 Vitamin D deficiency, unspecified: Secondary | ICD-10-CM

## 2023-12-04 LAB — COMPREHENSIVE METABOLIC PANEL WITH GFR
ALT: 11 U/L (ref 0–35)
AST: 14 U/L (ref 0–37)
Albumin: 4.2 g/dL (ref 3.5–5.2)
Alkaline Phosphatase: 51 U/L (ref 39–117)
BUN: 15 mg/dL (ref 6–23)
CO2: 29 meq/L (ref 19–32)
Calcium: 9.6 mg/dL (ref 8.4–10.5)
Chloride: 106 meq/L (ref 96–112)
Creatinine, Ser: 0.93 mg/dL (ref 0.40–1.20)
GFR: 58.16 mL/min — ABNORMAL LOW (ref >60.00)
Glucose, Bld: 87 mg/dL (ref 70–99)
Potassium: 4.3 meq/L (ref 3.5–5.1)
Sodium: 141 meq/L (ref 135–145)
Total Bilirubin: 0.7 mg/dL (ref 0.2–1.2)
Total Protein: 6.4 g/dL (ref 6.0–8.3)

## 2023-12-04 LAB — LIPID PANEL
Cholesterol: 167 mg/dL (ref 0–200)
HDL: 81.2 mg/dL (ref >39.00)
LDL Cholesterol: 73 mg/dL (ref 0–99)
NonHDL: 86.13
Total CHOL/HDL Ratio: 2
Triglycerides: 67 mg/dL (ref 0.0–149.0)
VLDL: 13.4 mg/dL (ref 0.0–40.0)

## 2023-12-04 LAB — TSH: TSH: 1.86 u[IU]/mL (ref 0.35–5.50)

## 2023-12-04 LAB — HEMOGLOBIN A1C: Hgb A1c MFr Bld: 5.7 % (ref 4.6–6.5)

## 2023-12-04 LAB — VITAMIN D 25 HYDROXY (VIT D DEFICIENCY, FRACTURES): VITD: 34.72 ng/mL (ref 30.00–100.00)

## 2023-12-10 ENCOUNTER — Ambulatory Visit: Payer: Self-pay | Admitting: Family Medicine

## 2023-12-10 LAB — COLOGUARD

## 2023-12-11 ENCOUNTER — Ambulatory Visit (INDEPENDENT_AMBULATORY_CARE_PROVIDER_SITE_OTHER): Admitting: Family Medicine

## 2023-12-11 ENCOUNTER — Encounter: Payer: Self-pay | Admitting: Family Medicine

## 2023-12-11 VITALS — BP 112/70 | HR 70 | Temp 97.6°F | Ht 59.25 in | Wt 126.0 lb

## 2023-12-11 DIAGNOSIS — E559 Vitamin D deficiency, unspecified: Secondary | ICD-10-CM | POA: Diagnosis not present

## 2023-12-11 DIAGNOSIS — Z1211 Encounter for screening for malignant neoplasm of colon: Secondary | ICD-10-CM

## 2023-12-11 DIAGNOSIS — R0981 Nasal congestion: Secondary | ICD-10-CM

## 2023-12-11 DIAGNOSIS — R7303 Prediabetes: Secondary | ICD-10-CM | POA: Diagnosis not present

## 2023-12-11 DIAGNOSIS — Z0000 Encounter for general adult medical examination without abnormal findings: Secondary | ICD-10-CM | POA: Diagnosis not present

## 2023-12-11 DIAGNOSIS — E7800 Pure hypercholesterolemia, unspecified: Secondary | ICD-10-CM | POA: Diagnosis not present

## 2023-12-11 DIAGNOSIS — R2689 Other abnormalities of gait and mobility: Secondary | ICD-10-CM

## 2023-12-11 DIAGNOSIS — M8588 Other specified disorders of bone density and structure, other site: Secondary | ICD-10-CM | POA: Diagnosis not present

## 2023-12-11 DIAGNOSIS — N6099 Unspecified benign mammary dysplasia of unspecified breast: Secondary | ICD-10-CM

## 2023-12-11 NOTE — Assessment & Plan Note (Signed)
 Encouraged to start working on strength-especially core Discussed plan for exercise -see avs

## 2023-12-11 NOTE — Assessment & Plan Note (Signed)
 Disc goals for lipids and reasons to control them Rev last labs with pt Rev low sat fat diet in detail Well controlled with diet  Improved from last year  LDL of 73 and HDL 81.2

## 2023-12-11 NOTE — Assessment & Plan Note (Signed)
 Using flonase  in season

## 2023-12-11 NOTE — Assessment & Plan Note (Signed)
 Cologuard sample was damaged in the mail   She re did it  Pending result   Last was normal in 2022

## 2023-12-11 NOTE — Assessment & Plan Note (Signed)
 Gets exam every 6 months Dense tissue  Mammogram utd from 08/2023   -yearly

## 2023-12-11 NOTE — Assessment & Plan Note (Signed)
 Will be due for dexa in October [facility] Instructed to call in the fall to set up elsewhere/ she is open to [place]  No fracture  One fall-discussed prevention  Discussed fall prevention, supplements and exercise for bone density  Plans to add some strength training-especially for core and upper body Discussed options

## 2023-12-11 NOTE — Progress Notes (Signed)
 Subjective:    Patient ID: Bailey Stephens, female    DOB: [birth year], 78 y.o.   MRN: [id number]  HPI  Here for health maintenance exam and to review chronic medical problems   Wt Readings from Last 3 Encounters:  12/11/23 126 lb (57.2 kg)  10/28/23 126 lb (57.2 kg)  09/16/23 128 lb 8 oz (58.3 kg)   25.23 kg/m  Vitals:   12/11/23 0955  BP: 112/70  Pulse: 70  Temp: 97.6 F (36.4 C)  SpO2: 98%    Immunization History  Administered Date(s) Administered   Fluad Quad(high Dose 65+) 02/28/2021   Influenza Split 05/01/2011   Influenza Whole 05/10/2010   Influenza, High Dose Seasonal PF 04/30/2018, 04/05/2022, 04/01/2023   Influenza,inj,Quad PF,6+ Mos 06/05/2013, 04/23/2014, 06/03/2015, 04/06/2016, 04/18/2017, 02/24/2019   PFIZER(Purple Top)SARS-COV-2 Vaccination 07/23/2019, 08/13/2019   Pfizer Covid Bivalent Pediatric Vaccine(48mos to <41yrs) 04/05/2022   Pfizer(Comirnaty)Fall Seasonal Vaccine 12 years and older 04/01/2023   Pneumococcal Conjugate-13 06/07/2014   Pneumococcal Polysaccharide-23 08/11/2009   Td 08/11/2009, 09/16/2023   Zoster Recombinant(Shingrix) 02/17/2018, 04/18/2018   Zoster, Live 05/28/2012    There are no preventive care reminders to display for this patient.   Mammogram 08/2023  History of atypical lobular hyperplasia of breast (noted incidentally with breast reduction surgery in past) Self breast exam-no lumps or changes   Gyn health Does not seen gyn  No problems    Colon cancer screening -cologuard neg 08/2020 - did this again and the sample could not be processed  Is re doing it    Bone health  Dexa 04/2022  osteopenia  Falls-one fall in her yard -leaned over to pull a weed/fell on arm -no fracture   Fractures-none  Supplements  Last vitamin D  Lab Results  Component Value Date   VD25OH 34.72 12/04/2023    Exercise  Very active  Gardening  Needs strength training    Mood    12/11/2023    9:59 AM 10/28/2023   12:52 PM  09/16/2023   10:26 AM 02/27/2023    3:18 PM 10/18/2022    8:44 AM  Depression screen PHQ 2/9  Decreased Interest 0 0 0 0 0  Down, Depressed, Hopeless 0 0 0 0 0  PHQ - 2 Score 0 0 0 0 0  Altered sleeping 1 0 1 1 0  Tired, decreased energy 0 0 0 1 0  Change in appetite 0 0 0 0 0  Feeling bad or failure about yourself  0 0 0 0 0  Trouble concentrating 0 0 0 0 0  Moving slowly or fidgety/restless 0 0 0 0 0  Suicidal thoughts 0 0 0 0 0  PHQ-9 Score 1 0 1 2 0  Difficult doing work/chores Not difficult at all Not difficult at all Somewhat difficult Somewhat difficult Not difficult at all    Hyperlipidemia  Lab Results  Component Value Date   CHOL 167 12/04/2023   CHOL 182 10/15/2022   CHOL 176 10/03/2021   Lab Results  Component Value Date   HDL 81.20 12/04/2023   HDL 77.90 10/15/2022   HDL 68.30 10/03/2021   Lab Results  Component Value Date   LDLCALC 73 12/04/2023   LDLCALC 82 10/15/2022   LDLCALC 87 10/03/2021   Lab Results  Component Value Date   TRIG 67.0 12/04/2023   TRIG 108.0 10/15/2022   TRIG 107.0 10/03/2021   Lab Results  Component Value Date   CHOLHDL 2 12/04/2023   CHOLHDL 2 10/15/2022  CHOLHDL 3 10/03/2021   No results found for: LDLDIRECT Diet controlled LDL is down , HDL is up  Good eating habit   Prediabetes Lab Results  Component Value Date   HGBA1C 5.7 12/04/2023   HGBA1C 5.7 10/15/2022   HGBA1C 5.8 10/03/2021   Stable  Does not eat a lot of added sugar  Ice cream twice weekly   Has lost 6 lb in 2 years    Other labs  Lab Results  Component Value Date   NA 141 12/04/2023   K 4.3 12/04/2023   CO2 29 12/04/2023   GLUCOSE 87 12/04/2023   BUN 15 12/04/2023   CREATININE 0.93 12/04/2023   CALCIUM 9.6 12/04/2023   GFR 58.16 (L) 12/04/2023   GFRNONAA >60 05/06/2017  GFR down from 67  No nsaids   Lab Results  Component Value Date   ALT 11 12/04/2023   AST 14 12/04/2023   ALKPHOS 51 12/04/2023   BILITOT 0.7 12/04/2023     Lab Results  Component Value Date   TSH 1.86 12/04/2023     Patient Active Problem List   Diagnosis Date Noted   Nasal congestion 09/16/2023   Poor balance 09/16/2023   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   Encounter for screening mammogram for breast cancer 04/19/2022   Bilateral knee pain 10/10/2021   H/O fracture of rib 02/03/2019   Colon cancer screening 07/26/2017   Vitamin D  deficiency 07/26/2017   Prediabetes 07/21/2017   Hyperlipidemia 06/02/2015   Routine general medical examination at a health care facility 06/02/2015   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   Osteopenia 07/15/2014   Estrogen deficiency 06/07/2014   Abnormal breast biopsy 06/07/2014   Encounter for Medicare annual wellness exam 05/24/2013   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   Past Medical History:  Diagnosis Date   Back pain    due to large breasts   Fever blister    GERD (gastroesophageal reflux disease)    No pertinent past medical history    Overweight(278.02)    Past Surgical History:  Procedure Laterality Date   BREAST REDUCTION SURGERY  12/01/13   BUNIONECTOMY  1988   cataract surgery  2010   CESAREAN SECTION     MASS EXCISION  06/08/2011   Procedure: EXCISION MASS;  Surgeon: Amelie Baize., MD;  Location: [facility];  Service: Orthopedics;  Laterality: Right;  excisional biopsy of exostosis right thumb   REDUCTION MAMMAPLASTY Bilateral 2014   TONSILLECTOMY     Social History   Tobacco Use   Smoking status: Former    Current packs/day: 0.00    Types: Cigarettes    Quit date: 06/07/1975    Years since quitting: 48.5   Smokeless tobacco: Never   Tobacco comments:    quit 1976  Substance Use Topics   Alcohol use: Yes    Alcohol/week: 2.0 - 3.0 standard drinks of alcohol    Types: 2 - 3 Glasses of wine per week    Comment: occasional wine/alcohol   Drug use: No   Family History  Problem Relation Age of Onset   Asthma Mother     Cancer Father        brain tumor   Heart disease Maternal Grandfather        MI   Breast cancer Sister    Allergies  Allergen Reactions   Penicillins Other (See Comments)    Unsure; was told 40+ years ago: Has patient had a PCN reaction causing immediate  rash, facial/tongue/throat swelling, SOB or lightheadedness with hypotension: Unknown Has patient had a PCN reaction causing severe rash involving mucus membranes or skin necrosis: Unknown Has patient had a PCN reaction that required hospitalization: Unknown Has patient had a PCN reaction occurring within the last 10 years: No If all of the above answers are NO, then may proceed with Cephalosporin use.    Current Outpatient Medications on File Prior to Visit  Medication Sig Dispense Refill   Calcium Carbonate-Vitamin D  (CALCIUM + D PO) Take 1 capsule by mouth 2 (two) times daily.     Cholecalciferol (VITAMIN D ) 1000 UNITS capsule Take 4,000 Units by mouth daily.     fluticasone  (FLONASE ) 50 MCG/ACT nasal spray Place 2 sprays into both nostrils daily. 16 g 11   Multiple Vitamin (MULTIVITAMIN) tablet Take 1 tablet by mouth daily.     triamcinolone  cream (KENALOG ) 0.1 % APPLY TOPICALLYTO THE AFFECTED AREAS 2 (TWO) TIMES DAILY AS NEEDED for poison ivy 30 g 3   No current facility-administered medications on file prior to visit.    Review of Systems  Constitutional:  Negative for activity change, appetite change, fatigue, fever and unexpected weight change.  HENT:  Negative for congestion, ear pain, rhinorrhea, sinus pressure and sore throat.   Eyes:  Negative for pain, redness and visual disturbance.  Respiratory:  Negative for cough, shortness of breath and wheezing.   Cardiovascular:  Negative for chest pain and palpitations.  Gastrointestinal:  Negative for abdominal pain, blood in stool, constipation and diarrhea.  Endocrine: Negative for polydipsia and polyuria.  Genitourinary:  Negative for dysuria, frequency and urgency.   Musculoskeletal:  Negative for arthralgias, back pain and myalgias.  Skin:  Negative for pallor and rash.  Allergic/Immunologic: Negative for environmental allergies.  Neurological:  Negative for dizziness, syncope and headaches.  Hematological:  Negative for adenopathy. Does not bruise/bleed easily.  Psychiatric/Behavioral:  Negative for confusion, decreased concentration and dysphoric mood. The patient is not nervous/anxious.        Some short term forgetfulness        Objective:   Physical Exam Constitutional:      General: She is not in acute distress.    Appearance: Normal appearance. She is well-developed and normal weight. She is not ill-appearing or diaphoretic.  HENT:     Head: Normocephalic and atraumatic.     Right Ear: Tympanic membrane, ear canal and external ear normal.     Left Ear: Tympanic membrane, ear canal and external ear normal.     Nose: Nose normal. No congestion.     Mouth/Throat:     Mouth: Mucous membranes are moist.     Pharynx: Oropharynx is clear. No posterior oropharyngeal erythema.  Eyes:     General: No scleral icterus.    Extraocular Movements: Extraocular movements intact.     Conjunctiva/sclera: Conjunctivae normal.     Pupils: Pupils are equal, round, and reactive to light.  Neck:     Thyroid : No thyromegaly.     Vascular: No carotid bruit or JVD.  Cardiovascular:     Rate and Rhythm: Normal rate and regular rhythm.     Pulses: Normal pulses.     Heart sounds: Normal heart sounds.     No gallop.  Pulmonary:     Effort: Pulmonary effort is normal. No respiratory distress.     Breath sounds: Normal breath sounds. No wheezing.     Comments: Good air exch Chest:     Chest wall: No tenderness.  Abdominal:     General: Bowel sounds are normal. There is no distension or abdominal bruit.     Palpations: Abdomen is soft. There is no mass.     Tenderness: There is no abdominal tenderness.     Hernia: No hernia is present.  Genitourinary:     Comments: Breast exam: No mass, nodules, thickening, tenderness, bulging, retraction, inflamation, nipple discharge or skin changes noted.  No axillary or clavicular LA.     Baseline dense tissue  Musculoskeletal:        General: No tenderness. Normal range of motion.     Cervical back: Normal range of motion and neck supple. No rigidity. No muscular tenderness.     Right lower leg: No edema.     Left lower leg: No edema.     Comments: No kyphosis   Lymphadenopathy:     Cervical: No cervical adenopathy.  Skin:    General: Skin is warm and dry.     Coloration: Skin is not pale.     Findings: No erythema or rash.     Comments: Solar lentigines diffusely   Neurological:     Mental Status: She is alert. Mental status is at baseline.     Cranial Nerves: No cranial nerve deficit.     Motor: No abnormal muscle tone.     Coordination: Coordination normal.     Gait: Gait normal.     Deep Tendon Reflexes: Reflexes are normal and symmetric. Reflexes normal.  Psychiatric:        Mood and Affect: Mood normal.        Cognition and Memory: Cognition and memory normal.           Assessment & Plan:   Problem List Items Addressed This Visit       Musculoskeletal and Integument   Osteopenia   Will be due for dexa in October [facility] Instructed to call in the fall to set up elsewhere/ she is open to [place]  No fracture  One fall-discussed prevention  Discussed fall prevention, supplements and exercise for bone density  Plans to add some strength training-especially for core and upper body Discussed options         Other   Vitamin D  deficiency   Last vitamin D  Lab Results  Component Value Date   VD25OH 34.72 12/04/2023   Encouraged to continue ca and D plus extra D3 4000 international units daily  For bone and overall health       Routine general medical examination at a health care facility - Primary   Reviewed health habits including  diet and exercise and skin cancer prevention Reviewed appropriate screening tests for age  Also reviewed health mt list, fam hx and immunization status , as well as social and family history   See HPI Labs reviewed and ordered Health Maintenance  Topic Date Due   COVID-19 Vaccine (8 - 2024-25 season) 12/26/2025*   Flu Shot  01/31/2024   Mammogram  08/25/2024   Medicare Annual Wellness Visit  10/27/2024   Pneumonia Vaccine  Completed   DEXA scan (bone density measurement)  Completed   Zoster (Shingles) Vaccine  Completed   HPV Vaccine  Aged Out   Meningitis B Vaccine  Aged Out   DTaP/Tdap/Td vaccine  Discontinued   Colon Cancer Screening  Discontinued   Hepatitis C Screening  Discontinued   Cologuard (Stool DNA test)  Discontinued  *Topic was postponed. The date  shown is not the original due date.    Pending cologuard result  Discussed fall prevention, supplements and exercise for bone density  Continue Q 6 mo breast exams for history of lobular hyperplasia  PHQ 1       Prediabetes   Prediabetes  Lab Results  Component Value Date   HGBA1C 5.7 12/04/2023   HGBA1C 5.7 10/15/2022   HGBA1C 5.8 10/03/2021    disc imp of low glycemic diet and wt loss to prevent DM2  Plans to cut back on ice cream       Poor balance   Encouraged to start working on strength-especially core Discussed plan for exercise -see avs      Nasal congestion   Using flonase  in season       Hyperlipidemia   Disc goals for lipids and reasons to control them Rev last labs with pt Rev low sat fat diet in detail Well controlled with diet  Improved from last year  LDL of 73 and HDL 81.2        Colon cancer screening   Cologuard sample was damaged in the mail   She re did it  Pending result   Last was normal in 2022       Atypical lobular hyperplasia of breast   Gets exam every 6 months Dense tissue  Mammogram utd from 08/2023   -yearly

## 2023-12-11 NOTE — Assessment & Plan Note (Signed)
 Reviewed health habits including diet and exercise and skin cancer prevention Reviewed appropriate screening tests for age  Also reviewed health mt list, fam hx and immunization status , as well as social and family history   See HPI Labs reviewed and ordered Health Maintenance  Topic Date Due   COVID-19 Vaccine (8 - 2024-25 season) 12/26/2025*   Flu Shot  01/31/2024   Mammogram  08/25/2024   Medicare Annual Wellness Visit  10/27/2024   Pneumonia Vaccine  Completed   DEXA scan (bone density measurement)  Completed   Zoster (Shingles) Vaccine  Completed   HPV Vaccine  Aged Out   Meningitis B Vaccine  Aged Out   DTaP/Tdap/Td vaccine  Discontinued   Colon Cancer Screening  Discontinued   Hepatitis C Screening  Discontinued   Cologuard (Stool DNA test)  Discontinued  *Topic was postponed. The date shown is not the original due date.    Pending cologuard result  Discussed fall prevention, supplements and exercise for bone density  Continue Q 6 mo breast exams for history of lobular hyperplasia  PHQ 1

## 2023-12-11 NOTE — Patient Instructions (Addendum)
 You will be due for dexa in October  [facility] no longer does bone density  Call us  in September and we will do order for somewhere else    Stay active Add some strength training to your routine, this is important for bone and brain health and can reduce your risk of falls and help your body use insulin properly and regulate weight  Light weights, exercise bands , and internet videos are a good way to start  Yoga (chair or regular), machines , floor exercises or a gym with machines are also good options  Aim for 3-4 days per week   To prevent diabetes Try to get most of your carbohydrates from produce (with the exception of white potatoes) and whole grains Eat less bread/pasta/rice/snack foods/cereals/sweets and other items from the middle of the grocery store (processed carbs)  For kidney health try and drink more fluids Aim for 60 oz of fluid daily -mostly water

## 2023-12-11 NOTE — Assessment & Plan Note (Signed)
 Last vitamin D  Lab Results  Component Value Date   VD25OH 34.72 12/04/2023   Encouraged to continue ca and D plus extra D3 4000 international units daily  For bone and overall health

## 2023-12-11 NOTE — Assessment & Plan Note (Signed)
 Prediabetes  Lab Results  Component Value Date   HGBA1C 5.7 12/04/2023   HGBA1C 5.7 10/15/2022   HGBA1C 5.8 10/03/2021    disc imp of low glycemic diet and wt loss to prevent DM2  Plans to cut back on ice cream

## 2023-12-16 DIAGNOSIS — Z1211 Encounter for screening for malignant neoplasm of colon: Secondary | ICD-10-CM | POA: Diagnosis not present

## 2023-12-20 LAB — COLOGUARD: COLOGUARD: NEGATIVE

## 2023-12-30 ENCOUNTER — Other Ambulatory Visit: Payer: Self-pay | Admitting: Family Medicine

## 2023-12-30 MED ORDER — TRIAMCINOLONE ACETONIDE 0.1 % EX CREA: TOPICAL_CREAM | CUTANEOUS | 0 refills | Status: AC

## 2023-12-30 NOTE — Telephone Encounter (Signed)
 Last filled in 2023 but note from call ctr says:  *Patient currently has poison ivy*

## 2023-12-30 NOTE — Telephone Encounter (Signed)
 Copied from CRM [id number]. Topic: Clinical - Medication Refill >> Dec 30, 2023 10:38 AM Burnard DEL wrote: Medication: triamcinolone  cream (KENALOG ) 0.1 %  Has the patient contacted their pharmacy? No (Agent: If no, request that the patient contact the pharmacy for the refill. If patient does not wish to contact the pharmacy document the reason why and proceed with request.) (Agent: If yes, when and what did the pharmacy advise?)  This is the patient's preferred pharmacy:  [facility] [phone] [address], [place] - [address] [place] [address] Phone: [phone] Fax: [phone]  Is this the correct pharmacy for this prescription? Yes If no, delete pharmacy and type the correct one.   Has the prescription been filled recently? No  Is the patient out of the medication? Yes  Has the patient been seen for an appointment in the last year OR does the patient have an upcoming appointment? Yes  Can we respond through MyChart? Yes  Agent: Please be advised that Rx refills may take up to 3 business days. We ask that you follow-up with your pharmacy. *Patient currently has poison ivy*

## 2024-01-23 DIAGNOSIS — L821 Other seborrheic keratosis: Secondary | ICD-10-CM | POA: Diagnosis not present

## 2024-01-23 DIAGNOSIS — L820 Inflamed seborrheic keratosis: Secondary | ICD-10-CM | POA: Diagnosis not present

## 2024-01-23 DIAGNOSIS — Z85828 Personal history of other malignant neoplasm of skin: Secondary | ICD-10-CM | POA: Diagnosis not present

## 2024-01-23 DIAGNOSIS — L570 Actinic keratosis: Secondary | ICD-10-CM | POA: Diagnosis not present

## 2024-01-23 DIAGNOSIS — D225 Melanocytic nevi of trunk: Secondary | ICD-10-CM | POA: Diagnosis not present

## 2024-01-23 DIAGNOSIS — L720 Epidermal cyst: Secondary | ICD-10-CM | POA: Diagnosis not present

## 2024-01-23 DIAGNOSIS — D2262 Melanocytic nevi of left upper limb, including shoulder: Secondary | ICD-10-CM | POA: Diagnosis not present

## 2024-05-03 DIAGNOSIS — Z8249 Family history of ischemic heart disease and other diseases of the circulatory system: Secondary | ICD-10-CM | POA: Diagnosis not present

## 2024-05-03 DIAGNOSIS — M8580 Other specified disorders of bone density and structure, unspecified site: Secondary | ICD-10-CM | POA: Diagnosis not present

## 2024-05-03 DIAGNOSIS — Z87891 Personal history of nicotine dependence: Secondary | ICD-10-CM | POA: Diagnosis not present

## 2024-05-03 DIAGNOSIS — M1990 Unspecified osteoarthritis, unspecified site: Secondary | ICD-10-CM | POA: Diagnosis not present

## 2024-06-01 ENCOUNTER — Telehealth: Payer: Self-pay | Admitting: *Deleted

## 2024-06-01 DIAGNOSIS — Z1231 Encounter for screening mammogram for malignant neoplasm of breast: Secondary | ICD-10-CM

## 2024-06-01 NOTE — Telephone Encounter (Signed)
 We do breast exam every 6 months here Does she mean appointment for that?   Mammo due in feb I think

## 2024-06-01 NOTE — Telephone Encounter (Signed)
 Pt wants to go a head and schedule her mammogram, she goes to [facility], I provided pt with their phone #, she will call tomorrow to get it scheduled. She is aware that she's not due to late Feb early March but would like to go ahead and get it scheduled. Please place order (I pend the order)

## 2024-06-01 NOTE — Telephone Encounter (Signed)
The order is in  She can call to schedule 

## 2024-06-01 NOTE — Telephone Encounter (Signed)
 Copied from CRM #[id number]. Topic: General - Other >> Jun 01, 2024 11:33 AM Alfonso HERO wrote: Reason for CRM: patient calling to get orders for a breast exam.

## 2024-10-28 ENCOUNTER — Ambulatory Visit
